# Patient Record
Sex: Female | Born: 1955 | Race: White | Hispanic: No | Smoking: Never smoker
Health system: Southern US, Community
[De-identification: ages and names within clinical notes are randomized; demographics above are authoritative.]

## PROBLEM LIST (undated history)

## (undated) DIAGNOSIS — R51 Headache: Secondary | ICD-10-CM

## (undated) DIAGNOSIS — F329 Major depressive disorder, single episode, unspecified: Secondary | ICD-10-CM

## (undated) DIAGNOSIS — I10 Essential (primary) hypertension: Secondary | ICD-10-CM

## (undated) DIAGNOSIS — E039 Hypothyroidism, unspecified: Secondary | ICD-10-CM

## (undated) DIAGNOSIS — F419 Anxiety disorder, unspecified: Secondary | ICD-10-CM

## (undated) DIAGNOSIS — F319 Bipolar disorder, unspecified: Secondary | ICD-10-CM

## (undated) DIAGNOSIS — Z86718 Personal history of other venous thrombosis and embolism: Secondary | ICD-10-CM

## (undated) DIAGNOSIS — R0602 Shortness of breath: Secondary | ICD-10-CM

## (undated) DIAGNOSIS — M1990 Unspecified osteoarthritis, unspecified site: Secondary | ICD-10-CM

## (undated) DIAGNOSIS — F209 Schizophrenia, unspecified: Secondary | ICD-10-CM

## (undated) DIAGNOSIS — J069 Acute upper respiratory infection, unspecified: Secondary | ICD-10-CM

## (undated) DIAGNOSIS — J189 Pneumonia, unspecified organism: Secondary | ICD-10-CM

## (undated) DIAGNOSIS — E079 Disorder of thyroid, unspecified: Secondary | ICD-10-CM

## (undated) DIAGNOSIS — E7800 Pure hypercholesterolemia, unspecified: Secondary | ICD-10-CM

## (undated) DIAGNOSIS — F32A Depression, unspecified: Secondary | ICD-10-CM

## (undated) DIAGNOSIS — R45851 Suicidal ideations: Secondary | ICD-10-CM

## (undated) DIAGNOSIS — Z5189 Encounter for other specified aftercare: Secondary | ICD-10-CM

## (undated) DIAGNOSIS — IMO0001 Reserved for inherently not codable concepts without codable children: Secondary | ICD-10-CM

## (undated) HISTORY — DX: Essential (primary) hypertension: I10

## (undated) HISTORY — PX: TUBAL LIGATION: SHX77

## (undated) HISTORY — DX: Pure hypercholesterolemia, unspecified: E78.00

## (undated) HISTORY — PX: APPENDECTOMY: SHX54

---

## 2001-09-15 ENCOUNTER — Emergency Department (HOSPITAL_COMMUNITY): Admission: EM | Admit: 2001-09-15 | Discharge: 2001-09-16 | Payer: Self-pay | Admitting: *Deleted

## 2001-09-16 ENCOUNTER — Encounter: Payer: Self-pay | Admitting: *Deleted

## 2001-09-16 ENCOUNTER — Ambulatory Visit (HOSPITAL_COMMUNITY): Admission: RE | Admit: 2001-09-16 | Discharge: 2001-09-16 | Payer: Self-pay | Admitting: *Deleted

## 2002-04-07 ENCOUNTER — Encounter: Payer: Self-pay | Admitting: Family Medicine

## 2002-04-07 ENCOUNTER — Encounter: Admission: RE | Admit: 2002-04-07 | Discharge: 2002-04-07 | Payer: Self-pay | Admitting: Family Medicine

## 2002-05-04 ENCOUNTER — Encounter: Admission: RE | Admit: 2002-05-04 | Discharge: 2002-05-22 | Payer: Self-pay | Admitting: Neurosurgery

## 2002-09-25 ENCOUNTER — Ambulatory Visit (HOSPITAL_COMMUNITY): Admission: RE | Admit: 2002-09-25 | Discharge: 2002-09-25 | Payer: Self-pay | Admitting: Neurology

## 2002-09-25 ENCOUNTER — Encounter: Payer: Self-pay | Admitting: Neurology

## 2006-04-12 ENCOUNTER — Ambulatory Visit: Payer: Self-pay | Admitting: Nurse Practitioner

## 2006-04-15 ENCOUNTER — Ambulatory Visit (HOSPITAL_COMMUNITY): Admission: RE | Admit: 2006-04-15 | Discharge: 2006-04-15 | Payer: Self-pay | Admitting: Family Medicine

## 2006-04-15 ENCOUNTER — Ambulatory Visit: Payer: Self-pay | Admitting: *Deleted

## 2006-04-29 ENCOUNTER — Ambulatory Visit: Payer: Self-pay | Admitting: Nurse Practitioner

## 2006-04-30 ENCOUNTER — Ambulatory Visit (HOSPITAL_COMMUNITY): Admission: RE | Admit: 2006-04-30 | Discharge: 2006-04-30 | Payer: Self-pay | Admitting: Family Medicine

## 2006-05-10 ENCOUNTER — Ambulatory Visit: Payer: Self-pay | Admitting: Nurse Practitioner

## 2006-06-05 ENCOUNTER — Ambulatory Visit: Payer: Self-pay | Admitting: Nurse Practitioner

## 2006-07-09 ENCOUNTER — Ambulatory Visit: Payer: Self-pay | Admitting: Nurse Practitioner

## 2006-07-15 ENCOUNTER — Ambulatory Visit: Payer: Self-pay | Admitting: Internal Medicine

## 2006-07-30 ENCOUNTER — Ambulatory Visit: Payer: Self-pay | Admitting: Nurse Practitioner

## 2007-01-01 ENCOUNTER — Ambulatory Visit: Payer: Self-pay | Admitting: Nurse Practitioner

## 2007-02-06 ENCOUNTER — Ambulatory Visit: Payer: Self-pay | Admitting: Nurse Practitioner

## 2007-02-17 ENCOUNTER — Ambulatory Visit: Payer: Self-pay | Admitting: Internal Medicine

## 2007-02-23 ENCOUNTER — Inpatient Hospital Stay (HOSPITAL_COMMUNITY): Admission: AD | Admit: 2007-02-23 | Discharge: 2007-03-03 | Payer: Self-pay | Admitting: Psychiatry

## 2007-02-23 ENCOUNTER — Ambulatory Visit: Payer: Self-pay | Admitting: Psychiatry

## 2007-02-23 ENCOUNTER — Emergency Department (HOSPITAL_COMMUNITY): Admission: EM | Admit: 2007-02-23 | Discharge: 2007-02-23 | Payer: Self-pay | Admitting: Emergency Medicine

## 2007-02-27 ENCOUNTER — Emergency Department (HOSPITAL_COMMUNITY): Admission: EM | Admit: 2007-02-27 | Discharge: 2007-02-27 | Payer: Self-pay | Admitting: Emergency Medicine

## 2007-04-02 ENCOUNTER — Ambulatory Visit: Payer: Self-pay | Admitting: Internal Medicine

## 2007-05-28 ENCOUNTER — Encounter (INDEPENDENT_AMBULATORY_CARE_PROVIDER_SITE_OTHER): Payer: Self-pay | Admitting: *Deleted

## 2007-06-11 ENCOUNTER — Emergency Department (HOSPITAL_COMMUNITY): Admission: EM | Admit: 2007-06-11 | Discharge: 2007-06-12 | Payer: Self-pay | Admitting: Emergency Medicine

## 2007-06-25 ENCOUNTER — Ambulatory Visit: Payer: Self-pay | Admitting: Internal Medicine

## 2007-07-23 ENCOUNTER — Encounter (INDEPENDENT_AMBULATORY_CARE_PROVIDER_SITE_OTHER): Payer: Self-pay | Admitting: Nurse Practitioner

## 2007-07-23 ENCOUNTER — Ambulatory Visit: Payer: Self-pay | Admitting: Family Medicine

## 2007-07-23 LAB — CONVERTED CEMR LAB
ALT: 14 units/L (ref 0–35)
AST: 15 units/L (ref 0–37)
Albumin: 4.3 g/dL (ref 3.5–5.2)
Basophils Absolute: 0 10*3/uL (ref 0.0–0.1)
Basophils Relative: 0 % (ref 0–1)
CO2: 25 meq/L (ref 19–32)
Calcium: 9.3 mg/dL (ref 8.4–10.5)
Chloride: 104 meq/L (ref 96–112)
Cholesterol: 206 mg/dL — ABNORMAL HIGH (ref 0–200)
Creatinine, Ser: 0.81 mg/dL (ref 0.40–1.20)
Hemoglobin: 12.6 g/dL (ref 12.0–15.0)
Lymphocytes Relative: 41 % (ref 12–46)
MCHC: 31.9 g/dL (ref 30.0–36.0)
Monocytes Absolute: 0.4 10*3/uL (ref 0.1–1.0)
Neutro Abs: 2.6 10*3/uL (ref 1.7–7.7)
Neutrophils Relative %: 49 % (ref 43–77)
Potassium: 4.3 meq/L (ref 3.5–5.3)
RDW: 13.1 % (ref 11.5–15.5)
Sodium: 140 meq/L (ref 135–145)
TSH: 7.034 microintl units/mL — ABNORMAL HIGH (ref 0.350–5.50)
Total CHOL/HDL Ratio: 5.3
Total Protein: 6.9 g/dL (ref 6.0–8.3)

## 2007-08-26 ENCOUNTER — Encounter (INDEPENDENT_AMBULATORY_CARE_PROVIDER_SITE_OTHER): Payer: Self-pay | Admitting: Nurse Practitioner

## 2007-08-26 ENCOUNTER — Ambulatory Visit: Payer: Self-pay | Admitting: Internal Medicine

## 2007-08-26 LAB — CONVERTED CEMR LAB: TSH: 0.254 microintl units/mL — ABNORMAL LOW (ref 0.350–5.50)

## 2007-09-13 ENCOUNTER — Emergency Department (HOSPITAL_COMMUNITY): Admission: EM | Admit: 2007-09-13 | Discharge: 2007-09-13 | Payer: Self-pay | Admitting: Emergency Medicine

## 2007-10-14 ENCOUNTER — Inpatient Hospital Stay (HOSPITAL_COMMUNITY): Admission: RE | Admit: 2007-10-14 | Discharge: 2007-10-21 | Payer: Self-pay | Admitting: Psychiatry

## 2007-10-14 ENCOUNTER — Ambulatory Visit: Payer: Self-pay | Admitting: Psychiatry

## 2007-12-10 ENCOUNTER — Inpatient Hospital Stay (HOSPITAL_COMMUNITY): Admission: RE | Admit: 2007-12-10 | Discharge: 2007-12-19 | Payer: Self-pay | Admitting: Psychiatry

## 2007-12-11 ENCOUNTER — Ambulatory Visit: Payer: Self-pay | Admitting: Psychiatry

## 2007-12-30 ENCOUNTER — Ambulatory Visit: Payer: Self-pay | Admitting: Internal Medicine

## 2007-12-30 ENCOUNTER — Encounter (INDEPENDENT_AMBULATORY_CARE_PROVIDER_SITE_OTHER): Payer: Self-pay | Admitting: Nurse Practitioner

## 2007-12-30 LAB — CONVERTED CEMR LAB: TSH: 0.503 microintl units/mL (ref 0.350–5.50)

## 2008-04-28 ENCOUNTER — Ambulatory Visit: Payer: Self-pay | Admitting: Internal Medicine

## 2009-02-01 ENCOUNTER — Ambulatory Visit: Payer: Self-pay | Admitting: Internal Medicine

## 2009-02-01 ENCOUNTER — Encounter (INDEPENDENT_AMBULATORY_CARE_PROVIDER_SITE_OTHER): Payer: Self-pay | Admitting: Internal Medicine

## 2009-02-01 LAB — CONVERTED CEMR LAB
ALT: 21 units/L (ref 0–35)
Albumin: 4.3 g/dL (ref 3.5–5.2)
Alkaline Phosphatase: 70 units/L (ref 39–117)
Basophils Absolute: 0 10*3/uL (ref 0.0–0.1)
Basophils Relative: 0 % (ref 0–1)
CO2: 24 meq/L (ref 19–32)
Hemoglobin: 12.7 g/dL (ref 12.0–15.0)
Lymphocytes Relative: 29 % (ref 12–46)
MCHC: 34.3 g/dL (ref 30.0–36.0)
Monocytes Relative: 6 % (ref 3–12)
Neutro Abs: 4.5 10*3/uL (ref 1.7–7.7)
Neutrophils Relative %: 62 % (ref 43–77)
Potassium: 4.4 meq/L (ref 3.5–5.3)
RBC: 4.42 M/uL (ref 3.87–5.11)
Sodium: 143 meq/L (ref 135–145)
Total Bilirubin: 0.3 mg/dL (ref 0.3–1.2)
Total Protein: 6.6 g/dL (ref 6.0–8.3)

## 2009-03-30 ENCOUNTER — Emergency Department (HOSPITAL_COMMUNITY): Admission: EM | Admit: 2009-03-30 | Discharge: 2009-04-02 | Payer: Self-pay | Admitting: Emergency Medicine

## 2009-04-02 ENCOUNTER — Inpatient Hospital Stay (HOSPITAL_COMMUNITY): Admission: AD | Admit: 2009-04-02 | Discharge: 2009-04-20 | Payer: Self-pay | Admitting: Psychiatry

## 2009-04-02 ENCOUNTER — Ambulatory Visit: Payer: Self-pay | Admitting: Psychiatry

## 2009-04-06 IMAGING — CR DG CHEST 2V
2 series · 2 of 2 positions shown · non-contrast
Comparison: none

HISTORY: Altered speech and mobility, question stroke, history hypertension

CHEST 2 VIEWS:
No prior study for comparison.
Upper normal heart size.
Normal mediastinal contours and pulmonary vascularity.
Eventration anterior right diaphragm.
No pulmonary infiltrate or pleural effusion.
Minimal peribronchial thickening.
Bones unremarkable.

[w chest pa]
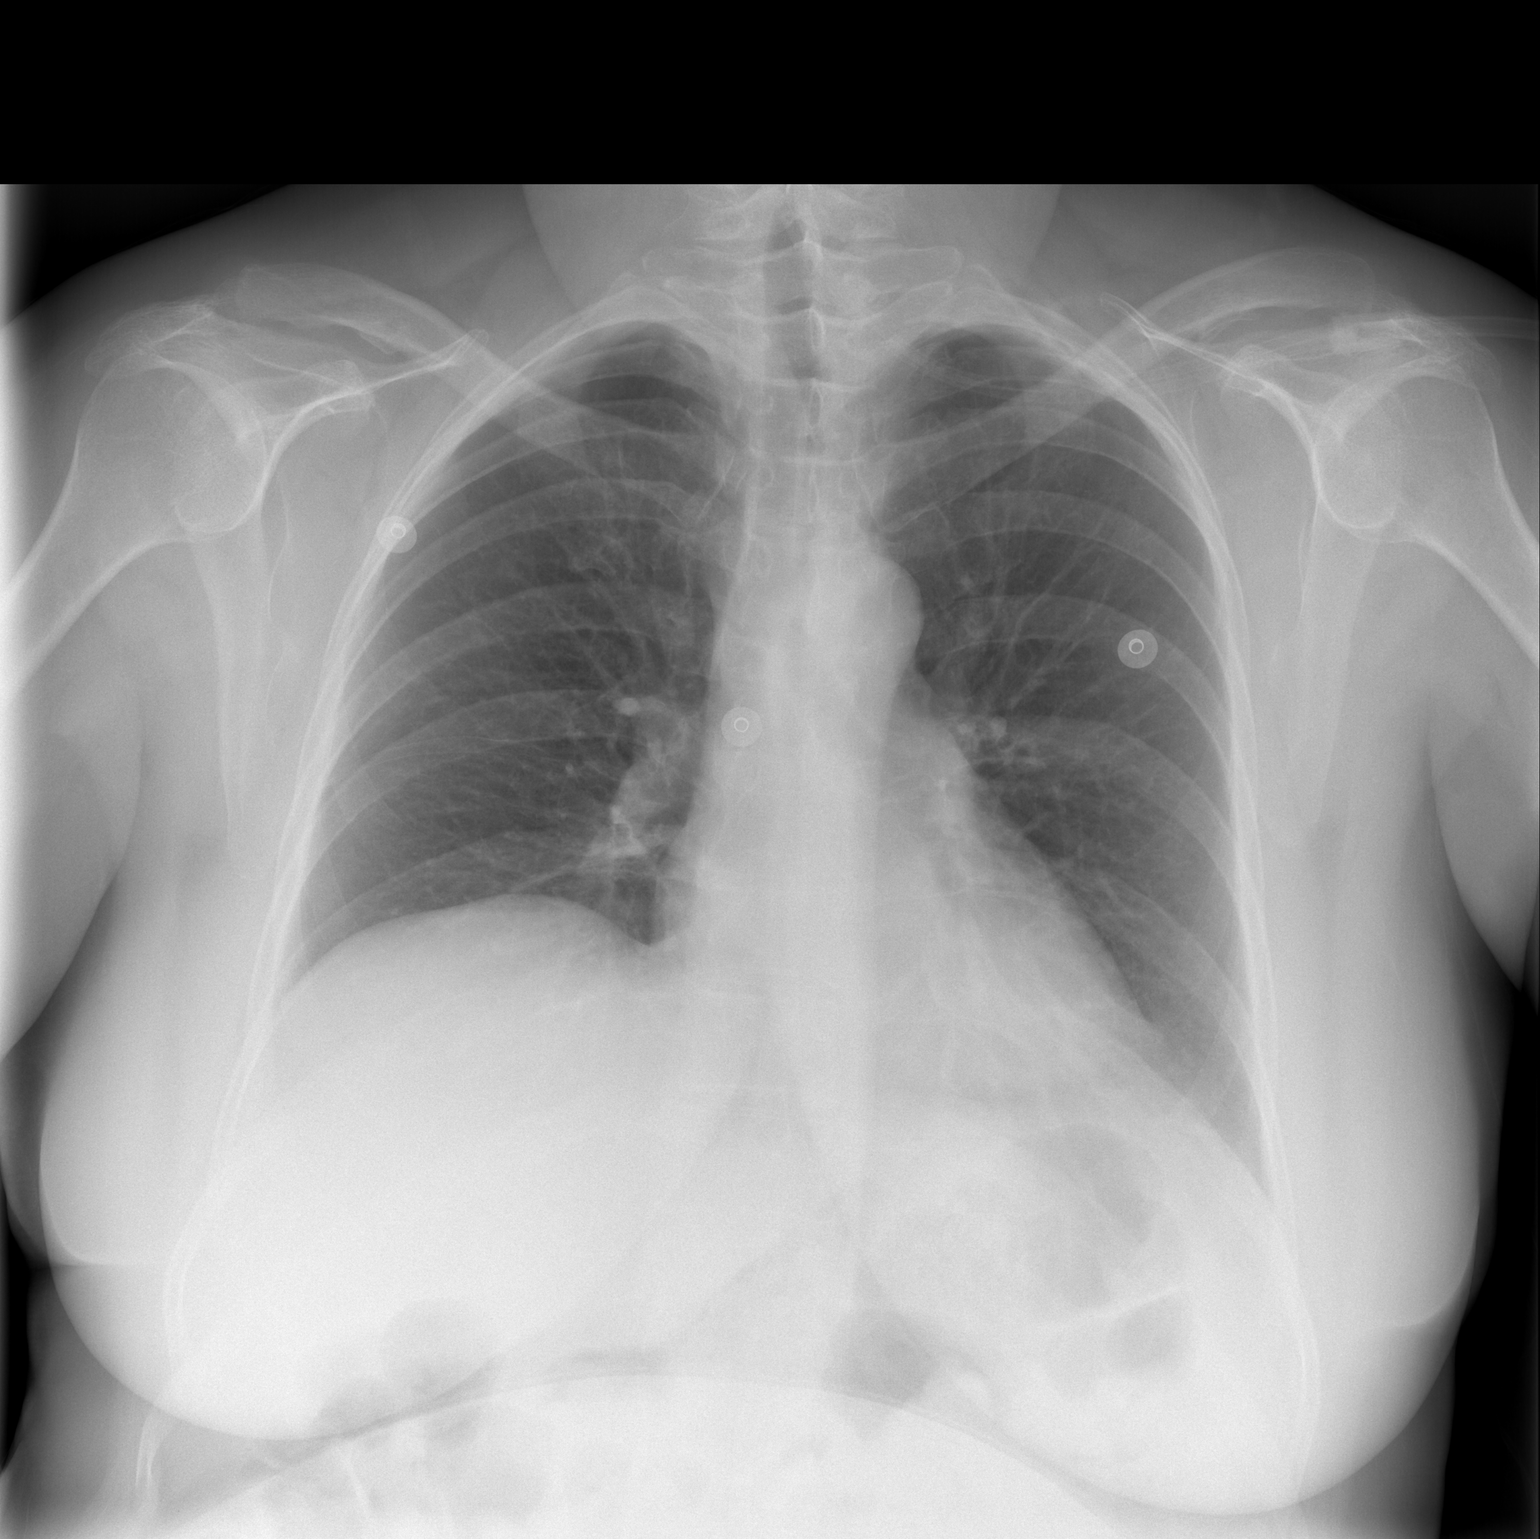

[w chest lat]
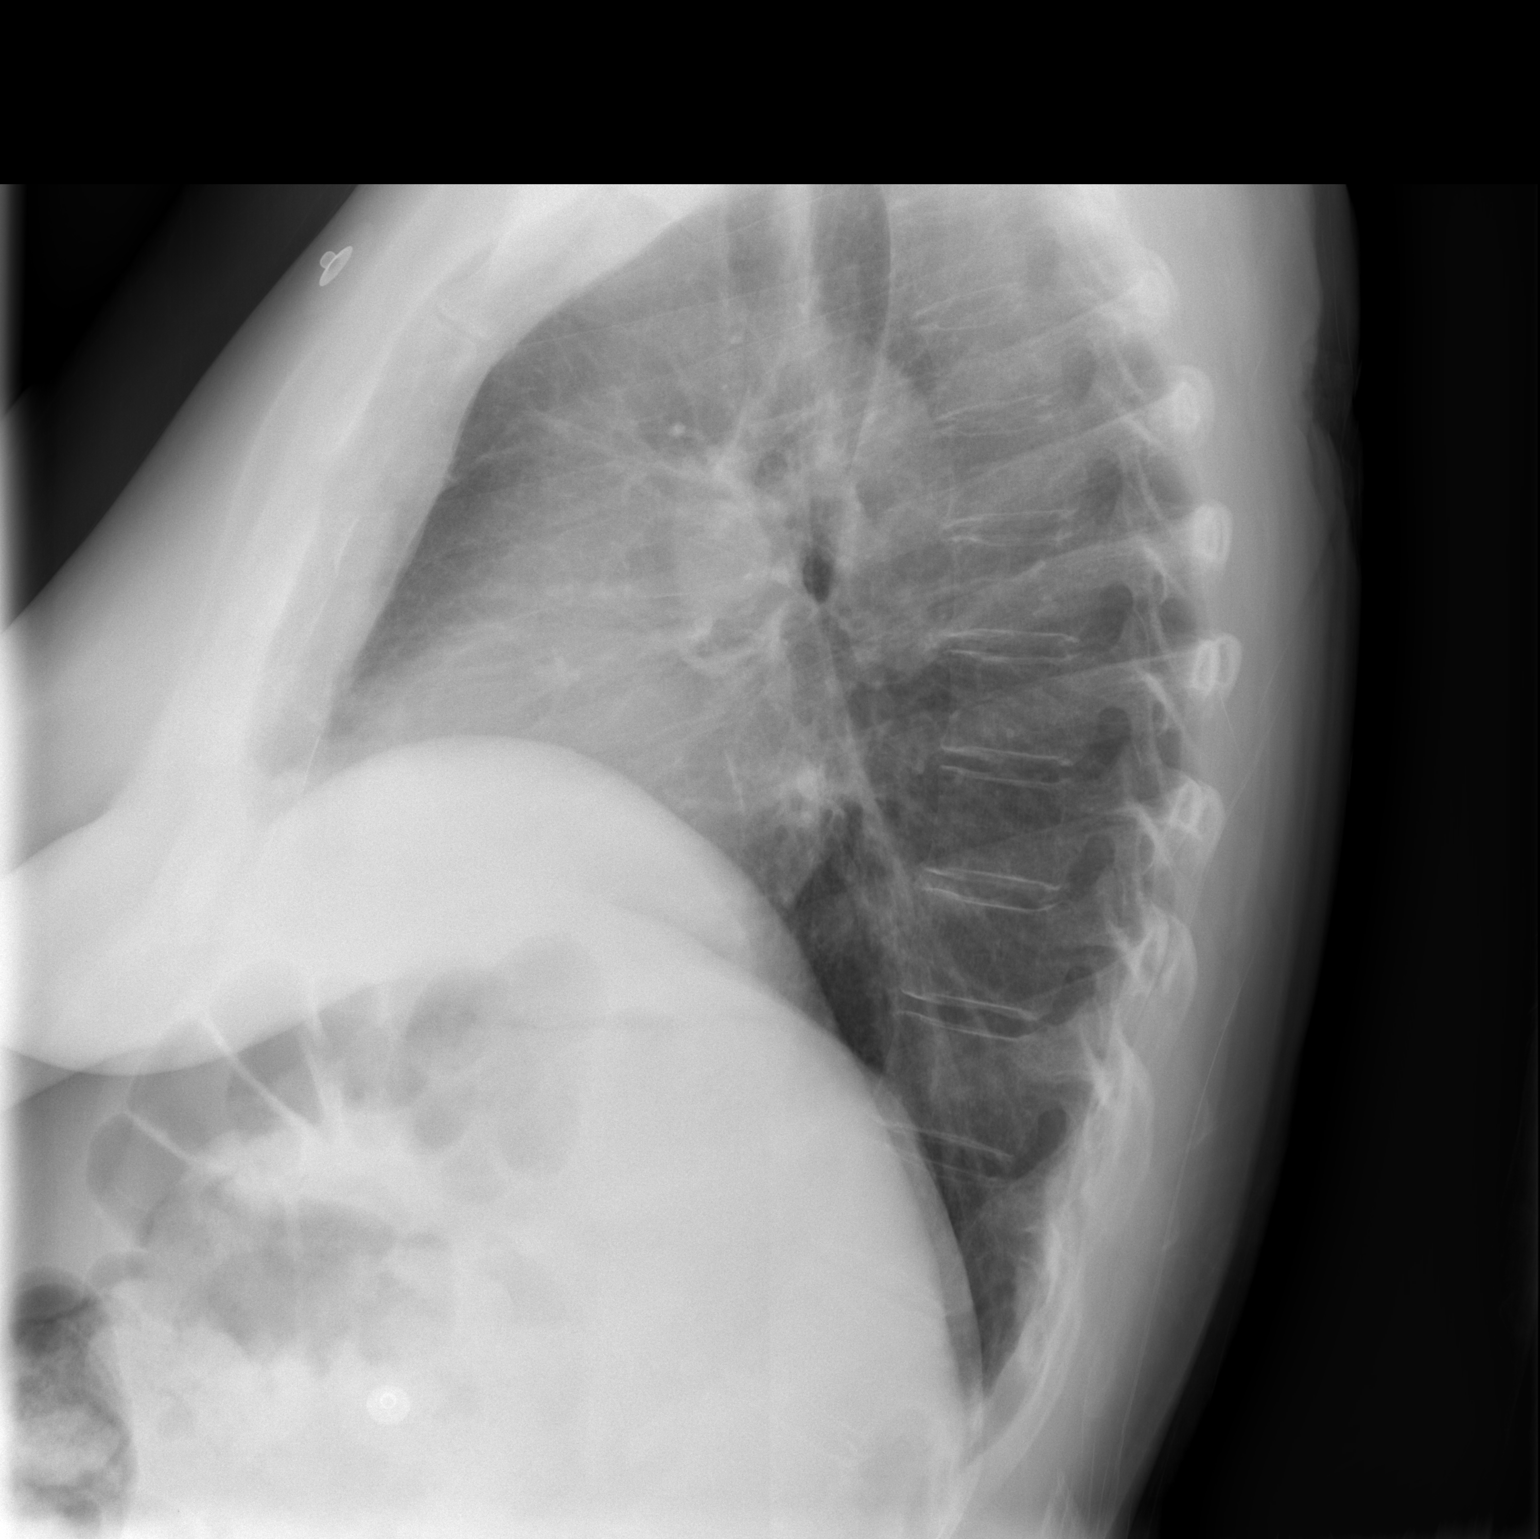

[2 of 2 positions shown; findings below may reference images not displayed]

IMPRESSION: Minimal bronchitic changes.

## 2009-04-25 ENCOUNTER — Ambulatory Visit: Payer: Self-pay | Admitting: Internal Medicine

## 2009-06-27 ENCOUNTER — Encounter (INDEPENDENT_AMBULATORY_CARE_PROVIDER_SITE_OTHER): Payer: Self-pay | Admitting: Internal Medicine

## 2009-06-27 ENCOUNTER — Ambulatory Visit: Payer: Self-pay | Admitting: Internal Medicine

## 2009-08-30 ENCOUNTER — Encounter (INDEPENDENT_AMBULATORY_CARE_PROVIDER_SITE_OTHER): Payer: Self-pay | Admitting: Internal Medicine

## 2009-08-30 ENCOUNTER — Ambulatory Visit: Payer: Self-pay | Admitting: Internal Medicine

## 2009-08-30 LAB — CONVERTED CEMR LAB: TSH: 8.641 microintl units/mL — ABNORMAL HIGH (ref 0.350–4.500)

## 2009-09-19 ENCOUNTER — Ambulatory Visit: Payer: Self-pay | Admitting: Internal Medicine

## 2009-09-19 LAB — CONVERTED CEMR LAB: TSH: 2.271 microintl units/mL (ref 0.350–4.500)

## 2009-11-07 ENCOUNTER — Other Ambulatory Visit: Payer: Self-pay

## 2009-11-07 ENCOUNTER — Other Ambulatory Visit: Payer: Self-pay | Admitting: Emergency Medicine

## 2009-11-08 ENCOUNTER — Inpatient Hospital Stay (HOSPITAL_COMMUNITY): Admission: RE | Admit: 2009-11-08 | Discharge: 2009-11-18 | Payer: Self-pay | Admitting: Psychiatry

## 2009-11-08 ENCOUNTER — Other Ambulatory Visit: Payer: Self-pay | Admitting: Emergency Medicine

## 2009-11-08 ENCOUNTER — Ambulatory Visit: Payer: Self-pay | Admitting: Psychiatry

## 2009-12-01 ENCOUNTER — Other Ambulatory Visit: Payer: Self-pay

## 2009-12-01 ENCOUNTER — Ambulatory Visit: Payer: Self-pay | Admitting: Psychiatry

## 2009-12-01 ENCOUNTER — Inpatient Hospital Stay (HOSPITAL_COMMUNITY): Admission: AD | Admit: 2009-12-01 | Discharge: 2009-12-12 | Payer: Self-pay | Admitting: Psychiatry

## 2010-05-02 ENCOUNTER — Other Ambulatory Visit: Payer: Self-pay | Admitting: Emergency Medicine

## 2010-05-02 ENCOUNTER — Ambulatory Visit: Payer: Self-pay | Admitting: Psychiatry

## 2010-05-03 ENCOUNTER — Inpatient Hospital Stay (HOSPITAL_COMMUNITY): Admission: RE | Admit: 2010-05-03 | Discharge: 2010-05-11 | Payer: Self-pay | Admitting: Psychiatry

## 2010-05-11 ENCOUNTER — Other Ambulatory Visit: Payer: Self-pay | Admitting: Emergency Medicine

## 2010-05-12 ENCOUNTER — Inpatient Hospital Stay (HOSPITAL_COMMUNITY): Admission: EM | Admit: 2010-05-12 | Discharge: 2010-05-14 | Payer: Self-pay | Admitting: Psychiatry

## 2010-05-14 ENCOUNTER — Inpatient Hospital Stay (HOSPITAL_COMMUNITY): Admission: EM | Admit: 2010-05-14 | Discharge: 2010-05-19 | Payer: Self-pay | Admitting: Emergency Medicine

## 2010-05-16 ENCOUNTER — Ambulatory Visit: Payer: Self-pay | Admitting: Psychiatry

## 2010-06-07 ENCOUNTER — Inpatient Hospital Stay (HOSPITAL_COMMUNITY): Admission: EM | Admit: 2010-06-07 | Discharge: 2010-06-09 | Payer: Self-pay | Admitting: Emergency Medicine

## 2010-06-22 ENCOUNTER — Ambulatory Visit (HOSPITAL_COMMUNITY): Admission: RE | Admit: 2010-06-22 | Discharge: 2010-06-22 | Payer: Self-pay | Admitting: Internal Medicine

## 2010-06-25 ENCOUNTER — Emergency Department (HOSPITAL_COMMUNITY): Admission: EM | Admit: 2010-06-25 | Discharge: 2010-06-25 | Payer: Self-pay | Admitting: Emergency Medicine

## 2010-06-29 ENCOUNTER — Ambulatory Visit (HOSPITAL_COMMUNITY): Admission: RE | Admit: 2010-06-29 | Discharge: 2010-06-29 | Payer: Self-pay | Admitting: Internal Medicine

## 2010-09-07 ENCOUNTER — Ambulatory Visit (HOSPITAL_COMMUNITY)
Admission: RE | Admit: 2010-09-07 | Discharge: 2010-09-07 | Payer: Self-pay | Source: Home / Self Care | Attending: Internal Medicine | Admitting: Internal Medicine

## 2010-11-20 NOTE — H&P (Signed)
NAME:  Cheryl [name], Cheryl [name]                   ACCOUNT NO.:  [id number]  MEDICAL RECORD NO.:  [id number]          PATIENT TYPE:  EMS  LOCATION:  ED                           FACILITY:  [facility]  PHYSICIAN:  Donalynn Furlong, MD      DATE OF BIRTH:  10-Jun-1956  DATE OF ADMISSION:  05/14/2010 DATE OF DISCHARGE:                             HISTORY & PHYSICAL  PRIMARY CARE PROVIDER:  Unknown.  ADMITTING TEAM:  [facility] hospitalist Team #1.  CHIEF COMPLAINT:  Hypoxia, shortness of breath, fever.  HISTORY OF PRESENT ILLNESS:  Ms. Tortorelli is a transfer from [facility].  She is in [facility] for suicidal ideation, depression, auditory hallucination.  She was found to be hypoxic today afternoon with low-grade fever and chills.  She was brought to emergency room.  The patient mentions that she also had some cough.  She denies any sputum production and she denies any chest pain. She also had mild shortness of breath.  The patient denies any eye problems, ear problems.  She has headache.  She denies any gastrointestinal, genitourinary symptoms at this time.  Past medical history includes hypothyroidism, back pain, auditory hallucination, depression, suicidal ideation.  PAST SURGICAL HISTORY:  As per past medical history.  SOCIAL HISTORY:  The patient lives with her boyfriend in [place].  No recent alcohol, drug, or tobacco use.  FAMILY HISTORY:  Nothing contributory to the current problem.  REVIEW OF SYSTEMS:  Positive as per HPI, otherwise negative review of systems done for 14 systems.  Home medication list from [facility] includes __________, olanzapine, levothyroxine, ibuprofen, bupropion SR, Celexa, Claritin, docusate, gabapentin, trazodone.  Allergies to medications include SULFA.  PHYSICAL EXAM:  VITAL SIGNS:  As per chart, pulse 120 per minute, respirations 18 per minute, temperature 99.8 Fahrenheit, oxygen saturation 93% on 2 liters  oxygen. GENERAL:  Alert, oriented x3.  Lying in bed in moderate distress. CARDIOVASCULAR:  S1, S2 regular.  The patient is tachycardic. LUNGS:  Clear to auscultation on the left side there are some crackles on the right side. ABDOMEN:  Nontender, nondistended.  Bowel sounds present. EXTREMITIES:  No clubbing, cyanosis, or edema.  __________ in all 4 extremities. HEAD:  Normocephalic, atraumatic. EYES:  Pupils equally light and accommodation.  Extraocular muscles intact. __________ dry.  No thrush noted. NECK:  No thyromegaly or JVD. SKIN:  No rash or bruise. NEUROLOGICAL:  Intact cranial nerves, muscular strength, sensation, reflexes.  Lab work done in the emergency room today shows EKG with sinus tachycardia and no acute ST-T changes. CT chest PE protocol shows right upper and lower lobe infiltrate and inflammatory lymph nodes in mediastinum.  Urinalysis unremarkable for infection.  Basic metabolic panel shows glucose 132.  __________ 1.08. CBC with differential shows a hemoglobin 12.0.  Chest x-ray with right lower lung base opacity with atelectasis and/or pneumonia. CT head without contrast unremarkable.  ASSESSMENT AND PLAN: 1. Right lung upper and lower lobe pneumonia most likely hospital-     acquired. 2. Suicidal ideation. 3. Depression. 4. Auditory hallucination. 5. Hypothyroidism. 6. Back pain.  PLAN:  Will admit patient with a diagnosis of pneumonia, suicidal attempt on telemetry bed under [facility].  Will keep her full code.  Will check vital signs q.4 h.  We will keep her on oxygen to keep her saturation more than 90.  We will put one-on-one sitter.  We will provide her heart-healthy diet.  Will check CBC, CMP, cardiac enzymes, urinalysis, urine culture, blood culture, HIV antibody, urine Strep pneumo antigen, urine Legionella antigen, sputum Gram stain and culture. We will provide normal saline 50 mL per hour for hydration.  Will provide Lovenox for  DVT prophylaxis.  Will provide Zofran p.r.n. for nausea, vomiting.  Will provide albuterol ipratropium q.6 h. and p.r.n. for any shortness of breath.  We will provide Tylenol p.r.n. for fever. Will provide Lortab p.r.n. for pain.  Will start piperacillin/tazobactam and vancomycin for pneumonia treatment at this time.  The patient will be continued on all psych meds.     Donalynn Furlong, MD    TVP/MEDQ  D:  05/14/2010  T:  05/14/2010  Job:  [id number]  Electronically Signed by Virginia Rochester M.D. on 11/20/2010 12:07:25 PM

## 2010-11-23 LAB — POCT I-STAT, CHEM 8
BUN: 18 mg/dL (ref 6–23)
Chloride: 105 mEq/L (ref 96–112)
Creatinine, Ser: 1.2 mg/dL (ref 0.4–1.2)
Hemoglobin: 12.9 g/dL (ref 12.0–15.0)
Potassium: 4.6 mEq/L (ref 3.5–5.1)
Sodium: 138 mEq/L (ref 135–145)

## 2010-11-23 LAB — CK TOTAL AND CKMB (NOT AT ARMC)
CK, MB: 0.8 ng/mL (ref 0.3–4.0)
CK, MB: 1.2 ng/mL (ref 0.3–4.0)
CK, MB: 0.8 ng/mL (ref 0.3–4.0)
Relative Index: INVALID (ref 0.0–2.5)
Total CK: 48 U/L (ref 7–177)
Total CK: 49 U/L (ref 7–177)
Total CK: 53 U/L (ref 7–177)

## 2010-11-23 LAB — CBC
HCT: 31 % — ABNORMAL LOW (ref 36.0–46.0)
HCT: 35.4 % — ABNORMAL LOW (ref 36.0–46.0)
Hemoglobin: 11.7 g/dL — ABNORMAL LOW (ref 12.0–15.0)
Hemoglobin: 11.4 g/dL — ABNORMAL LOW (ref 12.0–15.0)
Hemoglobin: 12 g/dL (ref 12.0–15.0)
MCH: 28.6 pg (ref 26.0–34.0)
MCH: 28.7 pg (ref 26.0–34.0)
MCHC: 33.7 g/dL (ref 30.0–36.0)
MCHC: 34 g/dL (ref 30.0–36.0)
MCV: 85.8 fL (ref 78.0–100.0)
MCV: 84.5 fL (ref 78.0–100.0)
MCV: 84.8 fL (ref 78.0–100.0)
MCV: 85.2 fL (ref 78.0–100.0)
MCV: 85.3 fL (ref 78.0–100.0)
Platelets: 263 10*3/uL (ref 150–400)
Platelets: 266 10*3/uL (ref 150–400)
Platelets: 278 10*3/uL (ref 150–400)
Platelets: 294 10*3/uL (ref 150–400)
RBC: 4.09 MIL/uL (ref 3.87–5.11)
RBC: 3.47 MIL/uL — ABNORMAL LOW (ref 3.87–5.11)
RBC: 4.16 MIL/uL (ref 3.87–5.11)
RDW: 14.4 % (ref 11.5–15.5)
RDW: 13.4 % (ref 11.5–15.5)
RDW: 13.7 % (ref 11.5–15.5)
WBC: 12 10*3/uL — ABNORMAL HIGH (ref 4.0–10.5)
WBC: 5.2 10*3/uL (ref 4.0–10.5)
WBC: 6 10*3/uL (ref 4.0–10.5)

## 2010-11-23 LAB — HEPATIC FUNCTION PANEL
ALT: 34 U/L (ref 0–35)
Alkaline Phosphatase: 62 U/L (ref 39–117)
Bilirubin, Direct: <0.1 mg/dL (ref 0.0–0.3)

## 2010-11-23 LAB — DIFFERENTIAL
Basophils Absolute: 0 10*3/uL (ref 0.0–0.1)
Basophils Absolute: 0 10*3/uL (ref 0.0–0.1)
Basophils Relative: 0 % (ref 0–1)
Basophils Relative: 0 % (ref 0–1)
Eosinophils Relative: 1 % (ref 0–5)
Eosinophils Relative: 3 % (ref 0–5)
Eosinophils Relative: 5 % (ref 0–5)
Lymphocytes Relative: 15 % (ref 12–46)
Lymphocytes Relative: 8 % — ABNORMAL LOW (ref 12–46)
Lymphocytes Relative: 16 % (ref 12–46)
Lymphocytes Relative: 28 % (ref 12–46)
Lymphs Abs: 1.8 10*3/uL (ref 0.7–4.0)
Lymphs Abs: 0.8 10*3/uL (ref 0.7–4.0)
Lymphs Abs: 1.6 10*3/uL (ref 0.7–4.0)
Monocytes Absolute: 0.6 10*3/uL (ref 0.1–1.0)
Monocytes Absolute: 0.3 10*3/uL (ref 0.1–1.0)
Monocytes Relative: 4 % (ref 3–12)
Monocytes Relative: 5 % (ref 3–12)
Neutro Abs: 9.4 10*3/uL — ABNORMAL HIGH (ref 1.7–7.7)
Neutro Abs: 3.4 10*3/uL (ref 1.7–7.7)
Neutro Abs: 3.8 10*3/uL (ref 1.7–7.7)
Neutrophils Relative %: 87 % — ABNORMAL HIGH (ref 43–77)

## 2010-11-23 LAB — BASIC METABOLIC PANEL
BUN: 11 mg/dL (ref 6–23)
BUN: 6 mg/dL (ref 6–23)
CO2: 30 mEq/L (ref 19–32)
Chloride: 102 mEq/L (ref 96–112)
Chloride: 106 mEq/L (ref 96–112)
Chloride: 107 mEq/L (ref 96–112)
Creatinine, Ser: 0.89 mg/dL (ref 0.4–1.2)
Creatinine, Ser: 0.91 mg/dL (ref 0.4–1.2)
GFR calc Af Amer: >60 mL/min (ref >60)
GFR calc Af Amer: >60 mL/min (ref >60)
GFR calc non Af Amer: >60 mL/min (ref >60)
GFR calc non Af Amer: >60 mL/min (ref >60)
Glucose, Bld: 121 mg/dL — ABNORMAL HIGH (ref 70–99)
Potassium: 4.1 mEq/L (ref 3.5–5.1)
Potassium: 4.2 mEq/L (ref 3.5–5.1)
Potassium: 4.3 mEq/L (ref 3.5–5.1)
Sodium: 139 mEq/L (ref 135–145)

## 2010-11-23 LAB — D-DIMER, QUANTITATIVE: D-Dimer, Quant: 1.08 ug/mL-FEU — ABNORMAL HIGH (ref 0.00–0.48)

## 2010-11-23 LAB — EXPECTORATED SPUTUM ASSESSMENT W GRAM STAIN, RFLX TO RESP C

## 2010-11-23 LAB — COMPREHENSIVE METABOLIC PANEL
AST: 51 U/L — ABNORMAL HIGH (ref 0–37)
Albumin: 3.2 g/dL — ABNORMAL LOW (ref 3.5–5.2)
Alkaline Phosphatase: 72 U/L (ref 39–117)
BUN: 13 mg/dL (ref 6–23)
BUN: 11 mg/dL (ref 6–23)
CO2: 25 mEq/L (ref 19–32)
Calcium: 8.8 mg/dL (ref 8.4–10.5)
Creatinine, Ser: 0.88 mg/dL (ref 0.4–1.2)
GFR calc Af Amer: >60 mL/min (ref >60)
GFR calc non Af Amer: >60 mL/min (ref >60)
Potassium: 3.9 mEq/L (ref 3.5–5.1)
Total Bilirubin: 0.3 mg/dL (ref 0.3–1.2)
Total Protein: 6.2 g/dL (ref 6.0–8.3)

## 2010-11-23 LAB — LEGIONELLA ANTIGEN, URINE

## 2010-11-23 LAB — CULTURE, BLOOD (ROUTINE X 2)
Culture  Setup Time: 201109290353
Culture  Setup Time: 201109290353
Culture: NO GROWTH
Culture: NO GROWTH

## 2010-11-23 LAB — CULTURE, RESPIRATORY W GRAM STAIN

## 2010-11-23 LAB — URINE CULTURE: Special Requests: POSITIVE

## 2010-11-23 LAB — URINE MICROSCOPIC-ADD ON

## 2010-11-23 LAB — URINALYSIS, ROUTINE W REFLEX MICROSCOPIC
Glucose, UA: 100 mg/dL — AB
Glucose, UA: 100 mg/dL — AB
Hgb urine dipstick: NEGATIVE
Hgb urine dipstick: NEGATIVE
Ketones, ur: NEGATIVE mg/dL
Nitrite: NEGATIVE
Specific Gravity, Urine: 1.015 (ref 1.005–1.030)
Specific Gravity, Urine: 1.013 (ref 1.005–1.030)
Specific Gravity, Urine: 1.019 (ref 1.005–1.030)
Urobilinogen, UA: 1 mg/dL (ref 0.0–1.0)
pH: 7 (ref 5.0–8.0)

## 2010-11-23 LAB — HIV ANTIBODY (ROUTINE TESTING W REFLEX): HIV: NONREACTIVE

## 2010-11-23 LAB — TROPONIN I
Troponin I: 0.01 ng/mL (ref 0.00–0.06)
Troponin I: 0.01 ng/mL (ref 0.00–0.06)

## 2010-11-23 LAB — RAPID URINE DRUG SCREEN, HOSP PERFORMED: Barbiturates: NOT DETECTED

## 2010-11-23 LAB — HEMOGLOBIN: Hemoglobin: 10 g/dL — ABNORMAL LOW (ref 12.0–15.0)

## 2010-11-23 LAB — GLUCOSE, CAPILLARY: Glucose-Capillary: 180 mg/dL — ABNORMAL HIGH (ref 70–99)

## 2010-11-24 LAB — RAPID URINE DRUG SCREEN, HOSP PERFORMED
Barbiturates: NOT DETECTED
Benzodiazepines: POSITIVE — AB

## 2010-11-24 LAB — HEPATIC FUNCTION PANEL
ALT: 38 U/L — ABNORMAL HIGH (ref 0–35)
AST: 31 U/L (ref 0–37)
Alkaline Phosphatase: 69 U/L (ref 39–117)
Indirect Bilirubin: 0.5 mg/dL (ref 0.3–0.9)
Total Bilirubin: 0.6 mg/dL (ref 0.3–1.2)
Total Protein: 6.5 g/dL (ref 6.0–8.3)

## 2010-11-24 LAB — CBC
HCT: 40 % (ref 36.0–46.0)
Hemoglobin: 13.5 g/dL (ref 12.0–15.0)
RBC: 4.72 MIL/uL (ref 3.87–5.11)
WBC: 7.9 10*3/uL (ref 4.0–10.5)

## 2010-11-24 LAB — URINALYSIS, ROUTINE W REFLEX MICROSCOPIC
Glucose, UA: NEGATIVE mg/dL
Ketones, ur: NEGATIVE mg/dL
Protein, ur: NEGATIVE mg/dL

## 2010-11-24 LAB — BASIC METABOLIC PANEL
GFR calc non Af Amer: >60 mL/min (ref >60)
Glucose, Bld: 108 mg/dL — ABNORMAL HIGH (ref 70–99)
Potassium: 4 mEq/L (ref 3.5–5.1)
Sodium: 139 mEq/L (ref 135–145)

## 2010-11-24 LAB — TSH
TSH: 8.318 u[IU]/mL — ABNORMAL HIGH (ref 0.350–4.500)
TSH: 9.402 u[IU]/mL — ABNORMAL HIGH (ref 0.350–4.500)

## 2010-11-24 LAB — URINE MICROSCOPIC-ADD ON

## 2010-11-24 LAB — ETHANOL: Alcohol, Ethyl (B): 5 mg/dL (ref 0–10)

## 2010-11-24 LAB — DIFFERENTIAL
Lymphocytes Relative: 29 % (ref 12–46)
Monocytes Absolute: 0.5 10*3/uL (ref 0.1–1.0)
Monocytes Relative: 6 % (ref 3–12)
Neutro Abs: 4.9 10*3/uL (ref 1.7–7.7)

## 2010-11-24 LAB — LIPID PANEL: VLDL: 52 mg/dL — ABNORMAL HIGH (ref 0–40)

## 2010-11-24 LAB — ACETAMINOPHEN LEVEL: Acetaminophen (Tylenol), Serum: <10 ug/mL — ABNORMAL LOW (ref 10–30)

## 2010-11-29 LAB — CBC
Hemoglobin: 11.7 g/dL — ABNORMAL LOW (ref 12.0–15.0)
MCHC: 33.6 g/dL (ref 30.0–36.0)
MCV: 84 fL (ref 78.0–100.0)
RDW: 14 % (ref 11.5–15.5)

## 2010-11-29 LAB — DIFFERENTIAL
Eosinophils Absolute: 0.1 10*3/uL (ref 0.0–0.7)
Lymphocytes Relative: 20 % (ref 12–46)
Lymphs Abs: 2.6 10*3/uL (ref 0.7–4.0)
Monocytes Relative: 6 % (ref 3–12)
Neutrophils Relative %: 73 % (ref 43–77)

## 2010-11-29 LAB — RAPID URINE DRUG SCREEN, HOSP PERFORMED
Amphetamines: NOT DETECTED
Cocaine: NOT DETECTED
Opiates: NOT DETECTED
Tetrahydrocannabinol: NOT DETECTED

## 2010-11-29 LAB — COMPREHENSIVE METABOLIC PANEL
ALT: 26 U/L (ref 0–35)
Calcium: 8.7 mg/dL (ref 8.4–10.5)
Creatinine, Ser: 1.13 mg/dL (ref 0.4–1.2)
GFR calc Af Amer: >60 mL/min (ref >60)
Glucose, Bld: 134 mg/dL — ABNORMAL HIGH (ref 70–99)
Sodium: 142 mEq/L (ref 135–145)
Total Protein: 6.4 g/dL (ref 6.0–8.3)

## 2010-11-29 LAB — T4, FREE: Free T4: 1.3 ng/dL (ref 0.80–1.80)

## 2010-11-29 LAB — TSH: TSH: 3.844 u[IU]/mL (ref 0.350–4.500)

## 2010-12-03 LAB — CBC
MCHC: 32.2 g/dL (ref 30.0–36.0)
MCV: 84.2 fL (ref 78.0–100.0)
Platelets: 317 10*3/uL (ref 150–400)
RBC: 4.43 MIL/uL (ref 3.87–5.11)
WBC: 7.1 10*3/uL (ref 4.0–10.5)

## 2010-12-03 LAB — COMPREHENSIVE METABOLIC PANEL
ALT: 31 U/L (ref 0–35)
AST: 25 U/L (ref 0–37)
Albumin: 3.8 g/dL (ref 3.5–5.2)
CO2: 26 mEq/L (ref 19–32)
Calcium: 8.8 mg/dL (ref 8.4–10.5)
Chloride: 106 mEq/L (ref 96–112)
Creatinine, Ser: 0.93 mg/dL (ref 0.4–1.2)
GFR calc Af Amer: >60 mL/min (ref >60)
Sodium: 139 mEq/L (ref 135–145)

## 2010-12-03 LAB — RAPID URINE DRUG SCREEN, HOSP PERFORMED
Amphetamines: NOT DETECTED
Benzodiazepines: NOT DETECTED
Tetrahydrocannabinol: NOT DETECTED

## 2010-12-03 LAB — URINALYSIS, ROUTINE W REFLEX MICROSCOPIC
Glucose, UA: NEGATIVE mg/dL
Hgb urine dipstick: NEGATIVE
Ketones, ur: NEGATIVE mg/dL
Protein, ur: NEGATIVE mg/dL
pH: 5.5 (ref 5.0–8.0)

## 2010-12-03 LAB — DIFFERENTIAL
Eosinophils Absolute: 0.3 10*3/uL (ref 0.0–0.7)
Eosinophils Relative: 4 % (ref 0–5)
Lymphocytes Relative: 24 % (ref 12–46)
Lymphs Abs: 1.7 10*3/uL (ref 0.7–4.0)
Monocytes Absolute: 0.7 10*3/uL (ref 0.1–1.0)

## 2010-12-03 LAB — URINE CULTURE
Colony Count: NO GROWTH
Special Requests: NEGATIVE

## 2010-12-03 LAB — URINE MICROSCOPIC-ADD ON

## 2010-12-17 LAB — COMPREHENSIVE METABOLIC PANEL
Alkaline Phosphatase: 69 U/L (ref 39–117)
BUN: 9 mg/dL (ref 6–23)
Chloride: 108 mEq/L (ref 96–112)
Glucose, Bld: 104 mg/dL — ABNORMAL HIGH (ref 70–99)
Potassium: 4.2 mEq/L (ref 3.5–5.1)
Total Bilirubin: 0.5 mg/dL (ref 0.3–1.2)
Total Protein: 6.6 g/dL (ref 6.0–8.3)

## 2010-12-17 LAB — DIFFERENTIAL
Basophils Absolute: 0 10*3/uL (ref 0.0–0.1)
Basophils Relative: 1 % (ref 0–1)
Neutro Abs: 4.3 10*3/uL (ref 1.7–7.7)
Neutrophils Relative %: 61 % (ref 43–77)

## 2010-12-17 LAB — URINALYSIS, ROUTINE W REFLEX MICROSCOPIC
Ketones, ur: NEGATIVE mg/dL
Nitrite: NEGATIVE
Specific Gravity, Urine: 1.004 — ABNORMAL LOW (ref 1.005–1.030)
pH: 6 (ref 5.0–8.0)

## 2010-12-17 LAB — SALICYLATE LEVEL: Salicylate Lvl: <4 mg/dL (ref 2.8–20.0)

## 2010-12-17 LAB — RAPID URINE DRUG SCREEN, HOSP PERFORMED
Barbiturates: NOT DETECTED
Cocaine: NOT DETECTED
Opiates: NOT DETECTED

## 2010-12-17 LAB — TSH: TSH: 0.077 u[IU]/mL — ABNORMAL LOW (ref 0.350–4.500)

## 2010-12-17 LAB — ETHANOL: Alcohol, Ethyl (B): <5 mg/dL (ref 0–10)

## 2010-12-17 LAB — CBC
HCT: 38.5 % (ref 36.0–46.0)
Hemoglobin: 12.9 g/dL (ref 12.0–15.0)
RDW: 12.9 % (ref 11.5–15.5)

## 2010-12-17 LAB — ACETAMINOPHEN LEVEL: Acetaminophen (Tylenol), Serum: <10 ug/mL — ABNORMAL LOW (ref 10–30)

## 2011-01-23 NOTE — H&P (Signed)
NAME:  Cheryl [name], Cheryl [name]                   ACCOUNT NO.:  [id number]   MEDICAL RECORD NO.:  [id number]          PATIENT TYPE:  IPS   LOCATION:  0405                          [facility]ACILITY:  [facility]   PHYSICIAN:  Geoffery Lyons, M.D.      DATE OF BIRTH:  1956/04/19   DATE OF ADMISSION:  12/10/2007  DATE OF DISCHARGE:                       PSYCHIATRIC ADMISSION ASSESSMENT   IDENTIFYING INFORMATION:  A 55 year old white female.  This is a  voluntary admission.   HISTORY OF PRESENT ILLNESS:  This patient was referred by mental health,  where she presented for her routine appointment.  She had also been  previously seen March 27 in their emergency services program.  She  reported that her thinking was confused, waking up a lot at night,  unable to sleep.  Reported that she was hearing voices, voices telling  her that she just needs to go ahead and hurt herself or that she is  going to die.  Confused about person, place and situation and referred  by therapist for an inpatient hospital admission.  They were also  concerned about her basic safety.  Apparently she lives with a boyfriend  who travels from time to time and there is concern that she is not  taking her medications regularly.  On initial assessment she was  observed as seeing people in the room that were not present.  She denies  any intent to hurt herself today, although she endorses continued  auditory hallucinations.   PAST PSYCHIATRIC HISTORY:  The patient is followed by Dr. Tiburcio Pea at  [facility] and also by [facility] of the  [place].  This is her fourth [facility] admission with her first admission in  1999.  She also has prior admissions June 15-23. 2008, and most recently  February 3-10. 2009.  She has been previously diagnosed with  schizophrenia and is referred from mental health with a diagnosis of  major depression, recurrent, severe, with psychotic features.   SOCIAL HISTORY:  Actual marital status is unclear.  She  apparently lives  with her boyfriend in an independent living situation.  His name is  Adele Schilder.  Contact information is in the record.  They live  together in [place], [place], and she also has a daughter who  lives in town and the record reflects that she has possibly recently had  a new grandchild.   FAMILY HISTORY:  Not available.   ALCOHOL AND DRUG HISTORY:  No history of substance abuse.   MEDICAL HISTORY:  The patient is followed by Duke Salvia, nurse  practitioner, at [facility].  Medical problems are  hypertension, hypothyroidism and migraine headaches and a history of  back pain from a herniated disks.   Current medications were validated with [facility] and with mental  health and are:  1. Klonopin 0.5 mg b.i.d.  2. Trazodone 100 mg 2-3 tablets nightly.  3. Maxalt 10 mg p.r.n. onset of migraine.  4. Levothyroxine 150 mcg daily.  5. Neurontin 300 mg t.i.d.  6. Percocet 5/325 mg one p.o. q.i.d. p.r.n. for back pain.  7. Hydrochlorothiazide 12.5 mg daily.  8. Allegra 180 mg daily.  9. B12 injection twice a month, and her schedule is not clear.  10.Abilify 10 mg daily.  11.Lithium carbonate 300 mg t.i.d.  12.Invega 9 mg daily.   DRUG ALLERGIES:  SULFA.   Physical exam of was done here on the unit.  It is noted in the record.  This is a disheveled-appearing white female who is in no distress,  pleasant, directable.  Five feet 6 inches tall, 170 pounds.  Temperature 98.2, pulse 88,  respirations 16, blood pressure 121/81.  She is oriented to person only and place.  Knows that she is in the  hospital.  Carlyon Prows it is the month of February and 1 day off on day.  Motor is smooth.  Gait is normal.  Full exam noted in the record.   Diagnostic studies were done here on the unit.  CBC:  WBC 7.9,  hemoglobin 12.3, hematocrit 35.2, platelets 365,000.  Chemistry:  Sodium  134, potassium 3.3, chloride 100, carbon dioxide 23, BUN 13, creatinine  1.06 and  random glucose 121.  Liver enzymes:  SGOT 18, SGPT 16, alkaline  phosphatase 66, total bilirubin 0.6, calcium 9.5.  Her TSH is 31.7.  Lithium level 0.87.  Routine urinalysis and a urine drug screen are  pending.   MENTAL STATUS EXAM:  She is fully alert, disheveled, up walking in the  hall, pleasant.  Smooth motor.  Good balance even with challenge and a  normal gait with normal arm swing.  Casually dressed, wearing a thick  bathrobe over her clothing.  Affect is pleasant and greets me with a  smile.  Speech is decreased in production but normal in pace and tone.  She does not appear to be a particularly stimulated today.  Mood is  neutral.  She knows she is in the hospital but does not know why.  When  asked directly her boyfriend's name she cannot give it to me, although  she mentions it later on.  Cannot tell me who her primary care physician  is or where she gets her medications.  Needs direction and prompting.  Endorses having auditory hallucinations and says that they have been  telling her that she is just going to die, though she denies any intent  to harm herself today.  Denies intent to harm anyone else.  Cognition:  Immediate memory is impaired, immediate, recent and remote.  Insight  impaired.  Impulse control within normal limits.  Judgment is impaired.   AXIS I:  Psychosis , not otherwise specified.  2.  Rule out major  depressive disorder with psychosis.  3.  Rule out delirium, not  otherwise specified.  AXIS II:  Deferred.  AXIS III:  1.  Migraine headache.  2.  Back pain.  3.  Hypothyroidism.  AXIS IV:  Severe, issues with independent functioning in her home  setting.  AXIS V:  Current 46, past year not known.   PLAN:  Voluntarily admit the patient with every 15-minute checks in  place.  We have placed her in our intensive care program with a goal of  alleviating the auditory hallucinations and improving her contact with  reality.  We are going to check a free T4, T3  and a repeat TSH and  meanwhile have a validated her medications with [facility].  We will get a urine specimen for routine urinalysis and UDS  and at this point are continuing her regular medications and  will  coordinate with her primary care practitioner as needed.  We are  considering a referral to ACT Team to help with close monitoring and  evaluation of the home setting.  Estimated length of stay is 5 days.      Margaret A. Scott, N.P.      Geoffery Lyons, M.D.  Electronically Signed    MAS/MEDQ  D:  12/11/2007  T:  12/11/2007  Job:  [id number]

## 2011-01-23 NOTE — H&P (Signed)
NAME:  Cheryl [name], Cheryl [name]                   ACCOUNT NO.:  [id number]   MEDICAL RECORD NO.:  [id number]          PATIENT TYPE:  IPS   LOCATION:  0406                          FACILITY:  [facility]   PHYSICIAN:  Anselm Jungling, MD  DATE OF BIRTH:  1956/04/12   DATE OF ADMISSION:  10/14/2007  DATE OF DISCHARGE:                       PSYCHIATRIC ADMISSION ASSESSMENT   This is a 55 year old single female who was voluntarily admitted  October 14, 2007.   HISTORY OF PRESENT ILLNESS:  The patient presents with psychotic  symptoms.  She has been hearing voices for the past 2 weeks; hearing  three different voices: One of her brother, the second is her  stepdaughter, and another voice that is telling her to hurt herself.  She denies any specific stressors.  She states she has been compliant  with her medications.  She denies any substance use.   PAST PSYCHIATRIC HISTORY:  This is the second admission to the  [facility]  She sees Dr. Allyne Gee for outpatient mental  health services.   SOCIAL HISTORY:  This is a 55 year old single female, three children all  grown.  The patient lives with her boy friend.  She is unemployed, and  denies any legal problems.   FAMILY HISTORY:  None.   ALCOHOL/DRUG HISTORY:  Nonsmoker.  Denies any alcohol or drug use.   PRIMARY CARE Kariann Wecker:  Unknown.   MEDICAL PROBLEMS:  Reports some chronic back pain.   MEDICATIONS:  The patient provides a long list of medications.  1. Klonopin 0.5 mg b.i.d.  2. Trazodone 100 bedtime.  3. Effexor 150 mg 2 tabs daily.  4. Synthroid 100 mcg daily.  5. Neurontin 300 mg t.i.d.  6. Percocet 5/325 q.i.d. p.r.n.  7. Hydrochlorothiazide 12.5 mg daily.  8. Allegra 100 mg daily.  9. Abilify 5 mg daily.  10.Furosemide 20 mg daily.  11.Cogentin 1 mg daily.  12.Lithium carbonate 300 mg t.i.d.  13.Maxalt 10 mg at onset of headache.  14.Invega 9 mg daily.  15.Celexa 20 mg 1-1/2 tablets daily.  16.Flexeril twice monthly  injection.   DRUG ALLERGIES:  SULFA.   REVIEW OF SYSTEMS:  Positive for back pain, positive for hallucinations.   PHYSICAL EXAMINATION:  VITAL SIGNS:  Temperature is 97.2; 89 heart rate;  60 respirations; blood pressure 108/71.  She is 5 feet 7 inches tall, 76  kg.  GENERAL:  This is an overweight middle-aged female, again, resting in  bed, in no acute distress.  HEAD:  Atraumatic.  NECK:  Negative, without lymphadenopathy.  Trachea is midline.  CHEST:  Clear, no wheezes or rales.  BREAST EXAM:  Deferred.  HEART:  Regular rate and rhythm.  No murmurs, gallops or rubs.  ABDOMEN:  Soft, nontender abdomen.  PELVIC AND GENITOURINARY EXAM:  Deferred.  EXTREMITIES:  Moves all extremities 5+ against resistance.  No clubbing,  no edema.  All pulses are palpable 2+.  SKIN:  Warm and dry.  No rashes or lacerations.  NEUROLOGICAL FINDINGS:  Cranial nerves II-XII are intact.  No tics, no  tremors, no weakness.   TSH is 0.528,  glucose of 115.  CBC within normal limits.  MENTAL STATUS EXAM:  She is sleepy.  She is in the bed.  She awakens  with her name called.  Falls asleep readily when not stimulated.  Speech  is soft-spoken.  Mood is neutral; the patient, again, is flat.  Thought  process:  Endorsing hallucinations.  Denies any homicidal thoughts.  No  delusional thinking.  Cognition intact.  Her memory is fair.  Judgment  and insight:  Fair.   DIAGNOSES:  AXIS I:  Schizophrenia, chronic paranoid acute exacerbation.  AXIS II:  Deferred.  AXIS III:  Chronic back pain.  AXIS IV:  Other psychosocial problems related to burden of illness, and  possible problems related to her back problems.  AXIS V:  Current is 35.   PLAN:  To stabilize her mood and thinking.  We will clarify medications,  contact mental health for most recent medications.  We will increase the  patient's Abilify to decrease psychotic symptoms.  Will have a family  session with her boyfriend who she identifies as support.   Will continue  to reinforce medication compliance.  The case manager is to obtain her  followup appointments.   TENTATIVE LENGTH OF STAY:  3-5 days.      Landry Corporal, N.P.      Anselm Jungling, MD  Electronically Signed    JO/MEDQ  D:  10/16/2007  T:  10/17/2007  Job:  [id number]

## 2011-01-23 NOTE — H&P (Signed)
NAME:  Cheryl [name], Cheryl [name]                   ACCOUNT NO.:  [id number]   MEDICAL RECORD NO.:  [id number]          PATIENT TYPE:  IPS   LOCATION:  0400                          FACILITY:  [facility]   PHYSICIAN:  Anselm Jungling, MD  DATE OF BIRTH:  02-16-1956   DATE OF ADMISSION:  04/02/2009  DATE OF DISCHARGE:                       PSYCHIATRIC ADMISSION ASSESSMENT   This is a voluntary admission to the services of Dr. Geralyn Flash.  A  55 year old divorced white female.  She presented to [facility]  Emergency Department on March 30, 2009, about 12:30 in the afternoon.  She reported my mother appears to me and wants me to be with her so I am  thinking I might cut my wrists so I can go be with her.  Her affect was  flat.  She was tearful at times.  The ACT counselor who visits her home  3 times a week recommended that she come in for evaluation.  Due to the  bed situation being complicated by the fact that her daughter was  actually an inpatient here, her admission to the Behavioral Health Unit  was delayed until her daughter was discharged.  The patient states  another recent stressor was issues with her Medicaid.  She was issued  some back pay and somehow this negated her current Medicaid.  The  patient was last with [place] December 09, 2008, to December 18, 2008.  She is well  known to [place] and is actually followed on the ACT team by Dr. Electa Sniff.   FAMILY HISTORY:  As already stated, her daughter was just here with  serious drug abuse issues and rhabdomyolysis.   ALCOHOL AND DRUG HISTORY:  She has been clean and sober for a while.   PRIMARY CARE PHYSICIAN:  Is through [facility].   MEDICAL PROBLEMS:  She is known to have:  1. Hypertension.  2. Migraines.  3. Hypothyroid.  4. Back pain.   MEDICATIONS:  She reports that she is currently prescribed:  1. Levoxyl 112 mcg a day.  2. Klonopin 0.5 mg p.o. b.i.d. although her urine drug screen was      negative for benzos.  3. Gabapentin 300 mg p.o.  t.i.d.  4. Zyprexa 10 mg p.o. b.i.d.  5. Lexapro 10 mg p.o. daily.  6. Abilify 30 mg p.o. daily.   POSITIVE PHYSICAL FINDINGS:  She was medically cleared in the ED at  [facility].  As already stated, her UDS was completely negative.  Her  CBC had no abnormal findings nor did her UA.  Her chemistry showed a  slightly elevated glucose at 104.  Her vital signs showed a range in  temperature from 97.6 to 98.3.  Pulse was 85 to 112.  Respirations 18 to  20.  Blood pressure was 108/75 to 120/77.   MENTAL STATUS EXAM:  Se is alert and oriented.  She is appropriately  groomed, dressed, and nourished.  Her speech is a normal rate, rhythm,  and tone.  Her mood is somewhat depressed.  Her affect is somewhat flat.  Thought processes are clear, rational, and  goal oriented.  Judgment and  insight are good.  Concentration and memory are intact.  She is not  actively suicidal or homicidal at this time.  She reports that she is  only having auditory hallucinations at this time, no visual  hallucinations.  She states that the auditory hallucinations never go  away totally even when her mood is normal.  When stable, they are way in  the background.  They have exacerbated due to recent stressors.   DIAGNOSES:  AXIS I:  1. Psychotic disorder, not otherwise specified, with hallucinations,      rule out schizoaffective disorder.  2. Depressed with psychotic features.  AXIS II:  Rule out personality disorder.  AXIS III:  1. Migraine headaches.  2. Back pain.  3. Hypothyroidism.  AXIS IV:  Economic and social issues.  AXIS V:  Is 30.   PLAN:  Admit for safety and stabilization.  While in the emergency  department, she was seen by Dr. Jeanie Sewer on April 01, 2009, who  recommended increasing the Lexapro to 20 mg p.o. daily and that will be  done.   ESTIMATED LENGTH OF STAY:  Is 3 to 5 days.  She already is active with  the ACT team and will continue with them at discharge.      Mickie Leonarda Salon,  P.A.-C.      Anselm Jungling, MD  Electronically Signed    MD/MEDQ  D:  04/03/2009  T:  04/03/2009  Job:  [id number]

## 2011-01-23 NOTE — Discharge Summary (Signed)
NAME:  DONNELL, BEAUCHAMP                   ACCOUNT NO.:  [id number]   MEDICAL RECORD NO.:  [id number]          PATIENT TYPE:  IPS   LOCATION:  0307                          FACILITY:  [facility]   PHYSICIAN:  Anselm Jungling, MD  DATE OF BIRTH:  07/10/1956   DATE OF ADMISSION:  04/02/2009  DATE OF DISCHARGE:  04/20/2009                               DISCHARGE SUMMARY   IDENTIFYING DATA AND REASON FOR ADMISSION:  This was an inpatient  psychiatric admission for Cheryl [name], Cheryl 55 year [name], Cheryl [name], Cheryl  [name], who was admitted due to exacerbation of symptoms of  schizoaffective disorder.  Please refer to the admission note for  further details pertaining to the symptoms, circumstances, and history  that led to her hospitalization.  She was given an initial Axis I  diagnosis of schizoaffective disorder, NOS.   MEDICAL AND LABORATORY:  The patient came to [place] with Cheryl history of  chronic pain and hypothyroid.  Her thyroid indices were measured and  although her TSH level was very low her T3 and T4 were low as well.  This presented Cheryl confusing picture in terms of appropriate management of  her supplemental thyroid hormone.  It was determined that she would need  to see her usual medical physician regarding guidance pertaining to this  following her discharge.  She was continued on her usual Levoxyl 112 mcg  daily.  Aside from this, the patient had significant sciatica pain  during her stay.  This was treated with Cheryl gabapentin and Voltaren EC 75  mg b.i.d.  She had mild relief in symptoms with these medications.  There were no other significant medical issues.   HOSPITAL COURSE:  Patient was admitted to the adult inpatient  psychiatric service.  She presented as Cheryl well-nourished, normally-  developed, adult [name] who was extremely depressed, sad, and reporting  suicidal thoughts but no specific plan or intention.  She described  auditory hallucinations telling her that she would kill herself.  Aside  from this, she was in good contact with reality.  Her level of insight  was good and she verbalized Cheryl strong desire for help.   She had been followed by the local assertive community treatment team  and we communicated with their staff regarding the patient's progress  and aftercare planning.   The patient's response to treatment was gradual but satisfactory.  She  required Cheryl 2-week hospital stay for full stabilization and safe  discharge.  At the time of discharge, she agreed to the following  aftercare plan.   AFTERCARE:  Patient was to follow up with the ACT team, followup to  begin immediately upon discharge.   DISCHARGE MEDICATIONS:  1. Levoxyl 112 mcg daily.  2. Zyprexa 10 mg b.i.d. and 20 mg q.h.s.  3. Gabapentin 300 mg 3 times daily.  4. Abilify 30 mg q.h.s.  5. Lexapro 30 mg daily.  6. Voltaren EC 55 mg t.i.d.  7. Colace 100 mg b.i.d.   The patient was instructed to follow up with her medical physician  regarding thyroid supplementation.   DISCHARGE DIAGNOSES:  AXIS I:  Schizoaffective disorder, most recently  depressed, with psychotic features, resolving.  AXIS II:  Deferred.  AXIS III:  Sciatica, hypothyroidism.  AXIS IV:  Stressors severe.  AXIS V:  Global Assessment of Functioning on discharge 60.      Anselm Jungling, MD  Electronically Signed     SPB/MEDQ  D:  04/25/2009  T:  04/25/2009  Job:  [id number]

## 2011-01-23 NOTE — Discharge Summary (Signed)
NAME:  Cheryl [name], Cheryl [name]                   ACCOUNT NO.:  [id number]   MEDICAL RECORD NO.:  [id number]          PATIENT TYPE:  IPS   LOCATION:  0503                          FACILITY:  [facility]   PHYSICIAN:  Geoffery Lyons, M.D.      DATE OF BIRTH:  May 17, 1956   DATE OF ADMISSION:  02/23/2007  DATE OF DISCHARGE:  03/03/2007                               DISCHARGE SUMMARY   CHIEF COMPLAINT AND PRESENT ILLNESS:  This was the first admission to  [facility] for this 55 year old divorced white female  voluntarily admitted.  History of depression, auditory hallucinations,  claims she has them all the time.  Endorsed racing thoughts, people  fighting her.  Was going to take a knife and push it in her chest.  Not  eating well.  Trouble remembering things.  Noncompliance with  medications.   PAST PSYCHIATRIC HISTORY:  First time at [facility] in 1999.  Claims she had meltdown.  Was going to kill two people, herself and her  husband.   ALCOHOL/DRUG HISTORY:  Denies active use of any substances.   MEDICAL HISTORY:  Herniated disk, migraines, edema of the feet.   MEDICATIONS:  Celexa 30 mg per day, Valium 5 mg three times a day,  Maxalt.   PHYSICAL EXAMINATION:  Performed and failed to show any acute findings.   LABORATORY DATA:  White blood cells 8.3, hemoglobin 13.5, sodium 138,  potassium 3.8, glucose 104, SGOT 19, SGPT 21, total bilirubin 0.7, TSH  18.866.   MENTAL STATUS EXAM:  Female, alert, cooperative, casually dressed.  Minimal eye contact.  Speech clear, normal rate, tempo and production.  Mood anxious, depressed.  Affect depressed.  Thought processes logical,  coherent and relevant.  Endorsing delusional ideas as well as  hallucinations.  Cognition well-preserved.   ADMISSION DIAGNOSES:  AXIS I:  Rule out major depression with psychotic  features.  AXIS II:  No diagnosis.  AXIS III:  Herniated disks, hypothyroidism and chronic pain.  AXIS IV:  Moderate.  AXIS V:   GAF upon admission 35; highest GAF in the last year 60.   HOSPITAL COURSE:  She was admitted.  She was started in individual and  group psychotherapy.  She was initially maintained on the Celexa and the  Valium and she was given Vicodin for the pain, Allegra 180 mg per day,  furosemide 20 mg per day and Neurontin 300 mg three times a day.  She  did endorsed that she had been overwhelmed with all that was going on,  did not feel safe, not the same person, isolating, withdrawal, not  smiling, bad dreams about people trying to get her, hears people in the  house arguing, endorsed they are scary, bad people.  She endorsed sister  died a couple of weeks ago, lost her job, voices, feeling that they are  trying to get rid of her.  In 1999, a nervous breakdown, was in [facility].  She was on Effexor at that particular time.  Endorsed chronic  back pain, muscle spasms.  Sleep was an issue.  On February 25, 2007, woke  up early.  Did not go back to sleep and still depressed, endorsing  hallucinations.  Positive for paranoia, feeling anxious, panicky around  people but also feeling that these people were going to hurt her.  Endorsed that the episode in 1999 was very similar.  Does not remember  much.  She was given Seroquel but it was very sedating.  By the 18th,  she was still endorsing difficulty with sleep, hallucinations, extreme  anxiety, paranoia, feeling that these people are going to hurt her.  Increased isolation secondary to fear at home as she was isolating,  withdrawing and avoiding due to the same.  Worked with the Risperdal  that we increased as she was able to tolerate it.  We also worked to  increase reality testing.  There was a family session with a boyfriend  who seemed to be supporting.  They have been together for eight years.  Endorsed frequent calls from both of her daughters, also estranged  relationship with her oldest daughter that troubles her.  There was some   concerns about headache and some change in drooping in right eye.  She  was assessed and the conclusion was sinus infection.  Continued to  endorse the voices, the paranoia.  We continued to work with the  Risperdal.  On February 28, 2007 was experiencing anxiety, the feeling that  she was having some panic attacks.  We tried the Klonopin for the  anxiety and there was no evidence of substance abuse.  Her thyroid  medication was resumed as she had discontinued it.  She was able to talk  about the sister dying several weeks prior to this admission.  Four  years before that, the brother committed suicide.  Continued to be  somewhat somatically focused.  She started to objectively look better.  On March 03, 2007, she was in full contact with reality.  Endorsed that  the voices were gone.  Felt she was ready to go.  The thyroid was going  to be reassessed by her primary physician.  Endorsed she was not going  to hurt herself and endorsed willingness to pursue outpatient follow-up.   DISCHARGE DIAGNOSES:  AXIS I:  Major depression with psychotic features.  AXIS II:  No diagnosis.  AXIS III:  Herniated disk, hypothyroidism, chronic pain.  AXIS IV:  Moderate.  AXIS V:  GAF upon discharge 50.   DISCHARGE MEDICATIONS:  1. Trazodone 50 mg at night.  2. Celexa 30 mg per day.  3. Claritin 10 mg per day.  4. Lasix 20 mg per day.  5. Neurontin 300 mg three times a day.  6. Synthroid 100 mcg daily.  7. Augmentin 875 mg twice a day for seven days.  8. Risperdal M-Tab 1 mg at bedtime.  9. Klonopin 0.5 mg, 1 twice a day.  10.Vicodin 5/325 mg, 1 every four hours as needed for pain.   Encouraged not to take anymore Valium.   FOLLOWUP:  [facility].      Geoffery Lyons, M.D.  Electronically Signed     IL/MEDQ  D:  03/21/2007  T:  03/23/2007  Job:  [id number]

## 2011-01-23 NOTE — Consult Note (Signed)
NAME:  Cheryl [name], Cheryl [name]                   ACCOUNT NO.:  [id number]   MEDICAL RECORD NO.:  [id number]          PATIENT TYPE:  EMS   LOCATION:  ED                           FACILITY:  [facility]   PHYSICIAN:  Antonietta Breach, M.D.  DATE OF BIRTH:  1956/04/02   DATE OF CONSULTATION:  04/01/2009  DATE OF DISCHARGE:                                 CONSULTATION   REASON FOR CONSULTATION:  Depression, psychosis.   REQUESTING PHYSICIAN:  Lobbyist.   HISTORY OF PRESENT ILLNESS:  Ms. Lyndee Herbst is a 55 year old female who  has presented to the [facility] with depression and auditory  hallucinations and suicidal thoughts.   Her daughter experienced an exacerbation of mental symptoms  approximately 1 week ago.  This was associated with a drop in Ms. Kokesh's  mood as well.  She has poor energy and difficulty concentrating.  She  has experienced a severe increase in the volume of her auditory  hallucinations and hears her deceased mother asking her to kill herself.  She does have suicidal thoughts.  She has been having plans to cut her  wrists.  However, she has insight into her psychiatric condition and  wants to get help.   She has intact memory function as well as orientation function.  She is  cooperative with staff.   PAST PSYCHIATRIC HISTORY:  She was started on Lexapro for depression  approximately 3 months ago at 10 mg daily, and this was associated with  improvement in her depression until approximately 1 week ago as an  outpatient.  She also has been maintained on Abilify 30 mg q.h.s.,  Zyprexa 10 mg b.i.d. as well as Neurontin 300 mg t.i.d.   She states that the auditory hallucinations never totally go away, even  when her mood is normal.  However, they are very far in the background  during her stable state.  Now they have become severe, as above.   She has a history of multiple psychiatric admissions.   FAMILY PSYCHIATRIC HISTORY:  None known.   SOCIAL  HISTORY:  She does have a supportive boyfriend.  Three children.  Please see the above discussion.  She is not using any alcohol or  illegal drugs.   PAST MEDICAL HISTORY:  Hypothyroidism, migraine headaches, chronic back  pain.   PAST PSYCHIATRIC HISTORY:  She does have a history of suicide attempt.   MEDICATIONS:  Abilify 30 mg q.h.s., Klonopin 0.5 mg b.i.d., Lexapro 10  mg daily, Neurontin 3 mg t.i.d., Synthroid 112 mcg daily, Zyprexa 10 mg  b.i.d.   She is allergic to SULFA.   LABORATORY DATA:  Alcohol negative.  Tylenol negative.  Aspirin  negative.  Sodium 141, BUN 9, creatinine 0.9, glucose 104, SGOT 29, SGPT  36.  WBC 7.1, hemoglobin 12.9, platelet count 289.  Urine drug screen is  unremarkable.   REVIEW OF SYSTEMS:  Noncontributory.   PHYSICAL EXAMINATION:  VITAL SIGNS:  Temperature 97.8, pulse 87,  respiratory rate 16, blood pressure 115/79.  GENERAL APPEARANCE:  Ms. Arvanitis is a middle-aged female lying in a  right  lateral decubitus position in her hospital bed with no abnormal  involuntary movements.   MENTAL STATUS EXAM:  Eye contact is intermittent.  Her attention span is  mildly decreased.  Her affect is very constricted with tears.  Mood is  depressed.  Concentration mildly decreased.  She is oriented to all  spheres.  Her memory is intact to immediate, recent, and remote.  Fund  of knowledge and intelligence normal.  Speech is soft with a slightly  flat prosody.  There is no dysarthria.  Thought process is coherent.  Thought content:  She has suicidal thoughts, as above.  She has auditory  hallucinations, as above.  Her insight is intact for her severe  depression as well as the auditory hallucinations being symptoms.  Her  judgment is impaired.   ASSESSMENT:   AXIS I:  1. 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  2. Rule out, 295.70, schizoaffective disorder.  3. Depressed with psychotic features.   AXIS II:  None.    AXIS III:  See past medical history.   AXIS IV:  Primary support group.   AXIS V:  20.   The undersigned provided education.   The indications, alternatives and adverse effects of increasing her  Lexapro were discussed for anti-depression, including inducing mania as  a risk.  She understands and would like to proceed with increasing the  Lexapro.   She also understands the indications, alternatives and adverse effects  of her antipsychotic regimen and her other psychotropics and wants to  continue those as they are.   The antipsychotic regimen of combining 2 antipsychotics has been  required to get the volume of the auditory hallucinations to the minimal  level experienced during her non-depressed day.   RECOMMENDATIONS:  As discussed above.  She would like to proceed and  will increase the Lexapro to 20 mg daily.  Will continue with the  combination of Zyprexa and Abilify as above for anti psychosis and mood  stabilization.  Low stimulation ego support.  Admit to an inpatient  psychiatric unit as soon as possible.      Antonietta Breach, M.D.  Electronically Signed     JW/MEDQ  D:  04/01/2009  T:  04/01/2009  Job:  [id number]

## 2011-01-23 NOTE — H&P (Signed)
NAME:  Cheryl [name], Cheryl [name]                   ACCOUNT NO.:  [id number]   MEDICAL RECORD NO.:  [id number]          PATIENT TYPE:  IPS   LOCATION:  0405                          FACILITY:  [facility]   PHYSICIAN:  Geoffery Lyons, M.D.      DATE OF BIRTH:  08-08-1956   DATE OF ADMISSION:  12/10/2007  DATE OF DISCHARGE:                       PSYCHIATRIC ADMISSION ASSESSMENT   IDENTIFICATION:  A 55 year old white female who is single.  This is a  voluntary admission.   HISTORY OF PRESENT ILLNESS:  This is the third or fourth [facility] admission  for this 55 year old with a history of psychosis referred by [facility] where she presented yesterday for her routine  appointment.  She was disheveled, internally distracted, was seeing  people in the room.  Also reported she was hearing voices that were  telling her that she was going to die.  Her therapist was concerned that  the boyfriend that she lives with travels and she is not reliable about  taking her medications when he is out of the home.  There were also some  concerns about her basic safety with possibly leaving the stove on or  doing something dangerous without closer supervision.  Today she was  cooperative and directable but oriented to person only, confused about  day and date.  She does endorse hearing some voices today.  Denies that  she has any desire to hurt herself.   PAST PSYCHIATRIC HISTORY:  The patient is currently followed at [facility] and by [facility] of the [place].  Her first  admission to [facility] was in 1999.  Subsequent admission June 15 to the 23rd  2008 and most recently February 3 to the 10th, 2009.  She has been  diagnosed with schizophrenia in the past and also with major depressive  disorder with psychosis.   SOCIAL HISTORY:  A single white female currently living with her  boyfriend, Adele Schilder and contact information is in the record and  from previous records indicate that she does  have a daughter who also  lives in the area.  She has Medicaid resources to pay for care and is  unemployed.   FAMILY HISTORY:  Not available.   ALCOHOL AND DRUG HISTORY:  No history of substance abuse.   MEDICAL HISTORY:  The patient is followed by [facility] where  she sees Duke Salvia, NP, her primary care practitioner.   CURRENT MEDICAL PROBLEMS:  1. Hypertension.  2. Hypothyroidism.  3. Migraine headaches.  4. Has a history of some chronic back pain from herniated lumbar disk.   CURRENT MEDICATIONS:  We did contact [facility] to validate at least  some of her medications and mental health for the others.  Currently  patient is on:  1. Klonopin 0.5 mg p.o. b.i.d.  2. Trazodone 100 mg, 2-3 tablets nightly  3. Maxalt 10 mg p.o. p.r.n. at onset of headache.  4. Levothyroxine 150 mcg daily.  5. Neurontin 300 mg p.o. t.i.d.  6. Percocet 5/325 q.i.d. p.r.n. for back pain.  7. HCTZ 12.5  mg daily.  8. Allegra 180 mg daily p.r.n. for allergies.  9. B12 injection twice a month and her current schedule is not known.  10.Abilify 10 mg daily.  11.Furosemide 20 mg daily.  12.Lithium carbonate 300 mg p.o. t.i.d.  13.Invega 9 mg q.a.m.      Margaret A. Scott, N.P.      Geoffery Lyons, M.D.  Electronically Signed    MAS/MEDQ  D:  12/11/2007  T:  12/11/2007  Job:  [id number]

## 2011-01-26 NOTE — Discharge Summary (Signed)
NAME:  Cheryl [name], Cheryl [name]                   ACCOUNT NO.:  [id number]   MEDICAL RECORD NO.:  [id number]          PATIENT TYPE:  IPS   LOCATION:  0306                          FACILITY:  [facility]   PHYSICIAN:  Anselm Jungling, MD  DATE OF BIRTH:  19-Jan-1956   DATE OF ADMISSION:  10/14/2007  DATE OF DISCHARGE:  10/21/2007                               DISCHARGE SUMMARY   IDENTIFYING DATA AND REASON FOR ADMISSION:  This was an inpatient  psychiatric admission for Cheryl [name], a 55 year old female admitted due to  worsening of symptoms of chronic psychotic illness.  She is a patient of  [facility], where she sees Dr. Allyne Gee.  She came to  [place] on numerous medications, both medical and psychiatric.  She stated  that she had been taking all of her medications correctly.  Please refer  to the admission note for further details pertaining to the symptoms,  circumstances and history that led to her hospitalization.  She was  given an initial Axis I diagnosis of schizophrenia, acute paranoid  episode, chronic disorder.   MEDICAL AND LABORATORY:  The patient was medically and physically  assessed by the psychiatric nurse practitioner.  She came to [place] with a  history of hypertension and hypothyroidism.  She was continued on her  usual hydrochlorothiazide and Synthroid.  There were no acute medical  issues.   HOSPITAL COURSE:  The patient was admitted to the adult inpatient  psychiatric service.  She presented as a well-nourished, well-developed  woman who was alert, fully oriented, pleasant, calm and cooperative.  She was a fairly good historian.  She admitted to increased auditory  hallucinations recently.  She made no overtly delusional statements.  She verbalized a strong desire for help.   She had come to [place] on numerous psychotropic medications, and least three  occasions the patient indicated to me that she was hoping that some of  her medications could be eliminated.  She was treated  with a regimen of  Abilify, Klonopin, trazodone, Effexor and Neurontin.  These were  adjusted __________ symptoms.  She was involved in the therapeutic  milieu and was a good participant in the treatment.  She was pleasant  and cooperative throughout her inpatient stay.   There were plans for a family meeting to occur on the seventh hospital  day the but the patient's adult daughter called and cancelled at the  last moment.  The psychiatric case manager spoke with the patient's  boyfriend, who indicated that he would be able to pick her up the  following day.  As such, she was discharged on the eighth hospital day.  At that time she was in much improved symptomatically and subjectively  felt much better as well.  She was bright, oriented, alert, and  nondelusional.  She agreed to the following aftercare plan.   AFTERCARE:  The patient was to follow up with the [facility] with  an appointment on October 23, 2007, and [facility] of [place],  with an appointment on October 24, 2007.   DISCHARGE MEDICATIONS:  1.  Klonopin 0.5 mg b.i.d.  2. Trazodone 100 mg nightly.  3. Neurontin 300 mg t.i.d.  4. Hydrochlorothiazide 12.5 mg daily.  5. Effexor XR 300 mg daily.  6. Synthroid 100 mcg daily.  7. Abilify 20 mg nightly.   DISCHARGE DIAGNOSES:  AXIS I: Schizophrenia, chronic paranoid, acute  exacerbation.  AXIS II:  Deferred.  AXIS III:  History of hypertension, hypothyroidism.  AXIS IV:  Stressors severe.  AXIS V:  GAF on discharge 60.      Anselm Jungling, MD  Electronically Signed     SPB/MEDQ  D:  10/27/2007  T:  10/28/2007  Job:  [id number]

## 2011-01-26 NOTE — Discharge Summary (Signed)
NAME:  Cheryl [name], Cheryl [name]                   ACCOUNT NO.:  [id number]   MEDICAL RECORD NO.:  [id number]          PATIENT TYPE:  IPS   LOCATION:  0407                          FACILITY:  [facility]   PHYSICIAN:  Geoffery Lyons, M.D.      DATE OF BIRTH:  April 06, 1956   DATE OF ADMISSION:  12/10/2007  DATE OF DISCHARGE:  12/19/2007                               DISCHARGE SUMMARY   CHIEF COMPLAINT/HISTORY OF PRESENT ILLNESS:  This was the third or  fourth admission to [facility] for this 55 year old  white female voluntarily admitted.  She presented for a routine  appointment to mental health, reported that her thinking was confused,  waking up a lot at night, unable to sleep.  She was hearing voices  telling her that she just needed to go ahead and hurt herself, that she  was going to die.  Confused about person, place and situation.  Referred  by her therapist for inpatient treatment.  Concerned for her safety.  Living with her boyfriend, who expressed time to time concerns about her  not taking medications regularly.   PAST PSYCHIATRIC HISTORY:  Followed by Dr. Tiburcio Pea at [facility] and [facility] of the [place].  Fourth time  at [facility],  first time in 1999.  Diagnosed previously with  schizophrenia, major depression with psychosis.   ALCOHOL AND DRUG HISTORY:  Denies active use of any substances.   MEDICAL HISTORY:  Hypothyroidism, hypertension, migraines, back pain.   MEDICATIONS:  1. Klonopin 0.5 twice a day.  2. Trazodone 100 two to three at night.  3. Maxalt 10 mg as needed for migraine.  4. Levothyroxine 150 mcg per day.  5. Neurontin 300 mg 3 times a day.  6. Percocet 5/325 one 4 times a day as needed for pain.  7. Hydrochlorothiazide 12.5 mg per day.  8. Allegra 180 mg per day.  9. B12 injection.  10.Abilify 10 mg per day.  11.Lithium 300 mg 3 times a day.  12.Invega 9 mg per day.   PHYSICAL EXAMINATION:  Failed to show any  acute findings.   LABORATORY WORK:  CBC:  White blood cells 7.9, hemoglobin 12.3,  platelets 365,000.  Sodium 134, potassium 3.3, BUN 13, creatinine 1.06,  glucose 121, SGOT 18, SGPT 16, total bilirubin 0.6, TSH 31.7.  Lithium  0.87.   MENTAL STATUS EXAM:  Reveals an alert, cooperative female who was  disheveled.  Affect was pleasant.  Greets with a smile but very  superficial.  Speech was decreased in production.  Does appear to be  particularly stimulated.  Mood is anxious.  Affect sometimes perplexed.  When asked several pieces of information she could not answer, like her  boyfriend's name, needs direction and prompting.  Endorsed having  auditory hallucinations telling her that she was going to die although  denies any intent to harm herself.  Memory seemed fair.  Orientation was  impaired.   ADMITTING DIAGNOSES:  AXIS I:  Psychotic disorder not otherwise  specified.  AXIS II:  No diagnosis.  AXIS  III:  Migraine headaches, back pain, hypothyroidism.  AXIS IV:  Moderate.  AXIS V:  On admission 35, highest GAF in the last year 60.   COURSE IN THE HOSPITAL:  She was admitted, started in individual and  group psychotherapy.  She was initially placed on trazodone.  Her other  medications were reassessed.  She apparently had exacerbation of her  hallucinations and delusional ideas, also seeing images.  She mentioned  an imaginary friend.  She was in [facility] February 2009 and July  2008.  She has also been in [facility].  Her TSH was high.  Lithium was 0.87.  She was confused, pleasantly confused, disoriented,  having a rough time.  Nonspecific upset but not sure why.  Endorsed that  she had been seeing things, hearing things.  Did worry about the girl.  Thyroid pretty much out of control, so we went ahead and discontinued  the lithium given also the fact of the possible side effects from this  medication.  She continued having some disorientation, hallucinations.   There was a family session May 5, but she was lethargic.  Boyfriend and  sister were supportive.  Family felt she was overmedicated.  The last  time they saw [name] was 4 weeks prior to this admission.  Usually perky.  __________ April 6 still not completely clear.  Hallucinations referring to a little girl.  Difficulty with ambulation.  Also endorsed lightheadedness.  The lithium was discontinued.  We  continued to optimize treatment with medications.  We decreased the  Invega as it seemed that she did better in the past on Abilify.  As we  decreased the dose of medication, she continued to improve, decreasing  the confusion.  Still some hallucinatory phenomenon but overall improved  reality testing.  On April 10 she was better.  There were no active  suicidal or homicidal ideas, no hallucinations or delusions.  No  confusion.  The hallucinations were better.  No spontaneous psychotic  content.   DISCHARGE DIAGNOSES:  AXIS I:  Psychotic disorder NOS.  AXIS II:  No diagnosis.  AXIS III:  Migraines, back pain, hypothyroidism.  AXIS IV:  Moderate.  AXIS V:  On discharge 50 to 55.   Discharged on:  1. Levothyroxine 125 mcg per day.  2. Vitamin B12 twice daily.  3. Neurontin 300 twice a day.  4. Klonopin 0.5 twice a day.  5. Abilify 10 mg per day.  6. Maxalt for headache.  7. Ibuprofen for pain.   Requested not to take any more lithium, Percocet, Invega,  hydrochlorothiazide or trazodone.   Follow up at with Dr. Lang Snow at [facility].      Geoffery Lyons, M.D.  Electronically Signed     IL/MEDQ  D:  01/21/2008  T:  01/21/2008  Job:  [id number]

## 2011-03-05 ENCOUNTER — Emergency Department (HOSPITAL_COMMUNITY)
Admission: EM | Admit: 2011-03-05 | Discharge: 2011-03-06 | Disposition: A | Discharge status: Other Acute Inpatient Hospital | Payer: 59 | Source: Home / Self Care | Attending: Emergency Medicine | Admitting: Emergency Medicine

## 2011-03-05 DIAGNOSIS — R45851 Suicidal ideations: Secondary | ICD-10-CM | POA: Insufficient documentation

## 2011-03-05 DIAGNOSIS — F3289 Other specified depressive episodes: Secondary | ICD-10-CM | POA: Insufficient documentation

## 2011-03-05 DIAGNOSIS — F329 Major depressive disorder, single episode, unspecified: Secondary | ICD-10-CM | POA: Insufficient documentation

## 2011-03-05 DIAGNOSIS — R443 Hallucinations, unspecified: Secondary | ICD-10-CM | POA: Insufficient documentation

## 2011-03-05 LAB — COMPREHENSIVE METABOLIC PANEL
ALT: 40 U/L — ABNORMAL HIGH (ref 0–35)
CO2: 25 mEq/L (ref 19–32)
Calcium: 9 mg/dL (ref 8.4–10.5)
Chloride: 101 mEq/L (ref 96–112)
GFR calc Af Amer: >60 mL/min (ref >60)
GFR calc non Af Amer: >60 mL/min (ref >60)
Glucose, Bld: 186 mg/dL — ABNORMAL HIGH (ref 70–99)
Sodium: 137 mEq/L (ref 135–145)
Total Bilirubin: 0.1 mg/dL — ABNORMAL LOW (ref 0.3–1.2)

## 2011-03-05 LAB — CBC
Hemoglobin: 11.3 g/dL — ABNORMAL LOW (ref 12.0–15.0)
MCH: 27.4 pg (ref 26.0–34.0)
MCV: 85.4 fL (ref 78.0–100.0)
RBC: 4.12 MIL/uL (ref 3.87–5.11)
WBC: 7.6 10*3/uL (ref 4.0–10.5)

## 2011-03-05 LAB — DIFFERENTIAL
Basophils Relative: 0 % (ref 0–1)
Lymphs Abs: 1.9 10*3/uL (ref 0.7–4.0)
Monocytes Relative: 7 % (ref 3–12)
Neutro Abs: 4.8 10*3/uL (ref 1.7–7.7)
Neutrophils Relative %: 63 % (ref 43–77)

## 2011-03-05 LAB — URINALYSIS, ROUTINE W REFLEX MICROSCOPIC
Ketones, ur: NEGATIVE mg/dL
Leukocytes, UA: NEGATIVE
Nitrite: NEGATIVE
Protein, ur: NEGATIVE mg/dL

## 2011-03-06 ENCOUNTER — Inpatient Hospital Stay (HOSPITAL_COMMUNITY)
Admission: AD | Admit: 2011-03-06 | Discharge: 2011-03-20 | DRG: 885 | Disposition: A | Discharge status: Home / Self Care | Payer: 59 | Source: Ambulatory Visit | Attending: Psychiatry | Admitting: Psychiatry

## 2011-03-06 DIAGNOSIS — F259 Schizoaffective disorder, unspecified: Principal | ICD-10-CM

## 2011-03-06 DIAGNOSIS — Z560 Unemployment, unspecified: Secondary | ICD-10-CM

## 2011-03-06 DIAGNOSIS — E039 Hypothyroidism, unspecified: Secondary | ICD-10-CM

## 2011-03-06 DIAGNOSIS — G589 Mononeuropathy, unspecified: Secondary | ICD-10-CM

## 2011-03-06 DIAGNOSIS — G8929 Other chronic pain: Secondary | ICD-10-CM

## 2011-03-06 DIAGNOSIS — M549 Dorsalgia, unspecified: Secondary | ICD-10-CM

## 2011-03-06 DIAGNOSIS — R45851 Suicidal ideations: Secondary | ICD-10-CM

## 2011-03-07 DIAGNOSIS — F259 Schizoaffective disorder, unspecified: Secondary | ICD-10-CM

## 2011-03-08 NOTE — H&P (Signed)
Cheryl [name], Cheryl [name] NO.:  [id number]  MEDICAL RECORD NO.:  [id number]  LOCATION:  0254                          FACILITY:  [facility]  PHYSICIAN:  Debbora Lacrosse, MD       DATE OF BIRTH:  10-22-1955  DATE OF ADMISSION:  03/06/2011 DATE OF DISCHARGE:                      PSYCHIATRIC ADMISSION ASSESSMENT   Age 55, Caucasian female.  REASON FOR ADMISSION:  Patient is psychotic and depressed.  CHIEF COMPLAINT:  "I started hearing voices and thinking about killing myself."  HISTORY OF PRESENT ILLNESS:  This is 1 of many admissions to this facility for this 55 year old, Caucasian female, who is followed by Dr. Senaida Ores on the ACT team at [facility].  She reports compliance with her medications.  However, over the last 2 weeks, she started hearing more voices constantly.  For the last 4 days, has had very strong hallucinations telling her to kill herself and that it was not worth living any longer.  She has been feeling more depressed and distressed about family issues, stating that her brother-in-law has cancer, her sister has cancer, and a sister died recently from lupus. She also went into some discussion about her brother comitting suicide a year or so ago after his daughter had been murdered.  The patient appears to be depressed and states that she does not want to end her life, and that since she has been in this facility, she is having some relief from the auditory hallucinations and actually slept fairly well last night.  PAST PSYCHIATRIC HISTORY:  The patient has a long-term diagnosis of schizoaffective disorder and is followed by the ACT team.  She has had hospitalizations here, most recently in October 2011.  FAMILY PSYCHIATRIC HISTORY:  She reports her brother committed suicide, but states that was related to his daughter being murdered.  SOCIAL HISTORY:  Lives with her daughter and her boyfriend, who she reports are both supportive.  She also has  support from her ACT team. She is unemployed.  REVIEW OF SYSTEMS:  The patient denies any current medical symptoms.  MEDICAL HISTORY: 1. Hypothyroidism. 2. Chronic back pain. 3. Neuropathy. 4. Seasonal allergies.  ALLERGIES:  SULFA.  CURRENT MEDICATIONS: 1. Xanax 0.5 mg t.i.d. 2. Celexa 40 mg daily. 3. Colace 100 mg b.i.d. 4. Gabapentin 600 mg q.i.d. 5. Synthroid 150 mcg daily. 6. Claritin 10 mg daily. 7. Zyprexa 15 mg q.h.s. 8. Trazodone 100 mg q.h.s.  PSYCHIATRIC EVALUATION:  Vital signs normal, stable.  She is cooperative, casually dressed.  Good eye contact.  Appropriate interaction.  Gait is normal.  Strength is normal.  No abnormal motor movements.  Attention and concentration are adequate.  Memory is intact. Speech is normal in rate, tone, and volume.  Her thoughts are fairly logical and goal-directed.  Associations are intact.  Mood is depressed- appearing.  She has some suicidal thinking, but denies any intent at this time.  Affect is flat.  Thought Content:  Some hallucinations, auditory in command initially, but denies that today.  Insight is fair. Judgement is fair.  DIAGNOSTIC IMPRESSION:  Axis I.  Schizoaffective disorder. Axis II.  Deferred. Axis III.  Back pain, hypothyroidism, neuropathy. Axis IV.  Problems with support, social environment, chronic illness. Axis V. 30.  INITIAL TREATMENT PLAN:  Patient will have an increase in Zyprexa to 20 mg q.h.s.  Patient had been on Wellbutrin, but this has been discontinued.  Will have further evaluation by the treatment team.  Will have collateral information from her family.          ______________________________ Debbora Lacrosse, MD     WS/MEDQ  D:  03/07/2011  T:  03/07/2011  Job:  [id number]  Electronically Signed by Andi Devon Chandler Stofer  on 03/08/2011 08:34:48 AM

## 2011-03-11 DIAGNOSIS — F329 Major depressive disorder, single episode, unspecified: Secondary | ICD-10-CM

## 2011-03-11 DIAGNOSIS — F3289 Other specified depressive episodes: Secondary | ICD-10-CM

## 2011-03-11 DIAGNOSIS — F259 Schizoaffective disorder, unspecified: Secondary | ICD-10-CM

## 2011-03-13 LAB — COMPREHENSIVE METABOLIC PANEL
AST: 38 U/L — ABNORMAL HIGH (ref 0–37)
Albumin: 3.4 g/dL — ABNORMAL LOW (ref 3.5–5.2)
BUN: 13 mg/dL (ref 6–23)
Calcium: 9 mg/dL (ref 8.4–10.5)
Creatinine, Ser: 0.74 mg/dL (ref 0.50–1.10)

## 2011-03-13 LAB — CBC
HCT: 36.6 % (ref 36.0–46.0)
MCH: 27.9 pg (ref 26.0–34.0)
MCHC: 32.5 g/dL (ref 30.0–36.0)
MCV: 85.7 fL (ref 78.0–100.0)
Platelets: 261 10*3/uL (ref 150–400)
RDW: 13.3 % (ref 11.5–15.5)

## 2011-03-13 LAB — DIFFERENTIAL
Eosinophils Absolute: 0.4 10*3/uL (ref 0.0–0.7)
Eosinophils Relative: 7 % — ABNORMAL HIGH (ref 0–5)
Lymphs Abs: 1.7 10*3/uL (ref 0.7–4.0)
Monocytes Absolute: 0.5 10*3/uL (ref 0.1–1.0)
Monocytes Relative: 10 % (ref 3–12)

## 2011-03-13 LAB — GLUCOSE, CAPILLARY: Glucose-Capillary: 311 mg/dL — ABNORMAL HIGH (ref 70–99)

## 2011-03-14 LAB — GLUCOSE, CAPILLARY
Glucose-Capillary: 212 mg/dL — ABNORMAL HIGH (ref 70–99)
Glucose-Capillary: 225 mg/dL — ABNORMAL HIGH (ref 70–99)
Glucose-Capillary: 250 mg/dL — ABNORMAL HIGH (ref 70–99)
Glucose-Capillary: 233 mg/dL — ABNORMAL HIGH (ref 70–99)

## 2011-03-14 LAB — HEMOGLOBIN A1C
Hgb A1c MFr Bld: 7.6 % — ABNORMAL HIGH (ref <5.7)
Mean Plasma Glucose: 171 mg/dL — ABNORMAL HIGH (ref <117)

## 2011-03-15 LAB — GLUCOSE, CAPILLARY
Glucose-Capillary: 267 mg/dL — ABNORMAL HIGH (ref 70–99)
Glucose-Capillary: 154 mg/dL — ABNORMAL HIGH (ref 70–99)

## 2011-03-15 LAB — HEMOGLOBIN A1C
Hgb A1c MFr Bld: 7.6 % — ABNORMAL HIGH (ref <5.7)
Mean Plasma Glucose: 171 mg/dL — ABNORMAL HIGH (ref <117)

## 2011-03-16 LAB — GLUCOSE, CAPILLARY
Glucose-Capillary: 178 mg/dL — ABNORMAL HIGH (ref 70–99)
Glucose-Capillary: 263 mg/dL — ABNORMAL HIGH (ref 70–99)

## 2011-03-17 LAB — GLUCOSE, CAPILLARY
Glucose-Capillary: 232 mg/dL — ABNORMAL HIGH (ref 70–99)
Glucose-Capillary: 283 mg/dL — ABNORMAL HIGH (ref 70–99)
Glucose-Capillary: 230 mg/dL — ABNORMAL HIGH (ref 70–99)

## 2011-03-18 LAB — GLUCOSE, CAPILLARY

## 2011-03-19 LAB — GLUCOSE, CAPILLARY
Glucose-Capillary: 187 mg/dL — ABNORMAL HIGH (ref 70–99)
Glucose-Capillary: 175 mg/dL — ABNORMAL HIGH (ref 70–99)

## 2011-03-28 NOTE — Discharge Summary (Signed)
Cheryl [name], Cheryl [name]                   ACCOUNT NO.:  [id number]  MEDICAL RECORD NO.:  [id number]  LOCATION:  0507                          FACILITY:  [facility]  PHYSICIAN:  Franchot Gallo, MD     DATE OF BIRTH:  Aug 10, 1956  DATE OF ADMISSION:  03/06/2011 DATE OF DISCHARGE:  03/20/2011                              DISCHARGE SUMMARY   REASON FOR ADMISSION:  This was a 55 year old female who was admitted for hearing voices and having suicidal thoughts.  The voices were more intense.  She was having hallucinations telling her to kill herself, stating that it was not worth living any longer.  FINAL IMPRESSION:  Axis  I:  Schizoaffective disorder. Axis II:  Deferred. Axis III:  Back pain, hypothyroidism, neuropathy. Axis IV:  Problems with support, social environment and chronic illness. Axis V:  50-55.  PERTINENT LABS:  Urine negative.  Urine drug screen negative. Hemoglobin 11.3, hematocrit 35.2.  Alcohol level less than 11.  BMET is within normal limits.  SIGNIFICANT FINDINGS:  The patient was admitted to the adult milieu, and we increased her Zyprexa to 20 mg.  We discontinued her Wellbutrin.  The patient was reporting feeling better but not quite right, having a poor mood.  Denied any acute dangerous ideas.  We had contact with the patient's daughter to provide information and to assess any safety concerns.  We initiated Navane 2 mg at bedtime for hallucinations and had Klonopin available for her anxiety and Robaxin for complaints of back pain.  Her sleep was improving.  Appetite was improving.  Having mild hallucinations but could not make them out.  We discontinued her Klonopin, as the patient was having significant anxiety, and resumed her Xanax.  The patient was active in attending groups.  Her sleep was improving.  Appetite was good.  Her hallucinations were almost resolved. She continued to endorse minimal auditory hallucinations of voices that she thought she could not  understand.  We had the diabetic nurse educator talk to the patient about her blood sugars that were elevated and further followup.  She was beginning to feel much better than she had in awhile.  On day of discharge, the patient had some problems sleeping but her appetite was good.  Having mild depressive symptoms rating it a 3 on a scale of 1-10.  Adamantly denied any suicidal or homicidal thoughts or auditory or visual hallucinations.  Having mild anxiety.  DISCHARGE MEDICATIONS: 1. Xanax 0.5 mg b.i.d. and h.s. 2. Colace 100 mg b.i.d. 3. Lantus insulin 10 units q.h.s. 4. Metformin 500 mg b.i.d. 5. Robaxin 500 mg b.i.d. and h.s. 6. Zyprexa 10 mg taking 2 q.h.s. 7. Navane 2 mg q.h.s. 8. Celexa 40 mg daily. 9. Claritin 10 mg daily. 10.Neurontin 800 mg q.i.d. 11.Synthroid 150 mcg daily. 12.Trazodone 100 mg q.h.s.  The patient was to stop taking her     Wellbutrin and the 15 mg of Zyprexa.  FOLLOWUP APPOINTMENT:  The ACT team, [facility], phone number [phone].  The patient was to follow up with [facility] clinic in about 2 weeks at [phone].     Landry Corporal, N.P.   ______________________________ Franchot Gallo, MD  JO/MEDQ  D:  03/27/2011  T:  03/27/2011  Job:  [id number]  Electronically Signed by Limmie Patricia.P. on 03/28/2011 09:46:00 AM Electronically Signed by Franchot Gallo MD on 03/28/2011 12:00:42 PM

## 2011-05-31 LAB — COMPREHENSIVE METABOLIC PANEL
ALT: 32
AST: 33
Albumin: 3.4 — ABNORMAL LOW
Alkaline Phosphatase: 52
BUN: 6
Chloride: 100
Potassium: 3.4 — ABNORMAL LOW
Sodium: 134 — ABNORMAL LOW
Total Bilirubin: 0.6

## 2011-05-31 LAB — DIFFERENTIAL
Basophils Absolute: 0
Basophils Relative: 0
Eosinophils Absolute: 0.2
Eosinophils Relative: 3
Monocytes Absolute: 0.6
Monocytes Relative: 8
Neutro Abs: 5

## 2011-05-31 LAB — CBC
HCT: 33.3 — ABNORMAL LOW
Platelets: 391
WBC: 7.6

## 2011-06-01 LAB — CBC
HCT: 37.3
Hemoglobin: 12.9
MCHC: 34.7
RBC: 4.37
RDW: 13

## 2011-06-01 LAB — DRUGS OF ABUSE SCREEN W/O ALC, ROUTINE URINE
Benzodiazepines.: NEGATIVE
Cocaine Metabolites: NEGATIVE
Methadone: NEGATIVE
Opiate Screen, Urine: NEGATIVE
Phencyclidine (PCP): NEGATIVE
Propoxyphene: NEGATIVE

## 2011-06-01 LAB — URINALYSIS, ROUTINE W REFLEX MICROSCOPIC
Bilirubin Urine: NEGATIVE
Hgb urine dipstick: NEGATIVE
Ketones, ur: NEGATIVE
Nitrite: NEGATIVE
Urobilinogen, UA: 0.2

## 2011-06-01 LAB — TSH: TSH: 0.528

## 2011-06-01 LAB — COMPREHENSIVE METABOLIC PANEL
ALT: 17
Alkaline Phosphatase: 59
BUN: 9
CO2: 29
GFR calc non Af Amer: >60
Glucose, Bld: 115 — ABNORMAL HIGH
Potassium: 4
Sodium: 140
Total Protein: 6.7

## 2011-06-01 LAB — LITHIUM LEVEL: Lithium Lvl: 0.49 — ABNORMAL LOW

## 2011-06-01 LAB — URINE MICROSCOPIC-ADD ON

## 2011-06-05 LAB — CBC
Hemoglobin: 12.9
MCHC: 35
MCV: 87.1
Platelets: 365
Platelets: 456 — ABNORMAL HIGH
RBC: 4.04
RDW: 13.2
RDW: 13.3
WBC: 9.6

## 2011-06-05 LAB — COMPREHENSIVE METABOLIC PANEL
ALT: 16
ALT: 20
AST: 18
Albumin: 3.9
Albumin: 4.2
Alkaline Phosphatase: 75
Calcium: 9.5
Chloride: 97
Creatinine, Ser: 1.06
GFR calc Af Amer: >60
GFR calc non Af Amer: 54 — ABNORMAL LOW
Glucose, Bld: 128 — ABNORMAL HIGH
Potassium: 3.7
Sodium: 134 — ABNORMAL LOW
Sodium: 136
Total Protein: 6.8
Total Protein: 7.1

## 2011-06-05 LAB — MAGNESIUM: Magnesium: 2.1

## 2011-06-05 LAB — DIFFERENTIAL
Eosinophils Absolute: 0.2
Eosinophils Relative: 3
Lymphocytes Relative: 22
Lymphs Abs: 1.7
Monocytes Absolute: 0.4
Monocytes Relative: 6

## 2011-06-05 LAB — BASIC METABOLIC PANEL
Chloride: 101
GFR calc non Af Amer: 54 — ABNORMAL LOW
Glucose, Bld: 117 — ABNORMAL HIGH
Potassium: 3.3 — ABNORMAL LOW
Sodium: 138

## 2011-06-05 LAB — TSH
TSH: 16.69 — ABNORMAL HIGH
TSH: 31.7 — ABNORMAL HIGH

## 2011-06-12 ENCOUNTER — Emergency Department (HOSPITAL_COMMUNITY)
Admission: EM | Admit: 2011-06-12 | Discharge: 2011-06-13 | Disposition: A | Discharge status: Other Acute Inpatient Hospital | Payer: 59 | Source: Home / Self Care | Attending: Emergency Medicine | Admitting: Emergency Medicine

## 2011-06-12 DIAGNOSIS — Z794 Long term (current) use of insulin: Secondary | ICD-10-CM | POA: Insufficient documentation

## 2011-06-12 DIAGNOSIS — R45851 Suicidal ideations: Secondary | ICD-10-CM | POA: Insufficient documentation

## 2011-06-12 DIAGNOSIS — E119 Type 2 diabetes mellitus without complications: Secondary | ICD-10-CM | POA: Insufficient documentation

## 2011-06-12 DIAGNOSIS — F259 Schizoaffective disorder, unspecified: Secondary | ICD-10-CM | POA: Insufficient documentation

## 2011-06-12 DIAGNOSIS — Z79899 Other long term (current) drug therapy: Secondary | ICD-10-CM | POA: Insufficient documentation

## 2011-06-12 DIAGNOSIS — E039 Hypothyroidism, unspecified: Secondary | ICD-10-CM | POA: Insufficient documentation

## 2011-06-12 LAB — COMPREHENSIVE METABOLIC PANEL
ALT: 45 U/L — ABNORMAL HIGH (ref 0–35)
BUN: 10 mg/dL (ref 6–23)
CO2: 24 mEq/L (ref 19–32)
Calcium: 9 mg/dL (ref 8.4–10.5)
Creatinine, Ser: 0.7 mg/dL (ref 0.50–1.10)
GFR calc Af Amer: >90 mL/min (ref >90)
GFR calc non Af Amer: >90 mL/min (ref >90)
Glucose, Bld: 183 mg/dL — ABNORMAL HIGH (ref 70–99)

## 2011-06-12 LAB — CBC
HCT: 37.2 % (ref 36.0–46.0)
Hemoglobin: 11.9 g/dL — ABNORMAL LOW (ref 12.0–15.0)
MCH: 27.1 pg (ref 26.0–34.0)
MCHC: 32 g/dL (ref 30.0–36.0)
MCV: 84.7 fL (ref 78.0–100.0)

## 2011-06-12 LAB — TRICYCLICS SCREEN, URINE: TCA Scrn: NOT DETECTED

## 2011-06-12 LAB — DIFFERENTIAL
Eosinophils Relative: 5 % (ref 0–5)
Lymphocytes Relative: 35 % (ref 12–46)
Monocytes Absolute: 0.6 10*3/uL (ref 0.1–1.0)
Monocytes Relative: 9 % (ref 3–12)
Neutro Abs: 3.7 10*3/uL (ref 1.7–7.7)

## 2011-06-12 LAB — RAPID URINE DRUG SCREEN, HOSP PERFORMED
Amphetamines: NOT DETECTED
Tetrahydrocannabinol: NOT DETECTED

## 2011-06-12 LAB — URINALYSIS, ROUTINE W REFLEX MICROSCOPIC
Glucose, UA: NEGATIVE mg/dL
Ketones, ur: NEGATIVE mg/dL
Leukocytes, UA: NEGATIVE
Protein, ur: NEGATIVE mg/dL

## 2011-06-12 LAB — SALICYLATE LEVEL: Salicylate Lvl: <2 mg/dL — ABNORMAL LOW (ref 2.8–20.0)

## 2011-06-12 LAB — ETHANOL: Alcohol, Ethyl (B): <11 mg/dL (ref 0–11)

## 2011-06-12 LAB — GLUCOSE, CAPILLARY: Glucose-Capillary: 137 mg/dL — ABNORMAL HIGH (ref 70–99)

## 2011-06-13 ENCOUNTER — Inpatient Hospital Stay (HOSPITAL_COMMUNITY)
Admission: AD | Admit: 2011-06-13 | Discharge: 2011-06-18 | DRG: 885 | Disposition: A | Discharge status: Home / Self Care | Payer: 59 | Source: Ambulatory Visit | Attending: Psychiatry | Admitting: Psychiatry

## 2011-06-13 DIAGNOSIS — Z79899 Other long term (current) drug therapy: Secondary | ICD-10-CM

## 2011-06-13 DIAGNOSIS — F259 Schizoaffective disorder, unspecified: Principal | ICD-10-CM

## 2011-06-13 DIAGNOSIS — G609 Hereditary and idiopathic neuropathy, unspecified: Secondary | ICD-10-CM

## 2011-06-13 DIAGNOSIS — E119 Type 2 diabetes mellitus without complications: Secondary | ICD-10-CM

## 2011-06-13 DIAGNOSIS — F3289 Other specified depressive episodes: Secondary | ICD-10-CM

## 2011-06-13 DIAGNOSIS — Z794 Long term (current) use of insulin: Secondary | ICD-10-CM

## 2011-06-13 DIAGNOSIS — M549 Dorsalgia, unspecified: Secondary | ICD-10-CM

## 2011-06-13 DIAGNOSIS — F329 Major depressive disorder, single episode, unspecified: Secondary | ICD-10-CM

## 2011-06-13 DIAGNOSIS — G8929 Other chronic pain: Secondary | ICD-10-CM

## 2011-06-13 DIAGNOSIS — Z733 Stress, not elsewhere classified: Secondary | ICD-10-CM

## 2011-06-13 DIAGNOSIS — Z882 Allergy status to sulfonamides status: Secondary | ICD-10-CM

## 2011-06-13 DIAGNOSIS — E039 Hypothyroidism, unspecified: Secondary | ICD-10-CM

## 2011-06-13 LAB — LIPID PANEL
HDL: 33 mg/dL — ABNORMAL LOW (ref >39)
LDL Cholesterol: 96 mg/dL (ref 0–99)
Triglycerides: 238 mg/dL — ABNORMAL HIGH (ref <150)
VLDL: 48 mg/dL — ABNORMAL HIGH (ref 0–40)

## 2011-06-13 LAB — GLUCOSE, CAPILLARY: Glucose-Capillary: 87 mg/dL (ref 70–99)

## 2011-06-14 DIAGNOSIS — F259 Schizoaffective disorder, unspecified: Secondary | ICD-10-CM

## 2011-06-14 LAB — HEMOGLOBIN A1C
Hgb A1c MFr Bld: 6.8 % — ABNORMAL HIGH (ref <5.7)
Mean Plasma Glucose: 148 mg/dL — ABNORMAL HIGH (ref <117)

## 2011-06-14 LAB — T3, FREE: T3, Free: 4.5 pg/mL — ABNORMAL HIGH (ref 2.3–4.2)

## 2011-06-14 LAB — T4, FREE: Free T4: 2.49 ng/dL — ABNORMAL HIGH (ref 0.80–1.80)

## 2011-06-14 LAB — TSH: TSH: 0.017 u[IU]/mL — ABNORMAL LOW (ref 0.350–4.500)

## 2011-06-15 LAB — GLUCOSE, CAPILLARY
Glucose-Capillary: 123 mg/dL — ABNORMAL HIGH (ref 70–99)
Glucose-Capillary: 153 mg/dL — ABNORMAL HIGH (ref 70–99)

## 2011-06-16 LAB — GLUCOSE, CAPILLARY
Glucose-Capillary: 214 mg/dL — ABNORMAL HIGH (ref 70–99)
Glucose-Capillary: 160 mg/dL — ABNORMAL HIGH (ref 70–99)

## 2011-06-17 LAB — GLUCOSE, CAPILLARY
Glucose-Capillary: 169 mg/dL — ABNORMAL HIGH (ref 70–99)
Glucose-Capillary: 115 mg/dL — ABNORMAL HIGH (ref 70–99)

## 2011-06-18 LAB — GLUCOSE, CAPILLARY
Glucose-Capillary: 139 mg/dL — ABNORMAL HIGH (ref 70–99)
Glucose-Capillary: 139 mg/dL — ABNORMAL HIGH (ref 70–99)

## 2011-06-21 LAB — COMPREHENSIVE METABOLIC PANEL
ALT: 20
Alkaline Phosphatase: 62
CO2: 27
Chloride: 100
Glucose, Bld: 118 — ABNORMAL HIGH
Potassium: 4
Sodium: 135
Total Bilirubin: 0.6
Total Protein: 6.4

## 2011-06-21 LAB — URINALYSIS, ROUTINE W REFLEX MICROSCOPIC
Bilirubin Urine: NEGATIVE
Glucose, UA: NEGATIVE
Hgb urine dipstick: NEGATIVE
Ketones, ur: NEGATIVE
Specific Gravity, Urine: 1.006
pH: 6

## 2011-06-21 LAB — URINE MICROSCOPIC-ADD ON

## 2011-06-21 LAB — CBC
HCT: 35.6 — ABNORMAL LOW
Hemoglobin: 12.2
RBC: 4.13
RDW: 12.4
WBC: 7.5

## 2011-06-21 LAB — DIFFERENTIAL
Basophils Absolute: 0
Basophils Relative: 0
Eosinophils Absolute: 0.2
Monocytes Relative: 7
Neutrophils Relative %: 56

## 2011-06-21 LAB — RAPID URINE DRUG SCREEN, HOSP PERFORMED
Barbiturates: NOT DETECTED
Cocaine: NOT DETECTED
Opiates: NOT DETECTED

## 2011-06-21 LAB — ETHANOL: Alcohol, Ethyl (B): <5

## 2011-06-21 NOTE — Discharge Summary (Signed)
  NAMEAUTUM, BENFER                   ACCOUNT NO.:  [id number]  MEDICAL RECORD NO.:  [id number]  LOCATION:  1610                          FACILITY:  [facility]  PHYSICIAN:  Franchot Gallo, MD     DATE OF BIRTH:  Mar 11, 1956  DATE OF ADMISSION:  06/13/2011 DATE OF DISCHARGE:  06/18/2011                              DISCHARGE SUMMARY   HISTORY OF PRESENT ILLNESS:  This is a 55 year old female who was admitted saying that she "wanted to hurt herself."  She was expressing vague thoughts of self-harm with complaints of auditory and visual hallucinations.  She also was reporting problems with panic attacks.  FINAL IMPRESSION:  Axis I:  Schizoaffective disorder, depressed type. Axis III:  None. Axis III:  History of hypothyroidism, neuropathy, and history of back pain. Axis IV:  Chronic pain.  Multiple health issues. Axis V:  GAF at discharge is 65.  PERTINENT LABORATORY:  Glucose is elevated at 183.  No measurable alcohol.  The patient was improving.  She was reporting good sleep, good appetite, having mild depressive symptoms, rating it a 4 on a scale of 1-10. Denied any suicidal or homicidal thoughts.  She was endorsing having mumbling auditory hallucinations that she could not make out.  Denied any delusional thinking and reporting mild-to-moderate anxiety.  Her TSH at that time was 0.017.  CBG at 123 that morning.  The patient was feeling much better, rating her depression and anxiety a 3 on a scale of 1 to 10.  Denied any suicidal or homicidal thoughts. She was provided information on [employer].  Over the weekend, the patient again continued to endorse good sleep and good appetite, rating her depression at 2 to 3.  She denied any hopelessness or helplessness, and her medication regimen was continued.  On the day of discharge, her sleep was good, appetite was good.  Having mild depressive symptoms, rating it a 2 on a scale of 1 to 10. Adamantly denying any suicidal or homicidal  thoughts or auditory hallucinations or delusional thinking, and showing no medication side effects.  DISCHARGE MEDICATIONS: 1. Xanax 0.5 mg 1 b.i.d. p.r.n. 2. Lantus insulin 15 units subcu at bedtime. 3. Lantus insulin 10 units daily at 7:00 am. 4. Lidocaine patches to be changed every 24 hours. 5. Trazodone 100 mg q.h.s. p.r.n. for sleep. 6. Celexa 40 mg 1 daily. 7. Claritin 10 mg daily. 8. Colace 1 tab b.i.d. 9. Levoxyl 125 mcg 1 daily. 10.Metformin 500 mg 1 b.i.d. 11.Robaxin 500 mg 1 t.i.d. p.r.n. for back spasms. 12.Neurontin 600 mg 1 q.i.d. 13.Zyprexa 20 mg q.h.s. 14.Trazodone 100 mg q.h.s.  Her followup appointment was with [facility] ACT team, phone number [phone], on Tuesday, October 9th, at 12:00 a.m.     Landry Corporal, N.P.   ______________________________ Franchot Gallo, MD    JO/MEDQ  D:  06/19/2011  T:  06/20/2011  Job:  [id number]  Electronically Signed by Limmie PatriciaP. on 06/21/2011 09:23:33 AM Electronically Signed by Franchot Gallo MD on 06/21/2011 03:55:22 PM

## 2011-06-25 NOTE — Assessment & Plan Note (Signed)
Cheryl [name], Cheryl [name]                   ACCOUNT NO.:  [id number]  MEDICAL RECORD NO.:  [id number]  LOCATION:  1610                          FACILITY:  [facility]  PHYSICIAN:  Franchot Gallo, MD     DATE OF BIRTH:  Dec 14, 1955  DATE OF ADMISSION:  06/13/2011 DATE OF DISCHARGE:                      PSYCHIATRIC ADMISSION ASSESSMENT   PATIENT IDENTIFICATION:  This is a 55 year old divorced white female. This is a voluntary admission to the services of Dr. Harvie Heck Readling.  HISTORY OF PRESENT ILLNESS:  She presented at the [facility] ED.  She told them that "I want to hurt myself."  She did have a telepsychiatry interview.  Apparently, she told them that she had been brought to the ER by her ACT team after expressing vague thoughts of self-harm with complaints of auditory and visual hallucinations.  She reports that she has been diagnosed with schizophrenia and has daily panic attack.  She is actually diagnosed with schizoaffective.  They also questioned her description of panic attacks and AB hallucinations.  The telepsychiatry people wondered if there was secondary gain associated with her presentation and wanting to be hospitalized.  They did not find any evidence for delusional thought processes or cognitive impairment.  She did have some vegetative signs and symptoms from depression that she states is stress by her current financial issues.  Based on her history for 2 prior attempts and a family history of suicide, it was felt that she might benefit from a psychiatric admission at this time.  PAST PSYCHIATRIC HISTORY:  She was last with [place] 06/26 to 07/10.  She has had numerous hospitalizations here and at [facility].  Her family history is remarkable in that her brother suicided according to the telepsychiatry report.  SOCIAL HISTORY:  She tells me she went to the tenth grade.  She has been married and divorced twice.  She has been living with this boyfriend for 10 years, also, her  daughter who is 56.  Her daughter is disabled due to her back.  She also receives SSDI herself.  ALCOHOL AND DRUG HISTORY:  She denies.  There is no indication for that as an issue.  PRIMARY CARE PROVIDER:  She reports is [facility] or Dr. Roseanne Reno up at the [facility].  MEDICAL PROBLEMS:  From when she has been with [place] before, she is known to have hypothyroidism, peripheral neuropathy and back pain.  MEDICATIONS:  Currently, she is thought to be prescribed: 1. Zyprexa 15 mg at bedtime. 2. Celexa 40 mg p.o. daily. 3. Colace 100 mg b.i.d. 4. Xanax 0.5 mg t.i.d. 5. Vicodin 5/500.  Does not say how often. 6. Claritin 10 mg p.o. daily. 7. Neurontin 600 mg p.o. t.i.d.  Of note, she had no substances in her urine drug screen, and that may be why she is here.  She could very well be out of her opiates and her Klonopin.  These were not reordered on admission.  DRUG ALLERGIES:  Sulfa.  POSITIVE PHYSICAL FINDINGS:  She was medically cleared in the ED at [facility].  Her temperature was afebrile 97.7-98.2.  Her pulse was 88- 108, respirations were 15-20 and blood pressure was 106/71  to 129/87. Her hemoglobin was slightly low at 11.9.  She had no measurable alcohol. Her glucose was elevated at 183 and came down to 98.  She had no other remarkable labs or physical findings.  Tonight she is alert and oriented.  She is in her room.  She is casually dressed in hospital scrubs.  Her speech is normal rate, rhythm and tone.  Her thought process is intact.  She does not have any stated goal.  She states that she feels safe being here in the hospital, although she does endorse having heard voices within the past hour.  Judgment and insight are poor.  Concentration and memory appear to be superficially intact. Intelligence is normal to below normal.  DISCHARGE DIAGNOSES:  AXIS I:  Schizoaffective disorder. AXIS II:  Rule out borderline IQ. AXIS III:  History for back pain,  hypothyroidism, neuropathy. AXIS IV:  Issues with chronic illness and at probably over utilizing her controlled substances. AXIS V:  50.  PLAN:  The plan is to admit for safety and stabilization.  Her medications will be adjusted as indicated.  She will resume her outpatient care at the ACT team.  Estimated length of stay is hopefully 3-5 days.     Mickie Leonarda Salon, P.A.-C.   ______________________________ Franchot Gallo, MD    MD/MEDQ  D:  06/13/2011  T:  06/14/2011  Job:  [id number]  Electronically Signed by Jaci Lazier ADAMS P.A.-C. on 06/25/2011 11:47:30 AM Electronically Signed by Franchot Gallo MD on 06/25/2011 01:24:41 PM

## 2011-06-27 LAB — HEPATIC FUNCTION PANEL
ALT: 21
AST: 19
Alkaline Phosphatase: 61
Bilirubin, Direct: 0.1
Indirect Bilirubin: 0.6

## 2011-06-27 LAB — CBC
Hemoglobin: 12.6
Hemoglobin: 13.5
MCHC: 34.1
MCHC: 34.3
MCV: 87.3
MCV: 87.3
RBC: 4.23
RBC: 4.51
RDW: 12.9

## 2011-06-27 LAB — BASIC METABOLIC PANEL
BUN: 11
CO2: 30
Calcium: 8.9
Chloride: 100
Chloride: 99
Creatinine, Ser: 0.86
GFR calc Af Amer: >60
GFR calc non Af Amer: 59 — ABNORMAL LOW
Glucose, Bld: 104 — ABNORMAL HIGH
Glucose, Bld: 106 — ABNORMAL HIGH
Potassium: 3.8
Sodium: 138
Sodium: 139

## 2011-06-27 LAB — RAPID URINE DRUG SCREEN, HOSP PERFORMED
Amphetamines: NOT DETECTED
Tetrahydrocannabinol: NOT DETECTED

## 2011-06-27 LAB — DIFFERENTIAL
Basophils Absolute: 0
Basophils Relative: 0
Basophils Relative: 0
Eosinophils Absolute: 0.1
Eosinophils Absolute: 0.1
Eosinophils Relative: 2
Monocytes Absolute: 0.5
Monocytes Absolute: 0.6
Monocytes Relative: 7
Monocytes Relative: 7

## 2011-06-27 LAB — URINALYSIS, ROUTINE W REFLEX MICROSCOPIC
Bilirubin Urine: NEGATIVE
Nitrite: NEGATIVE
Protein, ur: NEGATIVE
Urobilinogen, UA: 0.2

## 2011-06-27 LAB — URINE MICROSCOPIC-ADD ON

## 2011-08-30 ENCOUNTER — Emergency Department (HOSPITAL_COMMUNITY)
Admission: EM | Admit: 2011-08-30 | Discharge: 2011-08-31 | Disposition: A | Discharge status: Home / Self Care | Payer: Medicare Other | Attending: Emergency Medicine | Admitting: Emergency Medicine

## 2011-08-30 DIAGNOSIS — R197 Diarrhea, unspecified: Secondary | ICD-10-CM | POA: Insufficient documentation

## 2011-08-30 DIAGNOSIS — F209 Schizophrenia, unspecified: Secondary | ICD-10-CM

## 2011-08-30 DIAGNOSIS — R5381 Other malaise: Secondary | ICD-10-CM | POA: Insufficient documentation

## 2011-08-30 DIAGNOSIS — Z79899 Other long term (current) drug therapy: Secondary | ICD-10-CM | POA: Insufficient documentation

## 2011-08-30 DIAGNOSIS — M549 Dorsalgia, unspecified: Secondary | ICD-10-CM | POA: Insufficient documentation

## 2011-08-30 DIAGNOSIS — R112 Nausea with vomiting, unspecified: Secondary | ICD-10-CM | POA: Insufficient documentation

## 2011-08-30 DIAGNOSIS — R109 Unspecified abdominal pain: Secondary | ICD-10-CM | POA: Insufficient documentation

## 2011-08-30 DIAGNOSIS — R6883 Chills (without fever): Secondary | ICD-10-CM | POA: Insufficient documentation

## 2011-08-30 DIAGNOSIS — F341 Dysthymic disorder: Secondary | ICD-10-CM | POA: Insufficient documentation

## 2011-08-30 DIAGNOSIS — E079 Disorder of thyroid, unspecified: Secondary | ICD-10-CM | POA: Insufficient documentation

## 2011-08-30 DIAGNOSIS — E119 Type 2 diabetes mellitus without complications: Secondary | ICD-10-CM | POA: Insufficient documentation

## 2011-08-30 DIAGNOSIS — R45851 Suicidal ideations: Secondary | ICD-10-CM | POA: Insufficient documentation

## 2011-08-30 HISTORY — DX: Bipolar disorder, unspecified: F31.9

## 2011-08-30 HISTORY — DX: Depression, unspecified: F32.A

## 2011-08-30 HISTORY — DX: Disorder of thyroid, unspecified: E07.9

## 2011-08-30 HISTORY — DX: Major depressive disorder, single episode, unspecified: F32.9

## 2011-08-30 HISTORY — DX: Schizophrenia, unspecified: F20.9

## 2011-08-30 HISTORY — DX: Anxiety disorder, unspecified: F41.9

## 2011-08-30 HISTORY — DX: Suicidal ideations: R45.851

## 2011-08-30 LAB — BASIC METABOLIC PANEL
BUN: 8 mg/dL (ref 6–23)
CO2: 26 mEq/L (ref 19–32)
Chloride: 105 mEq/L (ref 96–112)
GFR calc non Af Amer: >90 mL/min (ref >90)
Glucose, Bld: 134 mg/dL — ABNORMAL HIGH (ref 70–99)
Potassium: 4.1 mEq/L (ref 3.5–5.1)
Sodium: 141 mEq/L (ref 135–145)

## 2011-08-30 LAB — RAPID URINE DRUG SCREEN, HOSP PERFORMED
Benzodiazepines: NOT DETECTED
Cocaine: NOT DETECTED
Opiates: NOT DETECTED
Tetrahydrocannabinol: NOT DETECTED

## 2011-08-30 LAB — CBC
HCT: 37.3 % (ref 36.0–46.0)
Hemoglobin: 12.2 g/dL (ref 12.0–15.0)
RBC: 4.37 MIL/uL (ref 3.87–5.11)
WBC: 7.8 10*3/uL (ref 4.0–10.5)

## 2011-08-30 LAB — GLUCOSE, CAPILLARY
Glucose-Capillary: 98 mg/dL (ref 70–99)
Glucose-Capillary: 128 mg/dL — ABNORMAL HIGH (ref 70–99)

## 2011-08-30 LAB — URINALYSIS, ROUTINE W REFLEX MICROSCOPIC
Glucose, UA: NEGATIVE mg/dL
Hgb urine dipstick: NEGATIVE
Specific Gravity, Urine: 1.018 (ref 1.005–1.030)
Urobilinogen, UA: 1 mg/dL (ref 0.0–1.0)
pH: 7 (ref 5.0–8.0)

## 2011-08-30 MED ORDER — ALPRAZOLAM 0.5 MG PO TABS
0.5000 mg | ORAL_TABLET | Freq: Two times a day (BID) | ORAL | Status: DC
  Administered 2011-08-30: 0.5 mg via ORAL
  Filled 2011-08-30: qty 1

## 2011-08-30 MED ORDER — ONDANSETRON HCL 4 MG PO TABS: 4.0000 mg | ORAL_TABLET | Freq: Three times a day (TID) | ORAL | Status: DC | PRN

## 2011-08-30 MED ORDER — LEVOTHYROXINE SODIUM 125 MCG PO TABS: 125.0000 ug | ORAL_TABLET | Freq: Every day | ORAL | Status: DC

## 2011-08-30 MED ORDER — SODIUM CHLORIDE 0.9 % IV SOLN
1000.0000 mL | Freq: Once | INTRAVENOUS | Status: AC
  Administered 2011-08-30: 1000 mL via INTRAVENOUS

## 2011-08-30 MED ORDER — ONDANSETRON HCL 4 MG/2ML IJ SOLN
4.0000 mg | Freq: Once | INTRAMUSCULAR | Status: AC
  Administered 2011-08-30: 4 mg via INTRAVENOUS
  Filled 2011-08-30: qty 2

## 2011-08-30 MED ORDER — ACETAMINOPHEN 325 MG PO TABS
650.0000 mg | ORAL_TABLET | ORAL | Status: DC | PRN
  Administered 2011-08-31: 650 mg via ORAL
  Filled 2011-08-30: qty 2

## 2011-08-30 MED ORDER — ALPRAZOLAM 0.5 MG PO TABS
0.5000 mg | ORAL_TABLET | Freq: Two times a day (BID) | ORAL | Status: DC
  Administered 2011-08-31: 0.5 mg via ORAL
  Filled 2011-08-30: qty 1

## 2011-08-30 MED ORDER — CITALOPRAM HYDROBROMIDE 40 MG PO TABS
40.0000 mg | ORAL_TABLET | Freq: Every day | ORAL | Status: DC
  Administered 2011-08-31: 40 mg via ORAL
  Filled 2011-08-30: qty 1

## 2011-08-30 MED ORDER — TRAZODONE HCL 50 MG PO TABS: 50.0000 mg | ORAL_TABLET | Freq: Every day | ORAL | Status: DC

## 2011-08-30 MED ORDER — PROMETHAZINE HCL 25 MG/ML IJ SOLN
25.0000 mg | INTRAMUSCULAR | Status: AC
  Administered 2011-08-30: 25 mg via INTRAVENOUS
  Filled 2011-08-30: qty 1

## 2011-08-30 MED ORDER — LEVOTHYROXINE SODIUM 125 MCG PO TABS
125.0000 ug | ORAL_TABLET | Freq: Every day | ORAL | Status: DC
  Administered 2011-08-31: 125 ug via ORAL
  Filled 2011-08-30: qty 1

## 2011-08-30 MED ORDER — ACETAMINOPHEN 325 MG PO TABS
650.0000 mg | ORAL_TABLET | Freq: Once | ORAL | Status: AC
  Administered 2011-08-30: 650 mg via ORAL
  Filled 2011-08-30: qty 2

## 2011-08-30 MED ORDER — LORAZEPAM 1 MG PO TABS: 1.0000 mg | ORAL_TABLET | Freq: Three times a day (TID) | ORAL | Status: DC | PRN

## 2011-08-30 MED ORDER — OLANZAPINE 5 MG PO TABS: 10.0000 mg | ORAL_TABLET | Freq: Every day | ORAL | Status: DC

## 2011-08-30 MED ORDER — CITALOPRAM HYDROBROMIDE 40 MG PO TABS: 40.0000 mg | ORAL_TABLET | Freq: Every day | ORAL | Status: DC

## 2011-08-30 NOTE — ED Notes (Signed)
Pt speaking with telepsych MD

## 2011-08-30 NOTE — ED Notes (Signed)
[place] with ACT team states pt is on list to be seen.

## 2011-08-30 NOTE — ED Notes (Signed)
Act team alerted that pt family is discussing with pt that she does not need to stay in hospital and should not discuss with the telepsych consult her depression issues because then she will not be at home for christmas.

## 2011-08-30 NOTE — ED Notes (Signed)
Pt states vomiting x2wks, states unable to keep down her psych meds

## 2011-08-30 NOTE — ED Notes (Signed)
Called md to get psych holding orders. md stated he wants pt to have tele psych conference call first. Spoke with ACT team and stated they were waiting for a call back. Will let RN know.

## 2011-08-30 NOTE — ED Provider Notes (Addendum)
Medical screening examination/treatment/procedure(s) were conducted as a shared visit with non-physician practitioner(s) and myself.  I personally evaluated the patient during the encounter  Patient seen by me patient well has been turned over to act team for suicidal ideation and most likely depression. Acting states that patient is on the list to be seen orders to move to psyche ED. have been entered. Patient has been medically cleared. Based on our assessment patient needs psychiatric consultation prior to formal discharge and may require admission.  Shelda Jakes, MD 08/30/11 1707  Addendum: After patient was seen by behavioral health team requested telemetry psychiatric consult which was done. Psychiatry recommended formal admission the patient was moved back to the psychiatric holding area. [place] to psychiatry patient needs acute inpatient psychiatric admission due to the risk of self-harm she had expressed suicidal ideation. In addition he had recommendations for medication Celexa 40 mg daily Xanax 0.5 mg twice a day Synthroid 0.125 mg daily Zyprexa 10 mg daily and trazodone 50 mg at bedtime. Psychiatric holding orders completed.  I saw and evaluated the patient, reviewed the resident's note and I agree with the findings and plan. See above.   Shelda Jakes, MD 08/30/11 1610  Shelda Jakes, MD 08/30/11 [phone]

## 2011-08-30 NOTE — ED Notes (Addendum)
About a month ago-broke up with boyfriend of many years. Since then has not been able to keep any meds down--throws all meds back up-unable to keep psych meds down- starting to hear voices telling her to hurt herself. Patient states that she has wanted to hurt herself today.  Family at bedside. States that whenever her thyroid med is not right, she starts wanting to hurt herself.

## 2011-08-30 NOTE — ED Notes (Signed)
Spoke with Dr. Deretha Emory regarding pt and lack of psych holding orders. States he will put in orders.

## 2011-08-30 NOTE — ED Notes (Signed)
Pt BIB daughter from home for c/o nausea/vomiting for couple of weeks, pt reports inability to keep psych meds/diabetes meds down which is causing her to feel depressed and suicidal, no plan/intent. Pt also reports she recently separated from her BF of 12 years and is now living with her daughter. Pt reports active SI/AH while here in the hospital, however able to CFS, states voices are telling her to hurt herself that she does not deserve to be here. Pt continues to have nausea, no vomiting/dry heaves, multiple inpt psych adm, suicide attempts x 2, last saw psychiatrist approx 2 mo ago.

## 2011-08-30 NOTE — ED Notes (Signed)
Daughter is leaving for a little bit.  [place] - [phone]

## 2011-08-30 NOTE — ED Notes (Signed)
Dr. Zackowski at bedside speaking with pt and family 

## 2011-08-30 NOTE — ED Provider Notes (Signed)
Patient given medications as treatment as recommended by a specialist on call psychiatry  Toy Baker, MD 08/30/11 2226

## 2011-08-30 NOTE — BH Assessment (Signed)
Assessment Note   Cheryl [name] is an 55 y.o. female. Pt presents w/SI reporting she is hearing commands telling her to kill herself and other negative things (Your not worth being alive, you are nothing).  Pt. Reports she has been vomiting up her current Medication and is unable to take meds.  Pt. Reports that she recently broke up with her live-in boyfriend after a 12 year relationship. Pt. Reports that she has vegetative symptoms and stays in the bed all day, secluding herself and not  Socializing with anyone.  Pt.'s daughter reports that she has not been able to keep meds down for the past two days and that is why she is hearing voices, she reports that otherwise pt. Does not hear the voices and is stable.  Daughter reports [facility] comes to see pt. Once a week and she is with pt 24/7.  Pt. Reports she has had two episodes of SI, taking sleeping pills and has been inpatient three times. Pt. Cannot recall her diagnosis. Pt was unable to recall past events or  Experiences.    Axis I: Psychotic Disorder NOS Axis II: Deferred Axis III:  Diabetic, thyroid Axis IV:  Recent loss of relationship Axis V:  36  Past Medical History:  Past Medical History  Diagnosis Date  . Thyroid disease   . Diabetes mellitus   . Depression   . Schizophrenia   . Bipolar affective   . Suicidal ideation   . Anxiety     History reviewed. No pertinent past surgical history.  Family History: No family history on file.  Social History:  does not have a smoking history on file. She does not have any smokeless tobacco history on file. She reports that she does not drink alcohol. Her drug history not on file.  Additional Social History:    Allergies:  Allergies  Allergen Reactions  . Sulfa Antibiotics Rash    Home Medications:  Medications Prior to Admission  Medication Dose Route Frequency Provider Last Rate Last Dose  . 0.9 %  sodium chloride infusion  1,000 mL Intravenous Once Majel Homer, MD 1,000  mL/hr at 08/30/11 1131 1,000 mL at 08/30/11 1131  . acetaminophen (TYLENOL) tablet 650 mg  650 mg Oral Once Majel Homer, MD   650 mg at 08/30/11 1505  . ondansetron (ZOFRAN) injection 4 mg  4 mg Intravenous Once Majel Homer, MD   4 mg at 08/30/11 1246  . ondansetron (ZOFRAN) injection 4 mg  4 mg Intravenous Once Shelda Jakes, MD   4 mg at 08/30/11 1751  . promethazine (PHENERGAN) injection 25 mg  25 mg Intravenous To ER Majel Homer, MD   25 mg at 08/30/11 1310   No current outpatient prescriptions on file as of 08/30/2011.    OB/GYN Status:  No LMP recorded.  General Assessment Data Location of Assessment: [facility] ED ACT Assessment: Yes Living Arrangements: Relatives Can pt return to current living arrangement?: Yes Admission Status: Voluntary Is patient capable of signing voluntary admission?: Yes Transfer from: Home Referral Source: Other  Education Status Is patient currently in school?: No  Risk to self Suicidal Ideation: Yes-Currently Present Suicidal Intent: Yes-Currently Present Is patient at risk for suicide?: Yes Suicidal Plan?: Yes-Currently Present Specify Current Suicidal Plan: cut self Access to Means: Yes Specify Access to Suicidal Means: knives in the home What has been your use of drugs/alcohol within the last 12 months?: none Previous Attempts/Gestures: Yes How many times?: 2  Other Self Harm  Risks: none Triggers for Past Attempts: Unpredictable Intentional Self Injurious Behavior: None Family Suicide History: Yes Recent stressful life event(s): Loss (Comment) Persecutory voices/beliefs?: Yes Depression: Yes Depression Symptoms: Tearfulness;Isolating;Loss of interest in usual pleasures;Feeling worthless/self pity Substance abuse history and/or treatment for substance abuse?: No Suicide prevention information given to non-admitted patients: Not applicable  Risk to Others Homicidal Ideation: No Thoughts of Harm to Others: No Current Homicidal Intent:  No Current Homicidal Plan: No Access to Homicidal Means: No Identified Victim: denies History of harm to others?: No Assessment of Violence: None Noted Violent Behavior Description: denies Does patient have access to weapons?: No Criminal Charges Pending?: No Does patient have a court date: No  Psychosis Hallucinations: Auditory Delusions: None noted  Mental Status Report Appear/Hygiene: Other (Comment) Eye Contact: Poor Motor Activity: Rigidity Speech: Logical/coherent Level of Consciousness: Alert Mood: Depressed Affect: Appropriate to circumstance Anxiety Level: None Thought Processes: Relevant Judgement: Impaired Orientation: Person;Place;Time;Situation Obsessive Compulsive Thoughts/Behaviors: None  Cognitive Functioning Concentration: Normal Memory: Recent Intact;Remote Intact IQ: Average Insight: Poor Impulse Control: Poor Appetite: Poor Weight Loss: 0  Weight Gain: 0  Sleep: Increased Total Hours of Sleep: 12  Vegetative Symptoms: Staying in bed;Not bathing;Decreased grooming  Prior Inpatient Therapy Prior Inpatient Therapy: Yes Prior Therapy Facilty/Provider(s): [facility], [facility], [facility] Reason for Treatment: MH  Prior Outpatient Therapy Prior Outpatient Therapy: Yes Prior Therapy Dates: 2012 Prior Therapy Facilty/Provider(s): [facility] Reason for Treatment: MH            Values / Beliefs Cultural Requests During Hospitalization: None Spiritual Requests During Hospitalization: None        Additional Information Elopement Risk: No Does patient have medical clearance?: No     Disposition:  Disposition Disposition of Patient: Referred to (referred to TelePsych)  On Site Evaluation by:   Reviewed with Physician:     Berdine Addison Rileigh Kawashima 08/30/2011 6:34 PM

## 2011-08-30 NOTE — ED Notes (Signed)
ACT team at bedside.  
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
## 2011-08-31 DIAGNOSIS — F209 Schizophrenia, unspecified: Secondary | ICD-10-CM

## 2011-08-31 LAB — GLUCOSE, CAPILLARY: Glucose-Capillary: 121 mg/dL — ABNORMAL HIGH (ref 70–99)

## 2011-08-31 NOTE — Consult Note (Signed)
Patient Identification:  Enaya Howze Capps Date of Evaluation:  08/31/2011   History of Present Illness:  I saw the patient and reviewed the medical records.   I also met with patient's daughter.   Currently patient is logical and goal-directed not hallucinating or delusional she is not suicidal or homicidal. Patient reported that because of the vomiting she was not able to take Medications and was upset and that's why she verbalized that she wanted to hurt herself but now the daughter wants take her home she lives with her 24 hours and takes care of her all the time. I asked patient about her medications she told me about medications and how she take it she is alert awake oriented x3 her insight and judgment is intact abstraction ability is good. Patient is also ready to sign the safety contract .  as patient is compliant with her medications her current mental status is within normal limits.she has a 24-hour support from her daughter she is at low risk to hurt herself and can be discharged to followup in the outpatient setting.  Past Medical History:     Past Medical History  Diagnosis Date  . Thyroid disease   . Diabetes mellitus   . Depression   . Schizophrenia   . Bipolar affective   . Suicidal ideation   . Anxiety       History reviewed. No pertinent past surgical history.  Filed Vitals:   08/31/11 0530  BP: 132/80  Pulse: 89  Temp: 97.6 F (36.4 C)  Resp: 16    Lab Results:   BMET    Component Value Date/Time   NA 141 08/30/2011 1114   K 4.1 08/30/2011 1114   CL 105 08/30/2011 1114   CO2 26 08/30/2011 1114   GLUCOSE 134* 08/30/2011 1114   BUN 8 08/30/2011 1114   CREATININE 0.75 08/30/2011 1114   CALCIUM 9.1 08/30/2011 1114   GFRNONAA >90 08/30/2011 1114   GFRAA >90 08/30/2011 1114    Allergies:  Allergies  Allergen Reactions  . Sulfa Antibiotics Rash    Current Medications:  Prior to Admission medications   Medication Sig Start Date End Date Taking?  Authorizing Provider  ALPRAZolam Prudy Feeler) 0.5 MG tablet Take 0.5 mg by mouth at bedtime as needed.     Yes Historical Provider, MD  citalopram (CELEXA) 20 MG tablet Take 20 mg by mouth daily.     Yes Historical Provider, MD  citalopram (CELEXA) 40 MG tablet Take 40 mg by mouth daily.     Yes Historical Provider, MD  docusate sodium (COLACE) 100 MG capsule Take 100 mg by mouth 2 (two) times daily.     Yes Historical Provider, MD  gabapentin (NEURONTIN) 600 MG tablet Take 600 mg by mouth 3 (three) times daily.     Yes Historical Provider, MD  insulin NPH-insulin regular (NOVOLIN 70/30) (70-30) 100 UNIT/ML injection Inject 20-35 Units into the skin 2 (two) times daily with a meal.     Yes Historical Provider, MD  levothyroxine (SYNTHROID, LEVOTHROID) 125 MCG tablet Take 125 mcg by mouth daily.     Yes Historical Provider, MD  loratadine (CLARITIN) 10 MG tablet Take 10 mg by mouth daily.     Yes Historical Provider, MD  metFORMIN (GLUCOPHAGE) 500 MG tablet Take 500 mg by mouth 2 (two) times daily with a meal.     Yes Historical Provider, MD  OLANZapine (ZYPREXA) 10 MG tablet Take 10 mg by mouth at bedtime.  Yes Historical Provider, MD  traZODone (DESYREL) 100 MG tablet Take 100 mg by mouth at bedtime.     Yes Historical Provider, MD    Social History:    does not have a smoking history on file. She does not have any smokeless tobacco history on file. She reports that she does not drink alcohol. Her drug history not on file.   Family History:    No family history on file.   DIAGNOSIS:   AXIS I  by history schizophrenia paranoid type   AXIS II  Deffered  AXIS III See medical notes.  AXIS IV  chronic mental health issues   AXIS V 60     Recommendations:  Patient can be discharged to followup in the outpatient setting.   Eulogio Ditch, MD

## 2011-08-31 NOTE — ED Notes (Signed)
Pt. Offered shower.

## 2011-08-31 NOTE — ED Notes (Signed)
Pt daughter at bedside

## 2011-11-17 ENCOUNTER — Emergency Department (HOSPITAL_COMMUNITY): Payer: Medicare Other

## 2011-11-17 ENCOUNTER — Encounter (HOSPITAL_COMMUNITY): Payer: Self-pay | Admitting: *Deleted

## 2011-11-17 ENCOUNTER — Other Ambulatory Visit: Payer: Self-pay

## 2011-11-17 ENCOUNTER — Inpatient Hospital Stay (HOSPITAL_COMMUNITY)
Admission: EM | Admit: 2011-11-17 | Discharge: 2011-11-24 | DRG: 177 | Disposition: A | Discharge status: Home / Self Care | Payer: Medicare Other | Attending: Internal Medicine | Admitting: Internal Medicine

## 2011-11-17 DIAGNOSIS — K5900 Constipation, unspecified: Secondary | ICD-10-CM | POA: Diagnosis present

## 2011-11-17 DIAGNOSIS — K219 Gastro-esophageal reflux disease without esophagitis: Secondary | ICD-10-CM | POA: Diagnosis present

## 2011-11-17 DIAGNOSIS — E119 Type 2 diabetes mellitus without complications: Secondary | ICD-10-CM | POA: Diagnosis present

## 2011-11-17 DIAGNOSIS — D649 Anemia, unspecified: Secondary | ICD-10-CM | POA: Diagnosis present

## 2011-11-17 DIAGNOSIS — M545 Low back pain, unspecified: Secondary | ICD-10-CM | POA: Diagnosis present

## 2011-11-17 DIAGNOSIS — J189 Pneumonia, unspecified organism: Secondary | ICD-10-CM | POA: Diagnosis present

## 2011-11-17 DIAGNOSIS — J9600 Acute respiratory failure, unspecified whether with hypoxia or hypercapnia: Secondary | ICD-10-CM | POA: Diagnosis present

## 2011-11-17 DIAGNOSIS — R51 Headache: Secondary | ICD-10-CM

## 2011-11-17 DIAGNOSIS — G8929 Other chronic pain: Secondary | ICD-10-CM | POA: Diagnosis present

## 2011-11-17 DIAGNOSIS — E1149 Type 2 diabetes mellitus with other diabetic neurological complication: Secondary | ICD-10-CM | POA: Diagnosis present

## 2011-11-17 DIAGNOSIS — Z6836 Body mass index (BMI) 36.0-36.9, adult: Secondary | ICD-10-CM

## 2011-11-17 DIAGNOSIS — J019 Acute sinusitis, unspecified: Secondary | ICD-10-CM | POA: Diagnosis present

## 2011-11-17 DIAGNOSIS — R0602 Shortness of breath: Secondary | ICD-10-CM

## 2011-11-17 DIAGNOSIS — J690 Pneumonitis due to inhalation of food and vomit: Principal | ICD-10-CM | POA: Diagnosis present

## 2011-11-17 DIAGNOSIS — A088 Other specified intestinal infections: Secondary | ICD-10-CM | POA: Diagnosis present

## 2011-11-17 DIAGNOSIS — F319 Bipolar disorder, unspecified: Secondary | ICD-10-CM | POA: Diagnosis present

## 2011-11-17 DIAGNOSIS — M549 Dorsalgia, unspecified: Secondary | ICD-10-CM

## 2011-11-17 DIAGNOSIS — R112 Nausea with vomiting, unspecified: Secondary | ICD-10-CM | POA: Diagnosis present

## 2011-11-17 DIAGNOSIS — K3184 Gastroparesis: Secondary | ICD-10-CM | POA: Diagnosis present

## 2011-11-17 DIAGNOSIS — E039 Hypothyroidism, unspecified: Secondary | ICD-10-CM | POA: Diagnosis present

## 2011-11-17 HISTORY — DX: Pneumonia, unspecified organism: J18.9

## 2011-11-17 HISTORY — DX: Shortness of breath: R06.02

## 2011-11-17 HISTORY — DX: Acute upper respiratory infection, unspecified: J06.9

## 2011-11-17 HISTORY — DX: Hypothyroidism, unspecified: E03.9

## 2011-11-17 HISTORY — DX: Unspecified osteoarthritis, unspecified site: M19.90

## 2011-11-17 LAB — COMPREHENSIVE METABOLIC PANEL
ALT: 32 U/L (ref 0–35)
BUN: 10 mg/dL (ref 6–23)
Calcium: 8.7 mg/dL (ref 8.4–10.5)
Creatinine, Ser: 0.79 mg/dL (ref 0.50–1.10)
GFR calc Af Amer: >90 mL/min (ref >90)
GFR calc non Af Amer: >90 mL/min (ref >90)
Glucose, Bld: 108 mg/dL — ABNORMAL HIGH (ref 70–99)
Sodium: 136 mEq/L (ref 135–145)
Total Protein: 7.1 g/dL (ref 6.0–8.3)

## 2011-11-17 LAB — CBC
HCT: 35.5 % — ABNORMAL LOW (ref 36.0–46.0)
MCH: 28.8 pg (ref 26.0–34.0)
MCV: 89 fL (ref 78.0–100.0)
Platelets: 279 10*3/uL (ref 150–400)
RBC: 3.99 MIL/uL (ref 3.87–5.11)

## 2011-11-17 LAB — DIFFERENTIAL
Eosinophils Absolute: 0.4 10*3/uL (ref 0.0–0.7)
Eosinophils Relative: 4 % (ref 0–5)
Lymphs Abs: 3 10*3/uL (ref 0.7–4.0)
Monocytes Absolute: 0.9 10*3/uL (ref 0.1–1.0)

## 2011-11-17 LAB — URINALYSIS, ROUTINE W REFLEX MICROSCOPIC
Nitrite: NEGATIVE
Protein, ur: NEGATIVE mg/dL
Specific Gravity, Urine: 1.018 (ref 1.005–1.030)
Urobilinogen, UA: 0.2 mg/dL (ref 0.0–1.0)

## 2011-11-17 LAB — GLUCOSE, CAPILLARY: Glucose-Capillary: 158 mg/dL — ABNORMAL HIGH (ref 70–99)

## 2011-11-17 LAB — LACTIC ACID, PLASMA: Lactic Acid, Venous: 0.6 mmol/L (ref 0.5–2.2)

## 2011-11-17 LAB — LIPASE, BLOOD: Lipase: 17 U/L (ref 11–59)

## 2011-11-17 MED ORDER — MORPHINE SULFATE 4 MG/ML IJ SOLN
4.0000 mg | Freq: Once | INTRAMUSCULAR | Status: AC
  Administered 2011-11-17: 4 mg via INTRAVENOUS
  Filled 2011-11-17: qty 1

## 2011-11-17 MED ORDER — DEXTROSE 5 % IV SOLN
500.0000 mg | INTRAVENOUS | Status: DC
  Administered 2011-11-17 – 2011-11-22 (×6): 500 mg via INTRAVENOUS
  Filled 2011-11-17 (×7): qty 500

## 2011-11-17 MED ORDER — DOCUSATE SODIUM 100 MG PO CAPS
100.0000 mg | ORAL_CAPSULE | Freq: Two times a day (BID) | ORAL | Status: DC
  Administered 2011-11-17 – 2011-11-24 (×14): 100 mg via ORAL
  Filled 2011-11-17 (×15): qty 1

## 2011-11-17 MED ORDER — ONDANSETRON HCL 4 MG/2ML IJ SOLN: 4.0000 mg | Freq: Four times a day (QID) | INTRAMUSCULAR | Status: DC | PRN

## 2011-11-17 MED ORDER — LEVOTHYROXINE SODIUM 125 MCG PO TABS
125.0000 ug | ORAL_TABLET | Freq: Every day | ORAL | Status: DC
  Administered 2011-11-17 – 2011-11-24 (×8): 125 ug via ORAL
  Filled 2011-11-17 (×8): qty 1

## 2011-11-17 MED ORDER — ENOXAPARIN SODIUM 40 MG/0.4ML ~~LOC~~ SOLN
40.0000 mg | SUBCUTANEOUS | Status: DC
  Administered 2011-11-17 – 2011-11-23 (×7): 40 mg via SUBCUTANEOUS
  Filled 2011-11-17 (×8): qty 0.4

## 2011-11-17 MED ORDER — ALPRAZOLAM 0.5 MG PO TABS
0.5000 mg | ORAL_TABLET | Freq: Three times a day (TID) | ORAL | Status: DC | PRN
  Administered 2011-11-17 – 2011-11-24 (×6): 0.5 mg via ORAL
  Filled 2011-11-17 (×6): qty 1

## 2011-11-17 MED ORDER — ACETAMINOPHEN 325 MG PO TABS
650.0000 mg | ORAL_TABLET | Freq: Four times a day (QID) | ORAL | Status: DC | PRN
  Administered 2011-11-18 – 2011-11-20 (×2): 650 mg via ORAL
  Filled 2011-11-17 (×3): qty 2

## 2011-11-17 MED ORDER — ONDANSETRON HCL 4 MG PO TABS: 4.0000 mg | ORAL_TABLET | Freq: Four times a day (QID) | ORAL | Status: DC | PRN

## 2011-11-17 MED ORDER — MORPHINE SULFATE 2 MG/ML IJ SOLN
1.0000 mg | INTRAMUSCULAR | Status: DC | PRN
  Administered 2011-11-19: 1 mg via INTRAVENOUS
  Filled 2011-11-17: qty 1

## 2011-11-17 MED ORDER — GABAPENTIN 600 MG PO TABS
600.0000 mg | ORAL_TABLET | Freq: Four times a day (QID) | ORAL | Status: DC
  Administered 2011-11-17: 600 mg via ORAL
  Filled 2011-11-17 (×5): qty 1

## 2011-11-17 MED ORDER — ONDANSETRON HCL 4 MG/2ML IJ SOLN
4.0000 mg | Freq: Once | INTRAMUSCULAR | Status: AC
  Administered 2011-11-17: 4 mg via INTRAVENOUS
  Filled 2011-11-17: qty 2

## 2011-11-17 MED ORDER — CITALOPRAM HYDROBROMIDE 40 MG PO TABS
40.0000 mg | ORAL_TABLET | Freq: Every day | ORAL | Status: DC
  Administered 2011-11-18: 40 mg via ORAL
  Filled 2011-11-17 (×3): qty 1

## 2011-11-17 MED ORDER — CITALOPRAM HYDROBROMIDE 20 MG PO TABS: 20.0000 mg | ORAL_TABLET | Freq: Every day | ORAL | Status: DC

## 2011-11-17 MED ORDER — PANTOPRAZOLE SODIUM 40 MG PO TBEC
40.0000 mg | DELAYED_RELEASE_TABLET | Freq: Every day | ORAL | Status: DC
  Administered 2011-11-18 – 2011-11-23 (×5): 40 mg via ORAL
  Filled 2011-11-17 (×7): qty 1

## 2011-11-17 MED ORDER — CITALOPRAM HYDROBROMIDE 20 MG PO TABS
20.0000 mg | ORAL_TABLET | Freq: Every day | ORAL | Status: DC
  Administered 2011-11-18 – 2011-11-24 (×6): 20 mg via ORAL
  Filled 2011-11-17 (×7): qty 1

## 2011-11-17 MED ORDER — IOHEXOL 300 MG/ML  SOLN
100.0000 mL | Freq: Once | INTRAMUSCULAR | Status: AC | PRN
  Administered 2011-11-17: 100 mL via INTRAVENOUS

## 2011-11-17 MED ORDER — SODIUM CHLORIDE 0.9 % IV BOLUS (SEPSIS)
1000.0000 mL | Freq: Once | INTRAVENOUS | Status: DC
  Administered 2011-11-17: 1000 mL via INTRAVENOUS

## 2011-11-17 MED ORDER — OLANZAPINE 10 MG PO TABS
10.0000 mg | ORAL_TABLET | Freq: Every day | ORAL | Status: DC
  Administered 2011-11-17 – 2011-11-23 (×7): 10 mg via ORAL
  Filled 2011-11-17 (×8): qty 1

## 2011-11-17 MED ORDER — ALBUTEROL SULFATE (5 MG/ML) 0.5% IN NEBU
2.5000 mg | INHALATION_SOLUTION | RESPIRATORY_TRACT | Status: DC
  Administered 2011-11-17 – 2011-11-20 (×13): 2.5 mg via RESPIRATORY_TRACT
  Filled 2011-11-17 (×14): qty 0.5

## 2011-11-17 MED ORDER — INSULIN ASPART PROT & ASPART (70-30 MIX) 100 UNIT/ML ~~LOC~~ SUSP
15.0000 [IU] | Freq: Two times a day (BID) | SUBCUTANEOUS | Status: DC
  Administered 2011-11-17 – 2011-11-24 (×12): 15 [IU] via SUBCUTANEOUS
  Filled 2011-11-17 (×2): qty 3

## 2011-11-17 MED ORDER — TRAZODONE HCL 100 MG PO TABS
100.0000 mg | ORAL_TABLET | Freq: Every day | ORAL | Status: DC
  Administered 2011-11-17 – 2011-11-23 (×7): 100 mg via ORAL
  Filled 2011-11-17 (×8): qty 1

## 2011-11-17 MED ORDER — DEXTROSE 5 % IV SOLN
500.0000 mg | Freq: Once | INTRAVENOUS | Status: DC
  Filled 2011-11-17: qty 500

## 2011-11-17 MED ORDER — SODIUM CHLORIDE 0.9 % IV BOLUS (SEPSIS)
1000.0000 mL | Freq: Once | INTRAVENOUS | Status: AC
  Administered 2011-11-17: 1000 mL via INTRAVENOUS

## 2011-11-17 MED ORDER — LORATADINE 10 MG PO TABS
10.0000 mg | ORAL_TABLET | Freq: Every day | ORAL | Status: DC
  Administered 2011-11-17 – 2011-11-24 (×8): 10 mg via ORAL
  Filled 2011-11-17 (×9): qty 1

## 2011-11-17 MED ORDER — DEXTROSE 5 % IV SOLN
1.0000 g | Freq: Once | INTRAVENOUS | Status: AC
  Administered 2011-11-17: 1 g via INTRAVENOUS
  Filled 2011-11-17: qty 10

## 2011-11-17 MED ORDER — INSULIN ASPART 100 UNIT/ML ~~LOC~~ SOLN
0.0000 [IU] | Freq: Three times a day (TID) | SUBCUTANEOUS | Status: DC
  Administered 2011-11-18 – 2011-11-24 (×8): 2 [IU] via SUBCUTANEOUS
  Filled 2011-11-17: qty 3
  Filled 2011-11-17: qty 0.15

## 2011-11-17 MED ORDER — ACETAMINOPHEN 650 MG RE SUPP: 650.0000 mg | Freq: Four times a day (QID) | RECTAL | Status: DC | PRN

## 2011-11-17 MED ORDER — HYDROCODONE-ACETAMINOPHEN 5-325 MG PO TABS
1.0000 | ORAL_TABLET | Freq: Four times a day (QID) | ORAL | Status: DC | PRN
  Administered 2011-11-17: 1 via ORAL
  Administered 2011-11-18 – 2011-11-19 (×4): 2 via ORAL
  Filled 2011-11-17 (×3): qty 2
  Filled 2011-11-17: qty 1
  Filled 2011-11-17 (×2): qty 2

## 2011-11-17 MED ORDER — SODIUM CHLORIDE 0.9 % IV SOLN
INTRAVENOUS | Status: AC
  Administered 2011-11-17: 1000 mL via INTRAVENOUS

## 2011-11-17 MED ORDER — IPRATROPIUM BROMIDE 0.02 % IN SOLN
0.5000 mg | RESPIRATORY_TRACT | Status: DC
  Administered 2011-11-17 – 2011-11-20 (×13): 0.5 mg via RESPIRATORY_TRACT
  Filled 2011-11-17 (×15): qty 2.5

## 2011-11-17 MED ORDER — DEXTROSE 5 % IV SOLN
1.0000 g | INTRAVENOUS | Status: DC
  Administered 2011-11-17 – 2011-11-22 (×6): 1 g via INTRAVENOUS
  Filled 2011-11-17 (×7): qty 10

## 2011-11-17 MED ORDER — LIDOCAINE 5 % EX PTCH
1.0000 | MEDICATED_PATCH | CUTANEOUS | Status: DC
  Administered 2011-11-17 – 2011-11-24 (×8): 1 via TRANSDERMAL
  Filled 2011-11-17 (×8): qty 1

## 2011-11-17 NOTE — ED Notes (Signed)
Admission MD at bedside.  

## 2011-11-17 NOTE — ED Notes (Signed)
Attempted to call report, Receiving Rn not ready

## 2011-11-17 NOTE — H&P (Signed)
PCP:   Dr. Roseanne Reno   Chief Complaint:  Shortness of breath (with mild hypoxia), cough, nausea/vomiting.  HPI: 56 year old female with a past medical history significant for hypothyroidism, diabetes mellitus (on insulin), bipolar affective disorder, chronic back pain and history of gastroesophageal reflux disease; who came to the hospital complaining of shortness of breath, cough and nausea/vomiting. Patient reports that her symptoms has been present for the last 3-4 days approximately and without improvement. She reports that he initially thought to have call for she will use an over-the-counter Alka-Seltzer plus without any improvement. She reports having some chills and went to the ED at [facility]. After evaluation there she was found with CAP and was started on Levaquin; patient due to her vomiting was unable to keep anything down including medications and return to [facility] ED for further evaluation and treatment. At arrival her O2 sats were 88% on RA; CTA was done r/o PE and demonstrating multilobar PNA. Due to inability to keep medication down [facility] was called for admission.   Allergies:   Allergies  Allergen Reactions  . Sulfa Antibiotics Rash      Past Medical History  Diagnosis Date  . Thyroid disease   . Diabetes mellitus   . Depression   . Schizophrenia   . Bipolar affective   . Suicidal ideation   . Anxiety     History reviewed. No pertinent past surgical history.  Prior to Admission medications   Medication Sig Start Date End Date Taking? Authorizing Provider  acetaminophen (TYLENOL) 500 MG tablet Take 1,000 mg by mouth every 6 (six) hours as needed. For pain   Yes Historical Provider, MD  albuterol (PROVENTIL HFA;VENTOLIN HFA) 108 (90 BASE) MCG/ACT inhaler Inhale 2 puffs into the lungs every 6 (six) hours as needed. For shortness of breath   Yes Historical Provider, MD  ALPRAZolam (XANAX) 0.5 MG tablet Take 0.5 mg by mouth 3 (three) times daily as needed. For anxiety    Yes Historical Provider, MD  citalopram (CELEXA) 20 MG tablet Take 20 mg by mouth daily.     Yes Historical Provider, MD  citalopram (CELEXA) 40 MG tablet Take 40 mg by mouth daily.     Yes Historical Provider, MD  DM-Phenylephrine-Acetaminophen (ALKA-SELTZER PLUS DAY COLD/FLU) 10-5-325 MG CAPS Take 2 capsules by mouth every 6 (six) hours as needed. For cold and flu   Yes Historical Provider, MD  docusate sodium (COLACE) 100 MG capsule Take 100 mg by mouth 2 (two) times daily.     Yes Historical Provider, MD  gabapentin (NEURONTIN) 600 MG tablet Take 600 mg by mouth 4 (four) times daily.    Yes Historical Provider, MD  HYDROcodone-acetaminophen (VICODIN) 5-500 MG per tablet Take 1 tablet by mouth 3 (three) times daily as needed. For pain   Yes Historical Provider, MD  insulin NPH-insulin regular (NOVOLIN 70/30) (70-30) 100 UNIT/ML injection Inject 20-26 Units into the skin See admin instructions. Take 26 units at 7am and 20 units and 7pm   Yes Historical Provider, MD  levothyroxine (SYNTHROID, LEVOTHROID) 125 MCG tablet Take 125 mcg by mouth daily.     Yes Historical Provider, MD  lidocaine (LIDODERM) 5 % Place 1 patch onto the skin daily. Remove & Discard patch within 12 hours or as directed by MD   Yes Historical Provider, MD  loratadine (CLARITIN) 10 MG tablet Take 10 mg by mouth daily.     Yes Historical Provider, MD  metFORMIN (GLUCOPHAGE) 500 MG tablet Take 500 mg by mouth  2 (two) times daily with a meal.     Yes Historical Provider, MD  OLANZapine (ZYPREXA) 10 MG tablet Take 10 mg by mouth at bedtime.     Yes Historical Provider, MD  promethazine (PHENERGAN) 25 MG tablet Take 25 mg by mouth every 6 (six) hours as needed. For nausea   Yes Historical Provider, MD  traZODone (DESYREL) 100 MG tablet Take 100 mg by mouth at bedtime.     Yes Historical Provider, MD    Social History:  reports that she has never smoked. She has never used smokeless tobacco. She reports that she does not drink  alcohol or use illicit drugs.  History reviewed. No pertinent family history.  Review of Systems:  Negative except as mentioned on history of present illness.  Physical Exam: Blood pressure 127/75, temperature 98.9 F (37.2 C), temperature source Oral, resp. rate 16, height 5\' 7"  (1.702 m), weight 105.235 kg (232 lb), SpO2 95.00%. General: Dry mucous membranes in appearance, Obese; no acute distress. Able to speak in short sentences. Complaining of nausea Head: Normocephalic, atraumatic. Eyes: PERRLA, no icterus, no nystagmus, EOMI. Oropharynx: no erythema, no exudates, good dentition. Dry mucous membranes appreciated on exam. Neck: supple, no thyromegaly. Resp: decreased air movement at bases (R >L); no wheezing; positive rhochi. Heart: RRR, no murmurs, no rubs or gallops. Abdomen: Soft, nontender, nondistended, positive bowel sounds. Extremities: No edema, no cyanosis or clubbing. Good pedal pulses bilaterally Neurologic exam: Alert, awake and oriented x3, cranial nerve 2-12 grossly intact, muscle strength 4/5 bilaterally and symmetrically; no focal neurologic deficit. Psych: Appropriate.   Labs on Admission:  Results for orders placed during the hospital encounter of 11/17/11 (from the past 48 hour(s))  GLUCOSE, CAPILLARY     Status: Abnormal   Collection Time   11/17/11  2:09 PM      Component Value Range Comment   Glucose-Capillary 119 (*) 70 - 99 (mg/dL)   CBC     Status: Abnormal   Collection Time   11/17/11  3:00 PM      Component Value Range Comment   WBC 9.0  4.0 - 10.5 (K/uL)    RBC 3.99  3.87 - 5.11 (MIL/uL)    Hemoglobin 11.5 (*) 12.0 - 15.0 (g/dL)    HCT 16.1 (*) 09.6 - 46.0 (%)    MCV 89.0  78.0 - 100.0 (fL)    MCH 28.8  26.0 - 34.0 (pg)    MCHC 32.4  30.0 - 36.0 (g/dL)    RDW 04.5  40.9 - 81.1 (%)    Platelets 279  150 - 400 (K/uL)   DIFFERENTIAL     Status: Normal   Collection Time   11/17/11  3:00 PM      Component Value Range Comment   Neutrophils Relative  52  43 - 77 (%)    Neutro Abs 4.7  1.7 - 7.7 (K/uL)    Lymphocytes Relative 33  12 - 46 (%)    Lymphs Abs 3.0  0.7 - 4.0 (K/uL)    Monocytes Relative 10  3 - 12 (%)    Monocytes Absolute 0.9  0.1 - 1.0 (K/uL)    Eosinophils Relative 4  0 - 5 (%)    Eosinophils Absolute 0.4  0.0 - 0.7 (K/uL)    Basophils Relative 0  0 - 1 (%)    Basophils Absolute 0.0  0.0 - 0.1 (K/uL)   COMPREHENSIVE METABOLIC PANEL     Status: Abnormal   Collection Time  11/17/11  3:00 PM      Component Value Range Comment   Sodium 136  135 - 145 (mEq/L)    Potassium 4.6  3.5 - 5.1 (mEq/L)    Chloride 102  96 - 112 (mEq/L)    CO2 28  19 - 32 (mEq/L)    Glucose, Bld 108 (*) 70 - 99 (mg/dL)    BUN 10  6 - 23 (mg/dL)    Creatinine, Ser 4.69  0.50 - 1.10 (mg/dL)    Calcium 8.7  8.4 - 10.5 (mg/dL)    Total Protein 7.1  6.0 - 8.3 (g/dL)    Albumin 3.4 (*) 3.5 - 5.2 (g/dL)    AST 29  0 - 37 (U/L) SLIGHT HEMOLYSIS   ALT 32  0 - 35 (U/L)    Alkaline Phosphatase 68  39 - 117 (U/L)    Total Bilirubin 0.2 (*) 0.3 - 1.2 (mg/dL)    GFR calc non Af Amer >90  >90 (mL/min)    GFR calc Af Amer >90  >90 (mL/min)   LIPASE, BLOOD     Status: Normal   Collection Time   11/17/11  3:00 PM      Component Value Range Comment   Lipase 17  11 - 59 (U/L)   LACTIC ACID, PLASMA     Status: Normal   Collection Time   11/17/11  5:04 PM      Component Value Range Comment   Lactic Acid, Venous 0.6  0.5 - 2.2 (mmol/L)     Radiological Exams on Admission: Ct Angio Chest W/cm &/or Wo Cm  11/17/2011  *RADIOLOGY REPORT*  Clinical Data: Shortness of breath.  Emesis.  CT ANGIOGRAPHY CHEST  Technique:  Multidetector CT imaging of the chest using the standard protocol during bolus administration of intravenous contrast. Multiplanar reconstructed images including MIPs were obtained and reviewed to evaluate the vascular anatomy.  Contrast: OMNIPAQUE IOHEXOL 300 MG/ML IJ SOLN  Comparison: Chest CT 06/07/2010  Findings:  Mediastinum: No filling defects  within the pulmonary arterial tree to suggest underlying pulmonary embolism. Heart size is mildly enlarged.  Numerous borderline enlarged and mildly enlarged mediastinal and hilar lymph nodes are noted, the largest of which is in the low right paratracheal station measuring 1.2 cm in short axis.  There is a small hiatal hernia. No acute abnormality of the thoracic aorta; specifically, no aneurysm or dissection.  Lungs/Pleura: Mild diffuse bronchial wall thickening (right lung greater than left).  Throughout the right lung there are patchy areas of peribronchovascular ground-glass attenuation.  Left lung appears relatively clear.  Minimal dependent bibasilar subsegmental atelectasis.  No definite suspicious appearing pulmonary nodules or masses are identified.  Upper Abdomen: Diffusely decreased attenuation throughout the hepatic parenchyma, consistent with hepatic steatosis.  Otherwise unremarkable.  Musculoskeletal: There are no aggressive appearing lytic or blastic lesions noted in the visualized portions of the skeleton.  IMPRESSION: 1.  No evidence of pulmonary embolism. 2.  Mild diffuse bronchial wall thickening (right lung greater than left), with patchy areas of peribronchovascular ground-glass attenuation in the right lung, likely reflecting an acute multilobar pneumonia. The numerous borderline enlarged and minimally enlarged mediastinal and hilar lymph nodes are presumably reactive. 3.  Mild cardiomegaly. 4.  Hepatic steatosis. 5.  Small hiatal hernia.  Original Report Authenticated By: Florencia Reasons, M.D.     Assessment/Plan 1-PNA (pneumonia): Comminuted for pneumonia with hypoxia on arrival to ED. Will admit the patient for IV antibiotics, provide oxygen supplementation via nasal cannula;  when necessary albuterol for wheezing/shortness of breath. IVF's resuscitation and supportive care.  2-Bipolar 1 disorder: Stable. Continue Celexa and Xanax.  3-GERD (gastroesophageal reflux disease):  Continue PPI.  4-DM (diabetes mellitus): Will check hemoglobin A1c; start patient on sliding scale insulin and will also continue him on decreased dose of 70/30 (patient insulin); since she is eating less due to N/V.  5-Nausea & vomiting: Most likely secondary to problem #1. Will provide fluid resuscitation since she is mildly dehydrated and will use antiemetics as needed.  6-Hypothyroidism: Will check TSH and continue Synthroid.  7-Chronic back pain: continue PRN narcotics and also lidocaine patches.  8-SOB (shortness of breath): due to CAP as mentioned above. Will provide supplemental oxygen and follow her symptoms.   9-DVT:Lovenox.   Time Spent on Admission: 60 minutes.  Tramaine Sauls [facility] [phone]  11/17/2011, 6:57 PM

## 2011-11-17 NOTE — ED Provider Notes (Signed)
History     CSN: [id number]  Arrival date & time 11/17/11  1321   First MD Initiated Contact with Patient 11/17/11 1503      Chief Complaint  Patient presents with  . Shortness of Breath  . Headache  . Nausea  . Emesis    (Consider location/radiation/quality/duration/timing/severity/associated sxs/prior treatment) HPI  56yoF pw shortness of breath, abdominal pain x 3-4 days. Patient c/o Right sided upper and lower abdominal pain now 4/10. Rt chest pain as well. Constant. Multiple episodes of NBNB emesis and now unable to tolerate PO. Was seen at OSH yesterday and had CT AP with revealed constipation, no acute changes. Incidentally noted to have ground glass opacities in R lower lobe. Contaminated urine sample. She was started on levaquin and has been unable to tolerate. Pt c/o worsening shortness of breath since yesterday. Denies fever. +Chills. +cough. fmhx VTE. No recent hosp/surg/immob. No h/o cancer. Denies exogenous hormone use, no leg pain or swelling. Pt also c/o mild frontal headache. Denies neck stiffness.   ED Notes, ED Provider Notes from 11/17/11 0000 to 11/17/11 14:16:01       Alvina Chou, RN 11/17/2011 14:14      Pt from home with reports of shortness of breath, N/V, headache and lower abdominal pain that started on Thursday. Pt O2 sat upon arrival was 88-90% on room air placed on 2L via nasal cannula, lips slightly cyanotic. Pt treated at [facility] ED last night with reports of being diagnosed with pneumonia, bladder infection and an intestinal blockage     Past Medical History  Diagnosis Date  . Thyroid disease   . Diabetes mellitus   . Depression   . Schizophrenia   . Bipolar affective   . Suicidal ideation   . Anxiety     History reviewed. No pertinent past surgical history.  History reviewed. No pertinent family history.  History  Substance Use Topics  . Smoking status: Never Smoker   . Smokeless tobacco: Never Used  . Alcohol Use: No    OB  History    Grav Para Term Preterm Abortions TAB SAB Ect Mult Living                  Review of Systems  All other systems reviewed and are negative.   except as noted HPI   Allergies  Sulfa antibiotics  Home Medications   Current Outpatient Rx  Name Route Sig Dispense Refill  . ACETAMINOPHEN 500 MG PO TABS Oral Take 1,000 mg by mouth every 6 (six) hours as needed. For pain    . ALBUTEROL SULFATE HFA 108 (90 BASE) MCG/ACT IN AERS Inhalation Inhale 2 puffs into the lungs every 6 (six) hours as needed. For shortness of breath    . ALPRAZOLAM 0.5 MG PO TABS Oral Take 0.5 mg by mouth 3 (three) times daily as needed. For anxiety    . CITALOPRAM HYDROBROMIDE 20 MG PO TABS Oral Take 20 mg by mouth daily.      Marland Kitchen CITALOPRAM HYDROBROMIDE 40 MG PO TABS Oral Take 40 mg by mouth daily.      Marland Kitchen DM-PHENYLEPHRINE-ACETAMINOPHEN 10-5-325 MG PO CAPS Oral Take 2 capsules by mouth every 6 (six) hours as needed. For cold and flu    . DOCUSATE SODIUM 100 MG PO CAPS Oral Take 100 mg by mouth 2 (two) times daily.      Marland Kitchen GABAPENTIN 600 MG PO TABS Oral Take 600 mg by mouth 4 (four) times daily.     Marland Kitchen  HYDROCODONE-ACETAMINOPHEN 5-500 MG PO TABS Oral Take 1 tablet by mouth 3 (three) times daily as needed. For pain    . INSULIN ISOPHANE & REGULAR (70-30) 100 UNIT/ML [place] SUSP Subcutaneous Inject 20-26 Units into the skin See admin instructions. Take 26 units at 7am and 20 units and 7pm    . LEVOTHYROXINE SODIUM 125 MCG PO TABS Oral Take 125 mcg by mouth daily.      Marland Kitchen LIDOCAINE 5 % EX PTCH Transdermal Place 1 patch onto the skin daily. Remove & Discard patch within 12 hours or as directed by MD    . LORATADINE 10 MG PO TABS Oral Take 10 mg by mouth daily.      Marland Kitchen METFORMIN HCL 500 MG PO TABS Oral Take 500 mg by mouth 2 (two) times daily with a meal.      . OLANZAPINE 10 MG PO TABS Oral Take 10 mg by mouth at bedtime.      Marland Kitchen PROMETHAZINE HCL 25 MG PO TABS Oral Take 25 mg by mouth every 6 (six) hours as needed. For  nausea    . TRAZODONE HCL 100 MG PO TABS Oral Take 100 mg by mouth at bedtime.        BP 127/75  Temp(Src) 98.9 F (37.2 C) (Oral)  Resp 16  Ht 5\' 7"  (1.702 m)  Wt 232 lb (105.235 kg)  BMI 36.34 kg/m2  SpO2 95%  Physical Exam  Nursing note and vitals reviewed. Constitutional: She is oriented to person, place, and time. She appears well-developed.  HENT:  Head: Atraumatic.       Mm dry  Eyes: Conjunctivae and EOM are normal. Pupils are equal, round, and reactive to light.  Neck: Normal range of motion. Neck supple.  Cardiovascular: Normal rate, regular rhythm, normal heart sounds and intact distal pulses.   Pulmonary/Chest: Effort normal and breath sounds normal. No respiratory distress. She has no wheezes. She has no rales.       2L [place] in place R lower lung fields with crackles  Abdominal: Soft. She exhibits no distension. There is tenderness. There is no rebound and no guarding.       Min diffuse rt abd ttp  Musculoskeletal: Normal range of motion. She exhibits no edema and no tenderness.  Neurological: She is alert and oriented to person, place, and time.  Skin: Skin is warm and dry. No rash noted.  Psychiatric: She has a normal mood and affect.    ED Course  Procedures (including critical care time)  Labs Reviewed  GLUCOSE, CAPILLARY - Abnormal; Notable for the following:    Glucose-Capillary 119 (*)    All other components within normal limits  CBC - Abnormal; Notable for the following:    Hemoglobin 11.5 (*)    HCT 35.5 (*)    All other components within normal limits  COMPREHENSIVE METABOLIC PANEL - Abnormal; Notable for the following:    Glucose, Bld 108 (*)    Albumin 3.4 (*)    Total Bilirubin 0.2 (*)    All other components within normal limits  DIFFERENTIAL  LIPASE, BLOOD  LACTIC ACID, PLASMA  URINALYSIS, ROUTINE W REFLEX MICROSCOPIC  CULTURE, BLOOD (ROUTINE X 2)  CULTURE, BLOOD (ROUTINE X 2)   Ct Angio Chest W/cm &/or Wo Cm  11/17/2011  *RADIOLOGY  REPORT*  Clinical Data: Shortness of breath.  Emesis.  CT ANGIOGRAPHY CHEST  Technique:  Multidetector CT imaging of the chest using the standard protocol during bolus administration of intravenous contrast.  Multiplanar reconstructed images including MIPs were obtained and reviewed to evaluate the vascular anatomy.  Contrast: OMNIPAQUE IOHEXOL 300 MG/ML IJ SOLN  Comparison: Chest CT 06/07/2010  Findings:  Mediastinum: No filling defects within the pulmonary arterial tree to suggest underlying pulmonary embolism. Heart size is mildly enlarged.  Numerous borderline enlarged and mildly enlarged mediastinal and hilar lymph nodes are noted, the largest of which is in the low right paratracheal station measuring 1.2 cm in short axis.  There is a small hiatal hernia. No acute abnormality of the thoracic aorta; specifically, no aneurysm or dissection.  Lungs/Pleura: Mild diffuse bronchial wall thickening (right lung greater than left).  Throughout the right lung there are patchy areas of peribronchovascular ground-glass attenuation.  Left lung appears relatively clear.  Minimal dependent bibasilar subsegmental atelectasis.  No definite suspicious appearing pulmonary nodules or masses are identified.  Upper Abdomen: Diffusely decreased attenuation throughout the hepatic parenchyma, consistent with hepatic steatosis.  Otherwise unremarkable.  Musculoskeletal: There are no aggressive appearing lytic or blastic lesions noted in the visualized portions of the skeleton.  IMPRESSION: 1.  No evidence of pulmonary embolism. 2.  Mild diffuse bronchial wall thickening (right lung greater than left), with patchy areas of peribronchovascular ground-glass attenuation in the right lung, likely reflecting an acute multilobar pneumonia. The numerous borderline enlarged and minimally enlarged mediastinal and hilar lymph nodes are presumably reactive. 3.  Mild cardiomegaly. 4.  Hepatic steatosis. 5.  Small hiatal hernia.  Original  Report Authenticated By: Florencia Reasons, M.D.     1. CAP (community acquired pneumonia)   2. Headache   3. Shortness of breath   Dehydration    MDM  PW multiple complaints, found to have multilobar CAP. No known h/o immunocompromise. Hypoxic on rA 80s in ED. Unable to tolerate outpatient antibiotics. Ceftriaxone, azithromycin. Discussed admission with [facility].        Forbes Cellar, MD 11/19/11 1428

## 2011-11-17 NOTE — ED Notes (Signed)
Pt from home with reports of shortness of breath, N/V, headache and lower abdominal pain that started on Thursday. Pt O2 sat upon arrival was 88-90% on room air placed on 2L via nasal cannula, lips slightly cyanotic. Pt treated at [facility] ED last night with reports of being diagnosed with pneumonia, bladder infection and an intestinal blockage.

## 2011-11-17 NOTE — ED Notes (Signed)
[id number]<IO> Expected date:<BR> Expected time:<BR> Means of arrival:<BR> Comments:<BR> Hold for triage 2.

## 2011-11-18 LAB — BASIC METABOLIC PANEL
Calcium: 8.1 mg/dL — ABNORMAL LOW (ref 8.4–10.5)
Creatinine, Ser: 0.95 mg/dL (ref 0.50–1.10)
GFR calc Af Amer: 75 mL/min — ABNORMAL LOW (ref >90)
Sodium: 135 mEq/L (ref 135–145)

## 2011-11-18 LAB — LIPID PANEL
Cholesterol: 150 mg/dL (ref 0–200)
HDL: 30 mg/dL — ABNORMAL LOW (ref >39)
Triglycerides: 169 mg/dL — ABNORMAL HIGH (ref <150)

## 2011-11-18 LAB — CBC
Platelets: 233 10*3/uL (ref 150–400)
RBC: 3.83 MIL/uL — ABNORMAL LOW (ref 3.87–5.11)
RDW: 13.1 % (ref 11.5–15.5)
WBC: 7.6 10*3/uL (ref 4.0–10.5)

## 2011-11-18 LAB — HEMOGLOBIN A1C
Hgb A1c MFr Bld: 6.9 % — ABNORMAL HIGH (ref <5.7)
Mean Plasma Glucose: 151 mg/dL — ABNORMAL HIGH (ref <117)

## 2011-11-18 LAB — GLUCOSE, CAPILLARY: Glucose-Capillary: 109 mg/dL — ABNORMAL HIGH (ref 70–99)

## 2011-11-18 MED ORDER — PNEUMOCOCCAL VAC POLYVALENT 25 MCG/0.5ML IJ INJ
0.5000 mL | INJECTION | INTRAMUSCULAR | Status: AC
  Administered 2011-11-19: 0.5 mL via INTRAMUSCULAR
  Filled 2011-11-18: qty 0.5

## 2011-11-18 MED ORDER — LACTULOSE 10 GM/15ML PO SOLN
20.0000 g | Freq: Two times a day (BID) | ORAL | Status: DC
  Administered 2011-11-18 – 2011-11-23 (×9): 20 g via ORAL
  Filled 2011-11-18 (×14): qty 30

## 2011-11-18 MED ORDER — SODIUM CHLORIDE 0.9 % IV SOLN
INTRAVENOUS | Status: DC
  Administered 2011-11-18 – 2011-11-21 (×3): via INTRAVENOUS

## 2011-11-18 MED ORDER — GABAPENTIN 300 MG PO CAPS
600.0000 mg | ORAL_CAPSULE | Freq: Four times a day (QID) | ORAL | Status: DC
  Administered 2011-11-18 – 2011-11-24 (×25): 600 mg via ORAL
  Filled 2011-11-18 (×28): qty 2

## 2011-11-18 NOTE — Progress Notes (Signed)
Subjective: Feeling a little better; but still with SOB (especially w/o oxygen); also with some Nausea. Patient with constipation. Has not tolerate much PO intake yet.  Objective: Vital signs in last 24 hours: Temp:  [97.5 F (36.4 C)-98.9 F (37.2 C)] 98 F (36.7 C) (03/10 0505) Pulse Rate:  [79-95] 95  (03/10 0505) Resp:  [16-18] 18  (03/10 0505) BP: (113-139)/(52-75) 139/58 mmHg (03/10 0505) SpO2:  [90 %-99 %] 93 % (03/10 0837) Weight:  [105.235 kg (232 lb)-106.505 kg (234 lb 12.8 oz)] 106.505 kg (234 lb 12.8 oz) (03/09 2115) Weight change:  Last BM Date:  (prior to admissiion)  Intake/Output from previous day:     Physical Exam: General: Alert, awake, oriented x3, in mild distress. HEENT: No bruits, no goiter. Heart: Regular rate and rhythm, without murmurs, rubs, gallops. Lungs: scattered rhonchi; improved air movement. Abdomen: Soft, nontender, nondistended, positive bowel sounds. Extremities: No clubbing cyanosis or edema with positive pedal pulses. Neuro: Grossly intact, nonfocal.   Lab Results: Basic Metabolic Panel:  Basename 11/18/11 0405 11/17/11 1500  NA 135 136  K 4.4 4.6  CL 102 102  CO2 30 28  GLUCOSE 124* 108*  BUN 8 10  CREATININE 0.95 0.79  CALCIUM 8.1* 8.7  MG -- --  PHOS -- --   Liver Function Tests:  Basename 11/17/11 1500  AST 29  ALT 32  ALKPHOS 68  BILITOT 0.2*  PROT 7.1  ALBUMIN 3.4*    Basename 11/17/11 1500  LIPASE 17  AMYLASE --   CBC:  Basename 11/18/11 0405 11/17/11 1500  WBC 7.6 9.0  NEUTROABS -- 4.7  HGB 10.9* 11.5*  HCT 34.7* 35.5*  MCV 90.6 89.0  PLT 233 279   CBG:  Basename 11/18/11 0653 11/17/11 2227 11/17/11 1409  GLUCAP 143* 158* 119*   Hemoglobin A1C:  Basename 11/17/11 2220  HGBA1C 6.9*   Fasting Lipid Panel:  Basename 11/18/11 0405  CHOL 150  HDL 30*  LDLCALC 86  TRIG 161*  CHOLHDL 5.0  LDLDIRECT --   Thyroid Function Tests:  Basename 11/17/11 2220  TSH 6.967*  T4TOTAL --  FREET4  --  T3FREE --  THYROIDAB --   Urine Drug Screen: Drugs of Abuse     Component Value Date/Time   LABOPIA NONE DETECTED 08/30/2011 1300   LABOPIA NEGATIVE 10/14/2007 2019   COCAINSCRNUR NONE DETECTED 08/30/2011 1300   COCAINSCRNUR NEGATIVE 10/14/2007 2019   LABBENZ NONE DETECTED 08/30/2011 1300   LABBENZ NEGATIVE 10/14/2007 2019   AMPHETMU NONE DETECTED 08/30/2011 1300   AMPHETMU NEGATIVE 10/14/2007 2019   THCU NONE DETECTED 08/30/2011 1300   LABBARB NONE DETECTED 08/30/2011 1300    Urinalysis:  Basename 11/17/11 1735  COLORURINE YELLOW  LABSPEC 1.018  PHURINE 6.0  GLUCOSEU NEGATIVE  HGBUR NEGATIVE  BILIRUBINUR NEGATIVE  KETONESUR NEGATIVE  PROTEINUR NEGATIVE  UROBILINOGEN 0.2  NITRITE NEGATIVE  LEUKOCYTESUR SMALL*   Misc. Labs:  Recent Results (from the past 240 hour(s))  CULTURE, BLOOD (ROUTINE X 2)     Status: Normal (Preliminary result)   Collection Time   11/17/11  6:00 PM      Component Value Range Status Comment   Specimen Description BLOOD LEFT ARM   Final    Special Requests BOTTLES DRAWN AEROBIC AND ANAEROBIC 5CC   Final    Culture  Setup Time [id number]   Final    Culture     Final    Value:        BLOOD CULTURE RECEIVED NO GROWTH  TO DATE CULTURE WILL BE HELD FOR 5 DAYS BEFORE ISSUING A FINAL NEGATIVE REPORT   Report Status PENDING   Incomplete   CULTURE, BLOOD (ROUTINE X 2)     Status: Normal (Preliminary result)   Collection Time   11/17/11  6:10 PM      Component Value Range Status Comment   Specimen Description BLOOD LEFT HAND   Final    Special Requests BOTTLES DRAWN AEROBIC AND ANAEROBIC 5CC   Final    Culture  Setup Time [id number]   Final    Culture     Final    Value:        BLOOD CULTURE RECEIVED NO GROWTH TO DATE CULTURE WILL BE HELD FOR 5 DAYS BEFORE ISSUING A FINAL NEGATIVE REPORT   Report Status PENDING   Incomplete     Studies/Results: Ct Angio Chest W/cm &/or Wo Cm  11/17/2011  *RADIOLOGY REPORT*  Clinical Data: Shortness of breath.   Emesis.  CT ANGIOGRAPHY CHEST  Technique:  Multidetector CT imaging of the chest using the standard protocol during bolus administration of intravenous contrast. Multiplanar reconstructed images including MIPs were obtained and reviewed to evaluate the vascular anatomy.  Contrast: OMNIPAQUE IOHEXOL 300 MG/ML IJ SOLN  Comparison: Chest CT 06/07/2010  Findings:  Mediastinum: No filling defects within the pulmonary arterial tree to suggest underlying pulmonary embolism. Heart size is mildly enlarged.  Numerous borderline enlarged and mildly enlarged mediastinal and hilar lymph nodes are noted, the largest of which is in the low right paratracheal station measuring 1.2 cm in short axis.  There is a small hiatal hernia. No acute abnormality of the thoracic aorta; specifically, no aneurysm or dissection.  Lungs/Pleura: Mild diffuse bronchial wall thickening (right lung greater than left).  Throughout the right lung there are patchy areas of peribronchovascular ground-glass attenuation.  Left lung appears relatively clear.  Minimal dependent bibasilar subsegmental atelectasis.  No definite suspicious appearing pulmonary nodules or masses are identified.  Upper Abdomen: Diffusely decreased attenuation throughout the hepatic parenchyma, consistent with hepatic steatosis.  Otherwise unremarkable.  Musculoskeletal: There are no aggressive appearing lytic or blastic lesions noted in the visualized portions of the skeleton.  IMPRESSION: 1.  No evidence of pulmonary embolism. 2.  Mild diffuse bronchial wall thickening (right lung greater than left), with patchy areas of peribronchovascular ground-glass attenuation in the right lung, likely reflecting an acute multilobar pneumonia. The numerous borderline enlarged and minimally enlarged mediastinal and hilar lymph nodes are presumably reactive. 3.  Mild cardiomegaly. 4.  Hepatic steatosis. 5.  Small hiatal hernia.  Original Report Authenticated By: Florencia Reasons, M.D.      Medications: Scheduled Meds:   . ipratropium  0.5 mg Nebulization Q4H   And  . albuterol  2.5 mg Nebulization Q4H  . azithromycin  500 mg Intravenous Q24H  . cefTRIAXone (ROCEPHIN)  IV  1 g Intravenous Once  . cefTRIAXone (ROCEPHIN)  IV  1 g Intravenous Q24H  . citalopram  20 mg Oral QAC breakfast  . citalopram  40 mg Oral QAC lunch  . docusate sodium  100 mg Oral BID  . enoxaparin  40 mg Subcutaneous Q24H  . gabapentin  600 mg Oral QID  . insulin aspart  0-15 Units Subcutaneous TID WC  . insulin aspart protamine-insulin aspart  15 Units Subcutaneous BID WC  . lactulose  20 g Oral BID  . levothyroxine  125 mcg Oral Daily  . lidocaine  1 patch Transdermal Q24H  . loratadine  10 mg Oral Daily  .  morphine injection  4 mg Intravenous Once  .  morphine injection  4 mg Intravenous Once  . OLANZapine  10 mg Oral QHS  . ondansetron (ZOFRAN) IV  4 mg Intravenous Once  . ondansetron (ZOFRAN) IV  4 mg Intravenous Once  . pantoprazole  40 mg Oral Q1200  . sodium chloride  1,000 mL Intravenous Once  . traZODone  100 mg Oral QHS  . DISCONTD: azithromycin (ZITHROMAX) 500 MG IVPB  500 mg Intravenous Once  . DISCONTD: citalopram  20 mg Oral Daily  . DISCONTD: gabapentin  600 mg Oral QID  . DISCONTD: sodium chloride  1,000 mL Intravenous Once   Continuous Infusions:   . sodium chloride 1,000 mL (11/17/11 2325)  . sodium chloride     PRN Meds:.acetaminophen, acetaminophen, ALPRAZolam, HYDROcodone-acetaminophen, iohexol, morphine, ondansetron (ZOFRAN) IV, ondansetron  Assessment/Plan: 1-PNA (pneumonia): Community adquired pneumonia with hypoxia on arrival to ED. Continue supplemental oxygen; continue IV antibiotics and supportive care. Still having difficulty with PO intake; which make dificult to start PO antibiotics.  2-Bipolar 1 disorder: Stable. Continue Celexa and Xanax.   3-GERD (gastroesophageal reflux disease): Continue PPI.   4-DM (diabetes mellitus): Fair control; A1C 6.9.  While in the hospital will continue SSI and 70/30. 5-Nausea & vomiting: Most likely secondary to problem #1. Continue IVF's and PRN antiemetics. Patient also reporting of multiple days w/o BM's; will add lactulose to current bowel regimen.   6-Hypothyroidism: continue Synthroid.   7-Chronic back pain: continue PRN narcotics and also lidocaine patches.   8-SOB (shortness of breath): Improved; continue supplemental oxygen and follow her symptoms.   9-constipation: start lactulose as mentioned above. If no BM by tomorrow will need to order enemas. Per patient and family members is a chronic problem and last BM was almost a week ago.  10-DVT:Lovenox.    LOS: 1 day   Marcile Fuquay [facility] [phone]  11/18/2011, 11:49 AM

## 2011-11-19 LAB — GLUCOSE, CAPILLARY
Glucose-Capillary: 114 mg/dL — ABNORMAL HIGH (ref 70–99)
Glucose-Capillary: 196 mg/dL — ABNORMAL HIGH (ref 70–99)

## 2011-11-19 MED ORDER — MORPHINE SULFATE 2 MG/ML IJ SOLN
1.0000 mg | Freq: Four times a day (QID) | INTRAMUSCULAR | Status: DC | PRN
  Administered 2011-11-20 – 2011-11-23 (×6): 1 mg via INTRAVENOUS
  Filled 2011-11-19 (×7): qty 1

## 2011-11-19 MED ORDER — METOCLOPRAMIDE HCL 5 MG/ML IJ SOLN
5.0000 mg | Freq: Four times a day (QID) | INTRAMUSCULAR | Status: AC
  Administered 2011-11-19 (×3): 5 mg via INTRAVENOUS
  Administered 2011-11-20 (×2): via INTRAVENOUS
  Administered 2011-11-20: 5 mg via INTRAVENOUS
  Filled 2011-11-19 (×8): qty 1

## 2011-11-19 MED ORDER — HYDROCODONE-ACETAMINOPHEN 5-325 MG PO TABS
1.0000 | ORAL_TABLET | Freq: Four times a day (QID) | ORAL | Status: DC | PRN
  Administered 2011-11-20 – 2011-11-24 (×7): 1 via ORAL
  Filled 2011-11-19 (×8): qty 1

## 2011-11-19 NOTE — Progress Notes (Signed)
UR CHART REVIEWED; B Amaranta Mehl RN, BSN, MHA 

## 2011-11-19 NOTE — Consult Note (Signed)
[facility] Consultation Note  Referring Provider:  Marcellus Scott, MD [facility])  Reason for Consultation:  Nausea, vomiting  HPI: Cheryl [name] is a 56 y.o. female with chronic low back pain on chronic narcotics, new onset diabetes, presents for evaluation of shortness-of-breath and nausea + vomiting.  For the past 8 months, patient has had escalating constipation, with bowel frequency as infrequent as every one month reported.  For the past 3 months, she has also had daily, usually in the morning, nausea and vomiting (with symptom regression later in the day).  For the past one week, she has had escalating nausea and vomiting, throughout the day, sometimes bilious, sometimes foodstuffs consumed the previous day.  Daughter has had stomach flu recently. Soon after her increase in nausea/vomiting, she began having progressive shortness of breath.  Seen at [facility] ED, sent home; presented then to [facility] ED, had CT showing likely multilobar pneumonia, and was admitted.  She has never had an endoscopy or colonoscopy.  There is no known family history of colon cancer or polyps.   Past Medical History  Diagnosis Date  . Thyroid disease   . Diabetes mellitus   . Depression   . Schizophrenia   . Bipolar affective   . Suicidal ideation   . Anxiety     History reviewed. No pertinent past surgical history.  Prior to Admission medications   Medication Sig Start Date End Date Taking? Authorizing Provider  acetaminophen (TYLENOL) 500 MG tablet Take 1,000 mg by mouth every 6 (six) hours as needed. For pain   Yes Historical Provider, MD  albuterol (PROVENTIL HFA;VENTOLIN HFA) 108 (90 BASE) MCG/ACT inhaler Inhale 2 puffs into the lungs every 6 (six) hours as needed. For shortness of breath   Yes Historical Provider, MD  ALPRAZolam (XANAX) 0.5 MG tablet Take 0.5 mg by mouth 3 (three) times daily as needed. For anxiety   Yes Historical Provider, MD  citalopram (CELEXA) 20 MG tablet Take 20 mg  by mouth daily.     Yes Historical Provider, MD  citalopram (CELEXA) 40 MG tablet Take 40 mg by mouth daily.     Yes Historical Provider, MD  DM-Phenylephrine-Acetaminophen (ALKA-SELTZER PLUS DAY COLD/FLU) 10-5-325 MG CAPS Take 2 capsules by mouth every 6 (six) hours as needed. For cold and flu   Yes Historical Provider, MD  docusate sodium (COLACE) 100 MG capsule Take 100 mg by mouth 2 (two) times daily.     Yes Historical Provider, MD  gabapentin (NEURONTIN) 600 MG tablet Take 600 mg by mouth 4 (four) times daily.    Yes Historical Provider, MD  HYDROcodone-acetaminophen (VICODIN) 5-500 MG per tablet Take 1 tablet by mouth 3 (three) times daily as needed. For pain   Yes Historical Provider, MD  insulin NPH-insulin regular (NOVOLIN 70/30) (70-30) 100 UNIT/ML injection Inject 20-26 Units into the skin See admin instructions. Take 26 units at 7am and 20 units and 7pm   Yes Historical Provider, MD  levothyroxine (SYNTHROID, LEVOTHROID) 125 MCG tablet Take 125 mcg by mouth daily.     Yes Historical Provider, MD  lidocaine (LIDODERM) 5 % Place 1 patch onto the skin daily. Remove & Discard patch within 12 hours or as directed by MD   Yes Historical Provider, MD  loratadine (CLARITIN) 10 MG tablet Take 10 mg by mouth daily.     Yes Historical Provider, MD  metFORMIN (GLUCOPHAGE) 500 MG tablet Take 500 mg by mouth 2 (two) times daily with a meal.  Yes Historical Provider, MD  OLANZapine (ZYPREXA) 10 MG tablet Take 10 mg by mouth at bedtime.     Yes Historical Provider, MD  promethazine (PHENERGAN) 25 MG tablet Take 25 mg by mouth every 6 (six) hours as needed. For nausea   Yes Historical Provider, MD  traZODone (DESYREL) 100 MG tablet Take 100 mg by mouth at bedtime.     Yes Historical Provider, MD    Current Facility-Administered Medications  Medication Dose Route Frequency Provider Last Rate Last Dose  . 0.9 %  sodium chloride infusion   Intravenous Continuous Vassie Loll, MD 50 mL/hr at 11/18/11  1417    . acetaminophen (TYLENOL) tablet 650 mg  650 mg Oral Q6H PRN Vassie Loll, MD   650 mg at 11/18/11 1132   Or  . acetaminophen (TYLENOL) suppository 650 mg  650 mg Rectal Q6H PRN Vassie Loll, MD      . ipratropium (ATROVENT) nebulizer solution 0.5 mg  0.5 mg Nebulization Q4H Vassie Loll, MD   0.5 mg at 11/19/11 1032   And  . albuterol (PROVENTIL) (5 MG/ML) 0.5% nebulizer solution 2.5 mg  2.5 mg Nebulization Q4H Vassie Loll, MD   2.5 mg at 11/19/11 1032  . ALPRAZolam Prudy Feeler) tablet 0.5 mg  0.5 mg Oral TID PRN Vassie Loll, MD   0.5 mg at 11/17/11 2332  . azithromycin (ZITHROMAX) 500 mg in dextrose 5 % 250 mL IVPB  500 mg Intravenous Q24H Vassie Loll, MD   500 mg at 11/18/11 2311  . cefTRIAXone (ROCEPHIN) 1 g in dextrose 5 % 50 mL IVPB  1 g Intravenous Q24H Vassie Loll, MD   1 g at 11/19/11 0149  . citalopram (CELEXA) tablet 20 mg  20 mg Oral QAC breakfast Vassie Loll, MD   20 mg at 11/18/11 1010  . citalopram (CELEXA) tablet 40 mg  40 mg Oral QAC lunch Vassie Loll, MD   40 mg at 11/18/11 1306  . docusate sodium (COLACE) capsule 100 mg  100 mg Oral BID Vassie Loll, MD   100 mg at 11/19/11 1015  . enoxaparin (LOVENOX) injection 40 mg  40 mg Subcutaneous Q24H Vassie Loll, MD   40 mg at 11/18/11 2039  . gabapentin (NEURONTIN) capsule 600 mg  600 mg Oral QID Henderson Cloud, MD   600 mg at 11/19/11 1016  . HYDROcodone-acetaminophen (NORCO) 5-325 MG per tablet 1-2 tablet  1-2 tablet Oral Q6H PRN Vassie Loll, MD   2 tablet at 11/19/11 [phone]  . insulin aspart (novoLOG) injection 0-15 Units  0-15 Units Subcutaneous TID WC Vassie Loll, MD   2 Units at 11/19/11 [phone]  . insulin aspart protamine-insulin aspart (NOVOLOG 70/30) injection 15 Units  15 Units Subcutaneous BID WC Vassie Loll, MD   15 Units at 11/19/11 [phone]  . lactulose (CHRONULAC) 10 GM/15ML solution 20 g  20 g Oral BID Vassie Loll, MD   20 g at 11/19/11 1017  . levothyroxine (SYNTHROID, LEVOTHROID) tablet  125 mcg  125 mcg Oral Daily Vassie Loll, MD   125 mcg at 11/19/11 1016  . lidocaine (LIDODERM) 5 % 1 patch  1 patch Transdermal Q24H Vassie Loll, MD   1 patch at 11/18/11 2040  . loratadine (CLARITIN) tablet 10 mg  10 mg Oral Daily Vassie Loll, MD   10 mg at 11/19/11 1204  . morphine 2 MG/ML injection 1 mg  1 mg Intravenous Q4H PRN Vassie Loll, MD   1 mg at 11/19/11 1024  . OLANZapine (ZYPREXA)  tablet 10 mg  10 mg Oral QHS Vassie Loll, MD   10 mg at 11/18/11 2312  . ondansetron (ZOFRAN) tablet 4 mg  4 mg Oral Q6H PRN Vassie Loll, MD       Or  . ondansetron [facility]) injection 4 mg  4 mg Intravenous Q6H PRN Vassie Loll, MD      . pantoprazole (PROTONIX) EC tablet 40 mg  40 mg Oral Q1200 Vassie Loll, MD   40 mg at 11/19/11 1204  . pneumococcal 23 valent vaccine (PNU-IMMUNE) injection 0.5 mL  0.5 mL Intramuscular Tomorrow-1000 Henderson Cloud, MD   0.5 mL at 11/19/11 1054  . traZODone (DESYREL) tablet 100 mg  100 mg Oral QHS Vassie Loll, MD   100 mg at 11/18/11 2312    Allergies as of 11/17/2011 - Review Complete 11/17/2011  Allergen Reaction Noted  . Sulfa antibiotics Rash 08/30/2011    History reviewed. No pertinent family history.  History   Social History  . Marital Status: Divorced    Spouse Name: N/A    Number of Children: N/A  . Years of Education: N/A   Occupational History  . Not on file.   Social History Main Topics  . Smoking status: Never Smoker   . Smokeless tobacco: Never Used  . Alcohol Use: No  . Drug Use: No  . Sexually Active:    Other Topics Concern  . Not on file   Social History Narrative  . No narrative on file    Review of Systems: Positive for: fever, chills, rigors, night sweats, anorexia, fatigue, weakness, malaise,  dyspnea, cough, sputum, new onset diabetes Gen: Denies any , involuntary weight loss, and sleep disorder CV: Denies chest pain, angina, palpitations, syncope, orthopnea, PND, peripheral edema, and  claudication. Resp: Denies, wheezing, coughing up blood. GI: Described in detail in HPI.    GU : Denies urinary burning, blood in urine, urinary frequency, urinary hesitancy, nocturnal urination, and urinary incontinence. MS: Denies joint pain or swelling.  Denies muscle weakness, cramps, atrophy.  Derm: Denies rash, itching, oral ulcerations, hives, unhealing ulcers.  Psych: Denies depression, anxiety, memory loss, suicidal ideation, hallucinations,  and confusion. Heme: Denies bruising, bleeding, and enlarged lymph nodes. Neuro:  Denies any headaches, dizziness, paresthesias. Endo:  Denies any problems with, thyroid, adrenal function.  Physical Exam: Vital signs in last 24 hours: Temp:  [98.3 F (36.8 C)-99 F (37.2 C)] 99 F (37.2 C) (03/11 0531) Pulse Rate:  [98-111] 111  (03/11 0531) Resp:  [18-22] 22  (03/11 0531) BP: (121-140)/(60-62) 121/60 mmHg (03/11 0531) SpO2:  [90 %-95 %] 95 % (03/11 1033) Last BM Date:  (PTA) General:   Somnolent but arousable; overweight;  A bit tachypneic-appearing Head:  Normocephalic and atraumatic. Eyes:  Sclera clear, no icterus.   Conjunctiva pink. Ears:  Normal auditory acuity. Nose:  No deformity, discharge,  or lesions. Mouth:  Dry mucous membranes Neck:  Thick but otherwise supple; no masses or thyromegaly. Lungs: poor breath sounds with bibasilar rales; No wheezes or rhonchi. Mild tachypnea Heart:  Regular rate and rhythm; no murmurs, clicks, rubs,  or gallops. Abdomen:  Soft, protuberant, mild lower abdominal tenderness, No masses, hepatosplenomegaly or hernias noted, but exam limited due to patient size. Normal bowel sounds, without guarding, and without rebound.     Msk:  Symmetrical without gross deformities. Normal posture. Pulses:  Normal pulses noted. Extremities:  Without clubbing or edema. Neurologic:  Alert and  oriented x4;  Diffusely weak, but seemingly otherwise  grossly normal neurologically. Skin:  Intact without significant  lesions or rashes. Psych:  Alert and cooperative. Normal mood and affect.   Lab Results:  Basename 11/18/11 0405 11/17/11 1500  WBC 7.6 9.0  HGB 10.9* 11.5*  HCT 34.7* 35.5*  PLT 233 279   BMET  Basename 11/18/11 0405 11/17/11 1500  NA 135 136  K 4.4 4.6  CL 102 102  CO2 30 28  GLUCOSE 124* 108*  BUN 8 10  CREATININE 0.95 0.79  CALCIUM 8.1* 8.7   LFT  Basename 11/17/11 1500  PROT 7.1  ALBUMIN 3.4*  AST 29  ALT 32  ALKPHOS 68  BILITOT 0.2*  BILIDIR --  IBILI --   PT/INR No results found for this basename: LABPROT:2,INR:2 in the last 72 hours  Studies/Results: Ct Angio Chest W/cm &/or Wo Cm  11/17/2011  *RADIOLOGY REPORT*  Clinical Data: Shortness of breath.  Emesis.  CT ANGIOGRAPHY CHEST  Technique:  Multidetector CT imaging of the chest using the standard protocol during bolus administration of intravenous contrast. Multiplanar reconstructed images including MIPs were obtained and reviewed to evaluate the vascular anatomy.  Contrast: OMNIPAQUE IOHEXOL 300 MG/ML IJ SOLN  Comparison: Chest CT 06/07/2010  Findings:  Mediastinum: No filling defects within the pulmonary arterial tree to suggest underlying pulmonary embolism. Heart size is mildly enlarged.  Numerous borderline enlarged and mildly enlarged mediastinal and hilar lymph nodes are noted, the largest of which is in the low right paratracheal station measuring 1.2 cm in short axis.  There is a small hiatal hernia. No acute abnormality of the thoracic aorta; specifically, no aneurysm or dissection.  Lungs/Pleura: Mild diffuse bronchial wall thickening (right lung greater than left).  Throughout the right lung there are patchy areas of peribronchovascular ground-glass attenuation.  Left lung appears relatively clear.  Minimal dependent bibasilar subsegmental atelectasis.  No definite suspicious appearing pulmonary nodules or masses are identified.  Upper Abdomen: Diffusely decreased attenuation throughout the hepatic  parenchyma, consistent with hepatic steatosis.  Otherwise unremarkable.  Musculoskeletal: There are no aggressive appearing lytic or blastic lesions noted in the visualized portions of the skeleton.  IMPRESSION: 1.  No evidence of pulmonary embolism. 2.  Mild diffuse bronchial wall thickening (right lung greater than left), with patchy areas of peribronchovascular ground-glass attenuation in the right lung, likely reflecting an acute multilobar pneumonia. The numerous borderline enlarged and minimally enlarged mediastinal and hilar lymph nodes are presumably reactive. 3.  Mild cardiomegaly. 4.  Hepatic steatosis. 5.  Small hiatal hernia.  Original Report Authenticated By: Florencia Reasons, M.D.   Impression:  1.  Shortness of breath with CT suggestive of multilobar pneumonia.  I suspect patient had at least some component of aspiration pneumonia. 2.  Nausea and vomiting.  Short-term, patient might have had acute viral gastroenteritis.  However, patient has long-term preceding nausea/vomiting, which I suspect are due to narcotic- and diabetes-related gastroparesis.  Ulcers, gallbladder process, etc, can't be definitively excluded at this time. 3.  Constipation.  Suspect narcotic-related.  May also have component of diabetes-related dysmotility of colon as well.  Plan:  1.  Sips clear liquids only. 2.  Minimize narcotics (as clinically feasible given her history of chronic low back pain). 3.  Add scheduled metoclopramide for 24-36 hours; continue prn use of ondansetron for breakthrough symptoms. 4.  Patient will likely ultimately benefit from endoscopy, but will defer for now until we see some significant improvement with her respiratory troubles. 5.  Will follow; I have discussed case with  Dr. Waymon Amato.   LOS: 2 days   Shade Kaley M  11/19/2011, 1:33 PM

## 2011-11-19 NOTE — Progress Notes (Signed)
Subjective:   Chart reviewed. Patient and daughter who is at the bedside are both providing history. According to them patient has had almost daily vomiting, mostly in the mornings x2 which consists of food that she has consumed the night before. She has chronic constipation with BM once every 2-3 weeks. She complains of pain across the lower abdomen. The symptoms seem to get worse 4 days prior to admission with intractable nausea and vomiting. [facility] was advised to seek outpatient consultation with gastroenterology and gynecology for ovarian cysts. However because patient was not getting better, her daughter brought her to [facility]. She was able to keep down some breakfast this morning. She has never had endoscopies. She was diagnosed with diabetes approximately 9 months ago. Patient's daughter also had nausea vomiting about the same time that her mom got worse with these symptoms. She complains of cough with intermittent yellowish green sputum. Denies dyspnea or chest pain.  Objective  Vital signs in last 24 hours: Filed Vitals:   11/19/11 0531 11/19/11 1033 11/19/11 1300 11/19/11 1506  BP: 121/60  120/66   Pulse: 111  78   Temp: 99 F (37.2 C)  97.6 F (36.4 C)   TempSrc: Oral     Resp: 22  20   Height:      Weight:      SpO2: 92% 95% 97% 97%   Weight change:   Intake/Output Summary (Last 24 hours) at 11/19/11 1836 Last data filed at 11/19/11 1300  Gross per 24 hour  Intake   3077 ml  Output      0 ml  Net   3077 ml    Physical Exam:  General Exam: ill looking but comfortable. No obvious distress. Morbidly obese. Respiratory System: Few basal crackles but otherwise clear to auscultation. No increased work of breathing.  Cardiovascular System: First and second heart sounds heard. Regular rate and rhythm. No JVD/murmurs.  Gastrointestinal System: Abdomen is non distended, soft and normal bowel sounds heard. Mild tenderness in  the lower quadrants but no rigidity guarding or rebound. Central Nervous System: Alert and oriented. No focal neurological deficits.  Labs:  Basic Metabolic Panel:  Lab 11/18/11 1610 11/17/11 1500  NA 135 136  K 4.4 4.6  CL 102 102  CO2 30 28  GLUCOSE 124* 108*  BUN 8 10  CREATININE 0.95 0.79  CALCIUM 8.1* 8.7  ALB -- --  PHOS -- --   Liver Function Tests:  Lab 11/17/11 1500  AST 29  ALT 32  ALKPHOS 68  BILITOT 0.2*  PROT 7.1  ALBUMIN 3.4*    Lab 11/17/11 1500  LIPASE 17  AMYLASE --   No results found for this basename: AMMONIA:3 in the last 168 hours CBC:  Lab 11/18/11 0405 11/17/11 1500  WBC 7.6 9.0  NEUTROABS -- 4.7  HGB 10.9* 11.5*  HCT 34.7* 35.5*  MCV 90.6 89.0  PLT 233 279   Cardiac Enzymes: No results found for this basename: CKTOTAL:5,CKMB:5,CKMBINDEX:5,TROPONINI:5 in the last 168 hours CBG:  Lab 11/19/11 1659 11/19/11 1200 11/19/11 0733 11/18/11 2147 11/18/11 1706  GLUCAP 120* 114* 144* 109* 130*    Iron Studies: No results found for this basename: IRON,TIBC,TRANSFERRIN,FERRITIN in the last 72 hours Studies/Results: No results found. Medications:    . sodium chloride 50 mL/hr at 11/18/11 1417      . ipratropium  0.5 mg Nebulization Q4H   And  . albuterol  2.5 mg  Nebulization Q4H  . azithromycin  500 mg Intravenous Q24H  . cefTRIAXone (ROCEPHIN)  IV  1 g Intravenous Q24H  . citalopram  20 mg Oral QAC breakfast  . citalopram  40 mg Oral QAC lunch  . docusate sodium  100 mg Oral BID  . enoxaparin  40 mg Subcutaneous Q24H  . gabapentin  600 mg Oral QID  . insulin aspart  0-15 Units Subcutaneous TID WC  . insulin aspart protamine-insulin aspart  15 Units Subcutaneous BID WC  . lactulose  20 g Oral BID  . levothyroxine  125 mcg Oral Daily  . lidocaine  1 patch Transdermal Q24H  . loratadine  10 mg Oral Daily  . metoCLOPramide (REGLAN) injection  5 mg Intravenous Q6H  . OLANZapine  10 mg Oral QHS  . pantoprazole  40 mg Oral Q1200  .  pneumococcal 23 valent vaccine  0.5 mL Intramuscular Tomorrow-1000  . traZODone  100 mg Oral QHS    I  have reviewed scheduled and prn medications.     Problem/Plan: Principal Problem:  *PNA (pneumonia) Active Problems:  Bipolar 1 disorder  GERD (gastroesophageal reflux disease)  DM (diabetes mellitus)  Nausea & vomiting  Hypothyroidism  Chronic back pain  SOB (shortness of breath)  1. Multilobar right-sided pneumonia: Differential diagnosis includes community-acquired pneumonia versus aspiration pneumonia during periods of intractable emesis. Clinically improving. Continue intravenous azithromycin and Zithromax. Blood cultures are negative to date. 2. Hypoxia secondary to problem #1: Oxygen supplementation and treatment per problem #1. 3. Acute on chronic nausea and vomiting: Acute flare may be secondary to viral gastroenteritis versus gastroparesis exacerbation complicating underlying chronic narcotic-related gastroparesis. Gastroenterology was consulted and Dr. Hulen Shouts input appreciated. Change to sips of clear liquids, minimize narcotics, use of Reglan and when necessary Zofran and monitor. 4. Chronic constipation: Secondary to opioids. Will check TSH. 5. Bipolar disorder: Continue home medications. 6. Gastroesophageal reflux disease: Continue proton pump inhibitor. EGD at some point during this admission. 7. Type 2 diabetes mellitus: Fair control. Continue insulins. CBGs range between 109-120 mg/dL. 8. Chronic back pain: Try to use least amount of opioids as possible. 9. Anemia: Possibly chronic. Follow CBCs tomorrow 10. Hypothyroidism: TSH recently was increased at 6.97. Will discuss with the patient, and if the Synthroid dose has not been recently increased, will increase and repeat thyroid function tests in 4-6 weeks.   Discussed with patient and she think she may be taking Celexa 20 mg by mouth daily but is not sure and will check with her daughter. She also tells [place] that  her Synthroid dose was increased about 2 weeks ago and hence we'll not increase her Synthroid at this time.  Ishita Mcnerney 11/19/2011,6:36 PM  LOS: 2 days

## 2011-11-20 LAB — BASIC METABOLIC PANEL
BUN: 6 mg/dL (ref 6–23)
CO2: 34 mEq/L — ABNORMAL HIGH (ref 19–32)
Calcium: 8.3 mg/dL — ABNORMAL LOW (ref 8.4–10.5)
Creatinine, Ser: 0.77 mg/dL (ref 0.50–1.10)

## 2011-11-20 LAB — CBC
MCH: 28.6 pg (ref 26.0–34.0)
MCV: 88.9 fL (ref 78.0–100.0)
Platelets: 243 10*3/uL (ref 150–400)
RDW: 12.7 % (ref 11.5–15.5)

## 2011-11-20 LAB — GLUCOSE, CAPILLARY

## 2011-11-20 MED ORDER — OXYMETAZOLINE HCL 0.05 % NA SOLN
1.0000 | Freq: Two times a day (BID) | NASAL | Status: AC
  Administered 2011-11-20 – 2011-11-22 (×4): 1 via NASAL
  Filled 2011-11-20: qty 15

## 2011-11-20 MED ORDER — ALBUTEROL SULFATE (5 MG/ML) 0.5% IN NEBU
2.5000 mg | INHALATION_SOLUTION | RESPIRATORY_TRACT | Status: DC | PRN
  Administered 2011-11-23: 2.5 mg via RESPIRATORY_TRACT
  Filled 2011-11-20: qty 0.5

## 2011-11-20 MED ORDER — SALINE SPRAY 0.65 % NA SOLN
2.0000 | NASAL | Status: DC | PRN
  Administered 2011-11-21: 2 via NASAL
  Filled 2011-11-20: qty 44

## 2011-11-20 NOTE — Progress Notes (Signed)
Subjective:   Frontal/sinus headache and congestion, nasal stuffiness. Cough with minimal white sputum. Mild dyspnea. Mild nausea but improved and no vomiting. No BMs yet. Overall does not feel well yet.  Objective  Vital signs in last 24 hours: Filed Vitals:   11/20/11 0233 11/20/11 0517 11/20/11 0751 11/20/11 1034  BP:  103/56    Pulse:  70    Temp:  98.4 F (36.9 C)    TempSrc:  Oral    Resp:  18    Height:      Weight:      SpO2: 95% 97% 97% 92%   Weight change:   Intake/Output Summary (Last 24 hours) at 11/20/11 1201 Last data filed at 11/20/11 1610  Gross per 24 hour  Intake 2112.5 ml  Output      0 ml  Net 2112.5 ml    Physical Exam:  General Exam: ill looking but comfortable. No obvious distress. Morbidly obese. Has a wet cloth on her for head. Respiratory System: Few basal crackles but otherwise clear to auscultation. No increased work of breathing.  Cardiovascular System: First and second heart sounds heard. Regular rate and rhythm. No JVD/murmurs.  Gastrointestinal System: Abdomen is non distended, soft and normal bowel sounds heard. Nontender. Central Nervous System: Alert and oriented. No focal neurological deficits. ENT: Mild frontal sinus tenderness.  Labs:  Basic Metabolic Panel:  Lab 11/20/11 9604 11/18/11 0405 11/17/11 1500  NA 140 135 136  K 3.9 4.4 4.6  CL 102 102 102  CO2 34* 30 28  GLUCOSE 116* 124* 108*  BUN 6 8 10   CREATININE 0.77 0.95 0.79  CALCIUM 8.3* 8.1* 8.7  ALB -- -- --  PHOS -- -- --   Liver Function Tests:  Lab 11/17/11 1500  AST 29  ALT 32  ALKPHOS 68  BILITOT 0.2*  PROT 7.1  ALBUMIN 3.4*    Lab 11/17/11 1500  LIPASE 17  AMYLASE --   No results found for this basename: AMMONIA:3 in the last 168 hours CBC:  Lab 11/20/11 0630 11/18/11 0405 11/17/11 1500  WBC 7.2 7.6 9.0  NEUTROABS -- -- 4.7  HGB 10.6* 10.9* 11.5*  HCT 33.0* 34.7* 35.5*  MCV 88.9 90.6 89.0  PLT 243 233 279   Cardiac Enzymes: No results  found for this basename: CKTOTAL:5,CKMB:5,CKMBINDEX:5,TROPONINI:5 in the last 168 hours CBG:  Lab 11/20/11 1150 11/20/11 0755 11/19/11 2151 11/19/11 1659 11/19/11 1200  GLUCAP 119* 121* 196* 120* 114*    Iron Studies: No results found for this basename: IRON,TIBC,TRANSFERRIN,FERRITIN in the last 72 hours Studies/Results: No results found. Medications:    . sodium chloride 50 mL/hr at 11/20/11 0545      . ipratropium  0.5 mg Nebulization Q4H   And  . albuterol  2.5 mg Nebulization Q4H  . azithromycin  500 mg Intravenous Q24H  . cefTRIAXone (ROCEPHIN)  IV  1 g Intravenous Q24H  . citalopram  20 mg Oral QAC breakfast  . docusate sodium  100 mg Oral BID  . enoxaparin  40 mg Subcutaneous Q24H  . gabapentin  600 mg Oral QID  . insulin aspart  0-15 Units Subcutaneous TID WC  . insulin aspart protamine-insulin aspart  15 Units Subcutaneous BID WC  . lactulose  20 g Oral BID  . levothyroxine  125 mcg Oral Daily  . lidocaine  1 patch Transdermal Q24H  . loratadine  10 mg Oral Daily  . metoCLOPramide (REGLAN) injection  5 mg Intravenous Q6H  . OLANZapine  10 mg Oral QHS  . pantoprazole  40 mg Oral Q1200  . traZODone  100 mg Oral QHS  . DISCONTD: citalopram  40 mg Oral QAC lunch    I  have reviewed scheduled and prn medications.     Problem/Plan: Principal Problem:  *PNA (pneumonia) Active Problems:  Bipolar 1 disorder  GERD (gastroesophageal reflux disease)  DM (diabetes mellitus)  Nausea & vomiting  Hypothyroidism  Chronic back pain  SOB (shortness of breath)  1. Multilobar right-sided pneumonia: Differential diagnosis includes community-acquired pneumonia versus aspiration pneumonia during periods of intractable emesis. Clinically improving. Continue intravenous azithromycin and Zithromax. Blood cultures are negative to date. 2. Hypoxia secondary to problem #1: Oxygen saturation at 92% on room air at rest. Monitor. Try to wean off oxygen as tolerated. 3. Acute on  chronic nausea and vomiting: Acute flare may be secondary to acute viral gastroenteritis versus gastroparesis exacerbation complicating underlying chronic gastroparesis (related to narcotics versus diabetes). Improving. GI following. Continue clear liquids, minimize narcotics, Reglan and when necessary Zofran and monitor. 4. ? Acute sinusitis/sinus congestion: Continue current antibiotics. Add Afrin and normal saline nasal sprays. Taper off oxygen as tolerated. 5. Chronic constipation: Secondary to opioids. Minimize opioids and bowel regimen. 6. Bipolar disorder: Continue home medications. Discussed with patient's daughter who indicates that patient has been on Celexa 20 mg by mouth daily for approximately 2 weeks and her M.D. is planning to taper her off. 7. Gastroesophageal reflux disease: Continue proton pump inhibitor. EGD at some point during this admission. 8. Type 2 diabetes mellitus: Fair control. Continue insulins.  9. Chronic back pain: Try to use least amount of opioids as possible. 10. Anemia: Possibly chronic. Stable. 11. Hypothyroidism: TSH recently was increased at 6.97. Synthroid was recently increased approximately 2 weeks ago. Followup thyroid function tests in 4 weeks.  Discussed with patient and her daughter who was at the bedside and updated care. Not medically ready for discharge.  Cheryl [name] 11/20/2011,12:01 PM  LOS: 3 days

## 2011-11-20 NOTE — Progress Notes (Signed)
Subjective: Nausea improving.  Less vomiting. Is not hungry. Still with some trouble breathing.  Objective: Vital signs in last 24 hours: Temp:  [97.6 F (36.4 C)-99 F (37.2 C)] 98.4 F (36.9 C) (03/12 0517) Pulse Rate:  [70-94] 70  (03/12 0517) Resp:  [18-20] 18  (03/12 0517) BP: (103-123)/(56-76) 103/56 mmHg (03/12 0517) SpO2:  [92 %-97 %] 92 % (03/12 1034) Weight change:  Last BM Date:  (PTA)  PE: GEN:  Still a bit tachypneic and otherwise appears worn-out but not acutely toxic-appearing ABD:  Protuberant, soft  Lab Results: CBC    Component Value Date/Time   WBC 7.2 11/20/2011 0630   RBC 3.71* 11/20/2011 0630   HGB 10.6* 11/20/2011 0630   HCT 33.0* 11/20/2011 0630   PLT 243 11/20/2011 0630   MCV 88.9 11/20/2011 0630   MCH 28.6 11/20/2011 0630   MCHC 32.1 11/20/2011 0630   RDW 12.7 11/20/2011 0630   LYMPHSABS 3.0 11/17/2011 1500   MONOABS 0.9 11/17/2011 1500   EOSABS 0.4 11/17/2011 1500   BASOSABS 0.0 11/17/2011 1500   Assessment:  1.  Multilobar pneumonia, ? Aspiration-related. 2.  Nausea, vomiting.  Slowly improving.  Suspect component of acute viral syndrome with likely chronic underlying gastroparesis (narcotics, diabetes). 3.  Constipation.  Suspect largely narcotic-related.  Plan:  1.  I have encouraged patient to try to get OOBTC, ambulate with assistance. 2.  Continue scheduled metoclopramide for another 24 hours, then readdress whether it should be continued. 3.  Continue prn ondansetron for breakthrough symptoms. 4.  Clear liquids only. 5.  Once patient's respiratory status and overall clinical picture improves, will need endoscopy +/- gastric emptying study.   Freddy Jaksch 11/20/2011, 10:56 AM

## 2011-11-21 LAB — GLUCOSE, CAPILLARY
Glucose-Capillary: 115 mg/dL — ABNORMAL HIGH (ref 70–99)
Glucose-Capillary: 102 mg/dL — ABNORMAL HIGH (ref 70–99)
Glucose-Capillary: 86 mg/dL (ref 70–99)
Glucose-Capillary: 152 mg/dL — ABNORMAL HIGH (ref 70–99)

## 2011-11-21 NOTE — Progress Notes (Signed)
Subjective: Feels better, tolerating clear liquid diet. Feels hungry.  Objective: Vital signs in last 24 hours: Temp:  [97.9 F (36.6 C)-98.4 F (36.9 C)] 97.9 F (36.6 C) (03/13 0455) Pulse Rate:  [85-87] 87  (03/13 0455) Resp:  [13-20] 13  (03/13 0455) BP: (117-128)/(71-75) 117/75 mmHg (03/13 0455) SpO2:  [91 %-95 %] 95 % (03/13 0455) Weight change:  Last BM Date: 11/18/11  Intake/Output from previous day: 03/12 0701 - 03/13 0700 In: 605 [P.O.:605] Out: -  Total I/O In: 800 [P.O.:800] Out: -    Physical Exam: General: Comfortable, alert, communicative, fully oriented, not short of breath at rest.  HEENT:  Mild clinical pallor, no jaundice, no conjunctival injection or discharge. Hydration status is fair. NECK:  Supple, JVP not seen, no carotid bruits, no palpable lymphadenopathy, no palpable goiter. CHEST:  Clinically clear to auscultation, no wheezes, no crackles. HEART:  Sounds 1 and 2 heard, normal, regular, no murmurs. ABDOMEN:  Obese, soft, non-tender, no palpable organomegaly, no palpable masses, normal bowel sounds. GENITALIA:  Not examined. LOWER EXTREMITIES:  No pitting edema, palpable peripheral pulses. MUSCULOSKELETAL SYSTEM:  Unremarkable. CENTRAL NERVOUS SYSTEM:  No focal neurologic deficit on gross examination.  Lab Results:  Basename 11/20/11 0630  WBC 7.2  HGB 10.6*  HCT 33.0*  PLT 243    Basename 11/20/11 0630  NA 140  K 3.9  CL 102  CO2 34*  GLUCOSE 116*  BUN 6  CREATININE 0.77  CALCIUM 8.3*   Recent Results (from the past 240 hour(s))  CULTURE, BLOOD (ROUTINE X 2)     Status: Normal (Preliminary result)   Collection Time   11/17/11  6:00 PM      Component Value Range Status Comment   Specimen Description BLOOD LEFT ARM   Final    Special Requests BOTTLES DRAWN AEROBIC AND ANAEROBIC 5CC   Final    Culture  Setup Time [id number]   Final    Culture     Final    Value:        BLOOD CULTURE RECEIVED NO GROWTH TO DATE CULTURE WILL BE  HELD FOR 5 DAYS BEFORE ISSUING A FINAL NEGATIVE REPORT   Report Status PENDING   Incomplete   CULTURE, BLOOD (ROUTINE X 2)     Status: Normal (Preliminary result)   Collection Time   11/17/11  6:10 PM      Component Value Range Status Comment   Specimen Description BLOOD LEFT HAND   Final    Special Requests BOTTLES DRAWN AEROBIC AND ANAEROBIC 5CC   Final    Culture  Setup Time [id number]   Final    Culture     Final    Value:        BLOOD CULTURE RECEIVED NO GROWTH TO DATE CULTURE WILL BE HELD FOR 5 DAYS BEFORE ISSUING A FINAL NEGATIVE REPORT   Report Status PENDING   Incomplete      Studies/Results: No results found.  Medications: Scheduled Meds:   . azithromycin  500 mg Intravenous Q24H  . cefTRIAXone (ROCEPHIN)  IV  1 g Intravenous Q24H  . citalopram  20 mg Oral QAC breakfast  . docusate sodium  100 mg Oral BID  . enoxaparin  40 mg Subcutaneous Q24H  . gabapentin  600 mg Oral QID  . insulin aspart  0-15 Units Subcutaneous TID WC  . insulin aspart protamine-insulin aspart  15 Units Subcutaneous BID WC  . lactulose  20 g Oral BID  . levothyroxine  125  mcg Oral Daily  . lidocaine  1 patch Transdermal Q24H  . loratadine  10 mg Oral Daily  . metoCLOPramide (REGLAN) injection  5 mg Intravenous Q6H  . OLANZapine  10 mg Oral QHS  . oxymetazoline  1 spray Each Nare BID  . pantoprazole  40 mg Oral Q1200  . traZODone  100 mg Oral QHS   Continuous Infusions:   . sodium chloride 50 mL/hr at 11/20/11 0545   PRN Meds:.acetaminophen, acetaminophen, albuterol, ALPRAZolam, HYDROcodone-acetaminophen, morphine, ondansetron (ZOFRAN) IV, ondansetron, sodium chloride  Assessment/Plan: 1. Multilobar right-sided pneumonia: This is community-acquired pneumonia versus aspiration pneumonia during periods of intractable emesis. Steady clinical improvement. Afebrile, wcc is normal, feels much better. On intravenous Azithromycin and Zithromax, day#5. Blood cultures are negative to date.  2.  Hypoxia secondary to problem #1: Now resolved Oxygen saturation at 92% on room air at rest. Weaning Oxygen as tolerated.  3. Acute on chronic nausea and vomiting: Acute flare may be secondary to acute viral gastroenteritis versus gastroparesis exacerbation, complicating underlying chronic gastroparesis (related to narcotics versus diabetes). Improving. Per Dr Dulce Sellar, GI, continue clear liquids, minimize narcotics, Reglan.  4. Query acute sinusitis/sinus congestion: Continue current antibiotics. On Afrin and normal saline nasal sprays. Clinically improved. 5. Chronic constipation: Secondary to opioids. Minimize opioids and bowel regimen.  6. Bipolar disorder: Stable on home medications. Per Dr Richardean Chimera discussion with patient's daughter, it appears that patient has been on Celexa 20 mg by mouth daily for approximately 2 weeks and her M.D. is planning to taper her off.  7. Gastroesophageal reflux disease: Continued on proton pump inhibitor. GI plans EGD at soon, +/- gastric emptying study.  8. Type 2 diabetes mellitus: Controlled on SSI/Lantus. 9. Chronic back pain: Not problematic. Minimizing  Opioids, as described above.  10. Anemia: Possibly chronic. Stable.  11. Hypothyroidism: TSH recently was increased at 6.97. Synthroid was recently increased approximately 2 weeks ago. Followup thyroid function tests in 4 weeks.     LOS: 4 days   Marine Lezotte,CHRISTOPHER 11/21/2011, 1:30 PM

## 2011-11-21 NOTE — Progress Notes (Signed)
Subjective: Breathing much improved. Tolerating liquids; is hungry.  Objective: Vital signs in last 24 hours: Temp:  [97.8 F (36.6 C)-98.4 F (36.9 C)] 97.8 F (36.6 C) (03/13 1440) Pulse Rate:  [74-87] 74  (03/13 1440) Resp:  [13-20] 16  (03/13 1440) BP: (117-128)/(71-79) 120/79 mmHg (03/13 1440) SpO2:  [91 %-95 %] 92 % (03/13 1440) Weight change:  Last BM Date: 11/18/11  PE: GEN:  Much less tachypneic-appearing ABD:  Protuberant  Lab Results: CBC    Component Value Date/Time   WBC 7.2 11/20/2011 0630   RBC 3.71* 11/20/2011 0630   HGB 10.6* 11/20/2011 0630   HCT 33.0* 11/20/2011 0630   PLT 243 11/20/2011 0630   MCV 88.9 11/20/2011 0630   MCH 28.6 11/20/2011 0630   MCHC 32.1 11/20/2011 0630   RDW 12.7 11/20/2011 0630   LYMPHSABS 3.0 11/17/2011 1500   MONOABS 0.9 11/17/2011 1500   EOSABS 0.4 11/17/2011 1500   BASOSABS 0.0 11/17/2011 1500   CMP     Component Value Date/Time   NA 140 11/20/2011 0630   K 3.9 11/20/2011 0630   CL 102 11/20/2011 0630   CO2 34* 11/20/2011 0630   GLUCOSE 116* 11/20/2011 0630   BUN 6 11/20/2011 0630   CREATININE 0.77 11/20/2011 0630   CALCIUM 8.3* 11/20/2011 0630   PROT 7.1 11/17/2011 1500   ALBUMIN 3.4* 11/17/2011 1500   AST 29 11/17/2011 1500   ALT 32 11/17/2011 1500   ALKPHOS 68 11/17/2011 1500   BILITOT 0.2* 11/17/2011 1500   GFRNONAA >90 11/20/2011 0630   GFRAA >90 11/20/2011 0630   Assessment:  1.  Nausea + vomiting. 2.  Constipation. 3.  Multilobar pneumonia, suspect aspiration-related.  Plan:  1.  Continue conservative antiemetic measures. 2.  Advance diet. 3.  Gastric emptying study tomorrow; likely endoscopy Friday (or as outpatient). 4.  Will follow.   Cheryl [name] 11/21/2011, 3:42 PM

## 2011-11-22 ENCOUNTER — Encounter (HOSPITAL_COMMUNITY): Payer: Self-pay | Admitting: Radiology

## 2011-11-22 ENCOUNTER — Inpatient Hospital Stay (HOSPITAL_COMMUNITY): Payer: Medicare Other

## 2011-11-22 LAB — CBC
Hemoglobin: 11.7 g/dL — ABNORMAL LOW (ref 12.0–15.0)
MCH: 28.6 pg (ref 26.0–34.0)
MCHC: 32.3 g/dL (ref 30.0–36.0)
RDW: 12.8 % (ref 11.5–15.5)

## 2011-11-22 LAB — COMPREHENSIVE METABOLIC PANEL
ALT: 21 U/L (ref 0–35)
Albumin: 3.3 g/dL — ABNORMAL LOW (ref 3.5–5.2)
Alkaline Phosphatase: 58 U/L (ref 39–117)
Calcium: 8.7 mg/dL (ref 8.4–10.5)
GFR calc Af Amer: >90 mL/min (ref >90)
Glucose, Bld: 113 mg/dL — ABNORMAL HIGH (ref 70–99)
Potassium: 4 mEq/L (ref 3.5–5.1)
Sodium: 142 mEq/L (ref 135–145)
Total Protein: 6.5 g/dL (ref 6.0–8.3)

## 2011-11-22 LAB — GLUCOSE, CAPILLARY

## 2011-11-22 MED ORDER — TECHNETIUM TC 99M SULFUR COLLOID
2.2000 | Freq: Once | INTRAVENOUS | Status: AC | PRN
  Administered 2011-11-22: 2.2 via INTRAVENOUS

## 2011-11-22 MED ORDER — METOCLOPRAMIDE HCL 5 MG/ML IJ SOLN
5.0000 mg | Freq: Three times a day (TID) | INTRAMUSCULAR | Status: DC
  Administered 2011-11-22 – 2011-11-23 (×4): 5 mg via INTRAVENOUS
  Filled 2011-11-22 (×6): qty 1

## 2011-11-22 MED ORDER — KETOROLAC TROMETHAMINE 15 MG/ML IJ SOLN
15.0000 mg | Freq: Once | INTRAMUSCULAR | Status: AC | PRN
  Administered 2011-11-22: 15 mg via INTRAVENOUS

## 2011-11-22 NOTE — Progress Notes (Signed)
Subjective: Tolerating advanced diet. Nauseated, but no vomiting.  Objective: Vital signs in last 24 hours: Temp:  [97.5 F (36.4 C)-98.5 F (36.9 C)] 97.5 F (36.4 C) (03/14 0449) Pulse Rate:  [74-91] 81  (03/14 0449) Resp:  [16-18] 18  (03/14 0449) BP: (94-122)/(65-79) 94/65 mmHg (03/14 0449) SpO2:  [91 %-92 %] 91 % (03/14 0449) Weight change:  Last BM Date: 11/21/11  Intake/Output from previous day: 03/13 0701 - 03/14 0700 In: 800 [P.O.:800] Out: -      Physical Exam: General: Comfortable, alert, communicative, fully oriented, not short of breath at rest.  HEENT:  Mild clinical pallor, no jaundice, no conjunctival injection or discharge. Hydration status is fair. NECK:  Supple, JVP not seen, no carotid bruits, no palpable lymphadenopathy, no palpable goiter. CHEST:  Clinically clear to auscultation, no wheezes, no crackles. HEART:  Sounds 1 and 2 heard, normal, regular, no murmurs. ABDOMEN:  Obese, soft, non-tender, no palpable organomegaly, no palpable masses, normal bowel sounds. GENITALIA:  Not examined. LOWER EXTREMITIES:  No pitting edema, palpable peripheral pulses. MUSCULOSKELETAL SYSTEM:  Unremarkable. CENTRAL NERVOUS SYSTEM:  No focal neurologic deficit on gross examination.  Lab Results:  Basename 11/22/11 0405 11/20/11 0630  WBC 6.7 7.2  HGB 11.7* 10.6*  HCT 36.2 33.0*  PLT 271 243    Basename 11/22/11 0405 11/20/11 0630  NA 142 140  K 4.0 3.9  CL 105 102  CO2 32 34*  GLUCOSE 113* 116*  BUN 7 6  CREATININE 0.79 0.77  CALCIUM 8.7 8.3*   Recent Results (from the past 240 hour(s))  CULTURE, BLOOD (ROUTINE X 2)     Status: Normal (Preliminary result)   Collection Time   11/17/11  6:00 PM      Component Value Range Status Comment   Specimen Description BLOOD LEFT ARM   Final    Special Requests BOTTLES DRAWN AEROBIC AND ANAEROBIC 5CC   Final    Culture  Setup Time [id number]   Final    Culture     Final    Value:        BLOOD CULTURE RECEIVED NO  GROWTH TO DATE CULTURE WILL BE HELD FOR 5 DAYS BEFORE ISSUING A FINAL NEGATIVE REPORT   Report Status PENDING   Incomplete   CULTURE, BLOOD (ROUTINE X 2)     Status: Normal (Preliminary result)   Collection Time   11/17/11  6:10 PM      Component Value Range Status Comment   Specimen Description BLOOD LEFT HAND   Final    Special Requests BOTTLES DRAWN AEROBIC AND ANAEROBIC 5CC   Final    Culture  Setup Time [id number]   Final    Culture     Final    Value:        BLOOD CULTURE RECEIVED NO GROWTH TO DATE CULTURE WILL BE HELD FOR 5 DAYS BEFORE ISSUING A FINAL NEGATIVE REPORT   Report Status PENDING   Incomplete      Studies/Results: Nm Gastric Emptying  11/22/2011  *RADIOLOGY REPORT*  Clinical Data:  Nausea and vomiting.  NUCLEAR MEDICINE GASTRIC EMPTYING SCAN  Technique:  After oral ingestion of radiolabeled meal, sequential abdominal images were obtained for 120 minutes.  Residual percentage of activity remaining within the stomach was calculated at 60 and 120 minutes.  Radiopharmaceutical:  2.2 mCi Tc-21m sulfur colloid.  Comparison:  None.  Findings: The patient had 5% retention at 2 hours.  IMPRESSION: Normal gastric emptying.  Original Report Authenticated By: Fayrene Fearing  H. Jena Gauss, M.D.    Medications: Scheduled Meds:    . azithromycin  500 mg Intravenous Q24H  . cefTRIAXone (ROCEPHIN)  IV  1 g Intravenous Q24H  . citalopram  20 mg Oral QAC breakfast  . docusate sodium  100 mg Oral BID  . enoxaparin  40 mg Subcutaneous Q24H  . gabapentin  600 mg Oral QID  . insulin aspart  0-15 Units Subcutaneous TID WC  . insulin aspart protamine-insulin aspart  15 Units Subcutaneous BID WC  . lactulose  20 g Oral BID  . levothyroxine  125 mcg Oral Daily  . lidocaine  1 patch Transdermal Q24H  . loratadine  10 mg Oral Daily  . OLANZapine  10 mg Oral QHS  . oxymetazoline  1 spray Each Nare BID  . pantoprazole  40 mg Oral Q1200  . traZODone  100 mg Oral QHS   Continuous Infusions:    .  sodium chloride 50 mL/hr at 11/21/11 1954   PRN Meds:.acetaminophen, acetaminophen, albuterol, ALPRAZolam, HYDROcodone-acetaminophen, ketorolac, morphine, ondansetron (ZOFRAN) IV, ondansetron, sodium chloride, technetium sulfur colloid  Assessment/Plan: 1. Multilobar right-sided pneumonia: This is community-acquired pneumonia versus aspiration pneumonia during periods of intractable emesis. Steady clinical improvement. Afebrile, wcc is normal, feels much better. On intravenous Azithromycin and Zithromax, day#5. Blood cultures are negative to date. Clinically appears practically resolved. Will repeat CXR today, and likey transition to oral antibiotics from 11/23/11, if no vomiting. 2. Hypoxia secondary to problem #1: Now resolved Oxygen saturation at 92% on room air at rest. Now resolved. Weaned off Oxygen.  3. Acute on chronic nausea and vomiting: Acute flare may be secondary to acute viral gastroenteritis versus gastroparesis exacerbation, complicating underlying chronic gastroparesis (related to narcotics versus diabetes). Improving. Managing per Dr Dulce Sellar, GI, minimize narcotics, Reglan.  4. Query acute sinusitis/sinus congestion: Continue current antibiotics. On Afrin and normal saline nasal sprays. Clinically improved. 5. Chronic constipation: Secondary to opioids. Minimize opioids and bowel regimen.  6. Bipolar disorder: Stable on home medications. Per Dr Richardean Chimera discussion with patient's daughter, it appears that patient has been on Celexa 20 mg by mouth daily for approximately 2 weeks and her M.D. is planning to taper her off.  7. Gastroesophageal reflux disease: Continued on proton pump inhibitor. GI plans EGD on 11/23/11. Gastric emptying study of 11/22/11, was negative. Patient has been reassured accordingly.  8. Type 2 diabetes mellitus: Controlled on SSI/Lantus. 9. Chronic back pain: Not problematic. Minimizing  Opioids, as described above.  10. Anemia: Possibly chronic. Stable.  11.  Hypothyroidism: TSH recently was increased at 6.97. Synthroid was recently increased approximately 2 weeks ago. Followup thyroid function tests in 4 weeks.  Comment: Possibly aim discharge on 11/24/11.l   LOS: 5 days   Cheryl [name],CHRISTOPHER 11/22/2011, 1:19 PM

## 2011-11-22 NOTE — Progress Notes (Signed)
Subjective: Still nauseated.  Not much vomiting. Gastric emptying study done today.  Objective: Vital signs in last 24 hours: Temp:  [97.5 F (36.4 C)-98.5 F (36.9 C)] 97.5 F (36.4 C) (03/14 0449) Pulse Rate:  [74-91] 81  (03/14 0449) Resp:  [16-18] 18  (03/14 0449) BP: (94-122)/(65-79) 94/65 mmHg (03/14 0449) SpO2:  [91 %-92 %] 91 % (03/14 0449) Weight change:  Last BM Date: 11/21/11  PE: GEN:  NAD, not tachypneic-appearing ABD:  Soft, protuberant.  Gastric emptying study:  Normal gastric emptying  Assessment:  1.  Nausea/vomiting. 2.  Pneumonia, ? Aspiration-related. 3.  Constipation.  Plan:  1.  Restart metoclopramide. 2.  Full liquid diet ok; would not advance further. 3.  Endoscopy tomorrow.   Cheryl [name] 11/22/2011, 1:52 PM

## 2011-11-23 ENCOUNTER — Encounter (HOSPITAL_COMMUNITY)
Admission: EM | Disposition: A | Discharge status: Home / Self Care | Payer: Self-pay | Source: Home / Self Care | Attending: Internal Medicine

## 2011-11-23 ENCOUNTER — Ambulatory Visit (HOSPITAL_COMMUNITY): Admit: 2011-11-23 | Payer: Self-pay | Admitting: Gastroenterology

## 2011-11-23 ENCOUNTER — Encounter (HOSPITAL_COMMUNITY): Payer: Self-pay | Admitting: Gastroenterology

## 2011-11-23 HISTORY — PX: ESOPHAGOGASTRODUODENOSCOPY: SHX5428

## 2011-11-23 LAB — GLUCOSE, CAPILLARY
Glucose-Capillary: 134 mg/dL — ABNORMAL HIGH (ref 70–99)
Glucose-Capillary: 101 mg/dL — ABNORMAL HIGH (ref 70–99)
Glucose-Capillary: 128 mg/dL — ABNORMAL HIGH (ref 70–99)

## 2011-11-23 LAB — CBC
HCT: 33.9 % — ABNORMAL LOW (ref 36.0–46.0)
Hemoglobin: 11.2 g/dL — ABNORMAL LOW (ref 12.0–15.0)
MCH: 28.9 pg (ref 26.0–34.0)
MCHC: 33 g/dL (ref 30.0–36.0)

## 2011-11-23 LAB — BASIC METABOLIC PANEL
BUN: 9 mg/dL (ref 6–23)
CO2: 30 mEq/L (ref 19–32)
Calcium: 8.7 mg/dL (ref 8.4–10.5)
GFR calc non Af Amer: >90 mL/min (ref >90)
Glucose, Bld: 117 mg/dL — ABNORMAL HIGH (ref 70–99)

## 2011-11-23 LAB — CULTURE, BLOOD (ROUTINE X 2)
Culture  Setup Time: 201303092234
Culture: NO GROWTH

## 2011-11-23 SURGERY — EGD (ESOPHAGOGASTRODUODENOSCOPY)
Anesthesia: Moderate Sedation

## 2011-11-23 MED ORDER — KETOROLAC TROMETHAMINE 15 MG/ML IJ SOLN
15.0000 mg | Freq: Once | INTRAMUSCULAR | Status: AC
  Administered 2011-11-23: 15 mg via INTRAVENOUS
  Filled 2011-11-23 (×2): qty 1

## 2011-11-23 MED ORDER — FENTANYL CITRATE 0.05 MG/ML IJ SOLN
INTRAMUSCULAR | Status: AC
  Filled 2011-11-23: qty 2

## 2011-11-23 MED ORDER — METOCLOPRAMIDE HCL 10 MG PO TABS
10.0000 mg | ORAL_TABLET | Freq: Three times a day (TID) | ORAL | Status: DC
  Administered 2011-11-23 – 2011-11-24 (×2): 10 mg via ORAL
  Filled 2011-11-23 (×5): qty 1

## 2011-11-23 MED ORDER — MOXIFLOXACIN HCL 400 MG PO TABS
400.0000 mg | ORAL_TABLET | Freq: Every day | ORAL | Status: DC
  Administered 2011-11-23: 400 mg via ORAL
  Filled 2011-11-23 (×2): qty 1

## 2011-11-23 MED ORDER — MIDAZOLAM HCL 10 MG/2ML IJ SOLN
INTRAMUSCULAR | Status: AC
  Filled 2011-11-23: qty 2

## 2011-11-23 MED ORDER — SODIUM CHLORIDE 0.9 % IJ SOLN
10.0000 mL | Freq: Two times a day (BID) | INTRAMUSCULAR | Status: DC
  Administered 2011-11-23 – 2011-11-24 (×2): 10 mL via INTRAVENOUS

## 2011-11-23 MED ORDER — FENTANYL NICU IV SYRINGE 50 MCG/ML
INJECTION | INTRAMUSCULAR | Status: DC | PRN
  Administered 2011-11-23 (×2): 25 ug via INTRAVENOUS

## 2011-11-23 MED ORDER — SODIUM CHLORIDE 0.9 % IV SOLN
Freq: Once | INTRAVENOUS | Status: AC
  Administered 2011-11-23: 500 mL via INTRAVENOUS

## 2011-11-23 MED ORDER — MIDAZOLAM HCL 10 MG/2ML IJ SOLN
INTRAMUSCULAR | Status: DC | PRN
  Administered 2011-11-23 (×2): 1 mg via INTRAVENOUS

## 2011-11-23 MED FILL — Insulin Aspart Inj 100 Unit/ML: SUBCUTANEOUS | Qty: 0.02 | Status: AC

## 2011-11-23 NOTE — Discharge Summary (Signed)
Physician Discharge Summary  Patient ID: Cheryl [name] MRN: [id number] DOB/AGE: 1956/03/26 56 y.o.  Admit date: 11/17/2011 Discharge date: 11/24/2011  Primary Care Physician:  Quitman Livings, MD, MD   Discharge Diagnoses:    Patient Active Problem List  Diagnoses  . Bipolar 1 disorder  . GERD (gastroesophageal reflux disease)  . DM (diabetes mellitus)  . PNA (pneumonia)  . Nausea & vomiting  . Hypothyroidism  . Chronic back pain  . SOB (shortness of breath)    Medication List  As of 11/24/2011 11:44 AM   STOP taking these medications         ALKA-SELTZER PLUS DAY COLD/FLU 10-5-325 MG Caps      citalopram 40 MG tablet      HYDROcodone-acetaminophen 5-500 MG per tablet      metFORMIN 500 MG tablet      promethazine 25 MG tablet         TAKE these medications         acetaminophen 500 MG tablet   Commonly known as: TYLENOL   Take 1,000 mg by mouth every 6 (six) hours as needed. For pain      albuterol 108 (90 BASE) MCG/ACT inhaler   Commonly known as: PROVENTIL HFA;VENTOLIN HFA   Inhale 2 puffs into the lungs every 6 (six) hours as needed. For shortness of breath      ALPRAZolam 0.5 MG tablet   Commonly known as: XANAX   Take 0.5 mg by mouth 3 (three) times daily as needed. For anxiety      citalopram 20 MG tablet   Commonly known as: CELEXA   Take 20 mg by mouth daily.      docusate sodium 100 MG capsule   Commonly known as: COLACE   Take 100 mg by mouth 2 (two) times daily.      gabapentin 600 MG tablet   Commonly known as: NEURONTIN   Take 600 mg by mouth 4 (four) times daily.      insulin NPH-insulin regular (70-30) 100 UNIT/ML injection   Commonly known as: NOVOLIN 70/30   Inject 20-26 Units into the skin See admin instructions. Take 26 units at 7am and 20 units and 7pm      lactulose 10 GM/15ML solution   Commonly known as: CHRONULAC   Take 30 mLs (20 g total) by mouth 2 (two) times daily.      levothyroxine 125 MCG tablet   Commonly known as:  SYNTHROID, LEVOTHROID   Take 125 mcg by mouth daily.      lidocaine 5 %   Commonly known as: LIDODERM   Place 1 patch onto the skin daily. Remove & Discard patch within 12 hours or as directed by MD      loratadine 10 MG tablet   Commonly known as: CLARITIN   Take 10 mg by mouth daily.      metoCLOPramide 10 MG tablet   Commonly known as: REGLAN   Take 1 tablet (10 mg total) by mouth 4 (four) times daily -  before meals and at bedtime.      moxifloxacin 400 MG tablet   Commonly known as: AVELOX   Take 1 tablet (400 mg total) by mouth daily at 6 PM.      OLANZapine 10 MG tablet   Commonly known as: ZYPREXA   Take 10 mg by mouth at bedtime.      pantoprazole 40 MG tablet   Commonly known as: PROTONIX   Take 1 tablet (40  mg total) by mouth daily at 12 noon.      traMADol 50 MG tablet   Commonly known as: ULTRAM   Take 1 tablet (50 mg total) by mouth every 8 (eight) hours as needed for pain.      traZODone 100 MG tablet   Commonly known as: DESYREL   Take 100 mg by mouth at bedtime.             Disposition and Follow-up:  Follow up with primary MD and with Dr Willis Modena, gastroenterologist.  Consults:  GI  Dr Willis Modena, gastroenterologist.  Significant Diagnostic Studies:  Ct Angio Chest W/cm &/or Wo Cm  11/17/2011  *RADIOLOGY REPORT*  Clinical Data: Shortness of breath.  Emesis.  CT ANGIOGRAPHY CHEST  Technique:  Multidetector CT imaging of the chest using the standard protocol during bolus administration of intravenous contrast. Multiplanar reconstructed images including MIPs were obtained and reviewed to evaluate the vascular anatomy.  Contrast: OMNIPAQUE IOHEXOL 300 MG/ML IJ SOLN  Comparison: Chest CT 06/07/2010  Findings:  Mediastinum: No filling defects within the pulmonary arterial tree to suggest underlying pulmonary embolism. Heart size is mildly enlarged.  Numerous borderline enlarged and mildly enlarged mediastinal and hilar lymph nodes are noted,  the largest of which is in the low right paratracheal station measuring 1.2 cm in short axis.  There is a small hiatal hernia. No acute abnormality of the thoracic aorta; specifically, no aneurysm or dissection.  Lungs/Pleura: Mild diffuse bronchial wall thickening (right lung greater than left).  Throughout the right lung there are patchy areas of peribronchovascular ground-glass attenuation.  Left lung appears relatively clear.  Minimal dependent bibasilar subsegmental atelectasis.  No definite suspicious appearing pulmonary nodules or masses are identified.  Upper Abdomen: Diffusely decreased attenuation throughout the hepatic parenchyma, consistent with hepatic steatosis.  Otherwise unremarkable.  Musculoskeletal: There are no aggressive appearing lytic or blastic lesions noted in the visualized portions of the skeleton.  IMPRESSION: 1.  No evidence of pulmonary embolism. 2.  Mild diffuse bronchial wall thickening (right lung greater than left), with patchy areas of peribronchovascular ground-glass attenuation in the right lung, likely reflecting an acute multilobar pneumonia. The numerous borderline enlarged and minimally enlarged mediastinal and hilar lymph nodes are presumably reactive. 3.  Mild cardiomegaly. 4.  Hepatic steatosis. 5.  Small hiatal hernia.  Original Report Authenticated By: Florencia Reasons, M.D.    Brief H and P: For complete details, refer to admission H and P. However, in brief, this is a 56 year old female, with a past medical history significant for hypothyroidism, diabetes mellitus (on insulin), bipolar affective disorder, chronic back pain and history of gastroesophageal reflux disease, presenting with a 3-4 day history of shortness of breath, cough and nausea/vomiting. She had been to ED at [facility], wherer she was reportedly, found to have CAP and was started on oral Levaquin. As she was unable to keep anything down she returned to [facility] ED and was found on initial  evaluation, to have O2 sats of 88% on RA. CTA demonstrated multilobar pneumonia, and she was admitted for further evaluation, investigation and management.   Physical Exam: On 11/24/11. General: Comfortable, alert, communicative, fully oriented, not short of breath at rest.  HEENT: Mild clinical pallor, no jaundice, no conjunctival injection or discharge. Hydration status is fair.  NECK: Supple, JVP not seen, no carotid bruits, no palpable lymphadenopathy, no palpable goiter.  CHEST: Clinically clear to auscultation, no wheezes, no crackles.  HEART: Sounds 1 and 2 heard,  normal, regular, no murmurs.  ABDOMEN: Obese, soft, non-tender, no palpable organomegaly, no palpable masses, normal bowel sounds.  GENITALIA: Not examined.  LOWER EXTREMITIES: No pitting edema, palpable peripheral pulses.  MUSCULOSKELETAL SYSTEM: Unremarkable.  CENTRAL NERVOUS SYSTEM: No focal neurologic deficit on gross examination.   Hospital Course:  1. Multilobar right-sided pneumonia: Patient presented as described above, and was felt to have community-acquired pneumonia versus aspiration pneumonia occasioned by bouts of intractable emesis. She was managed with intravenous Azithromycin and Zithromax, and had completed 6 days of a planned 10 days of therapy, as of 11/23/11. Patient experienced steady clinical improvement, remained afebrile, wcc normalized, and she felt much better. Blood cultures are negative to date. Repeat CXR of 11/22/11, showed resolved right lung pneumonia by plain radiography, and clinically pneumonia appears practically resolved. We transitioned patient to oral Avelox on 11/23/11, for a further 5 days of therapy.  2. Acute hypoxic respiratory failure: This was secondary to problem #1, and is now resolved. Patient's Oxygen saturation is consistently at or over at 92% on room air at rest.  3. Acute on chronic nausea and vomiting: The acute flare may have been secondary to acute viral gastroenteritis versus  gastroparesis exacerbation, complicating underlying chronic gastroparesis (related to narcotics versus diabetes). Patient was managed with Reglan, with improvement in symptomatology. GI consultation was provided by Dr Willis Modena, and gastric emptying study of 11/22/11, was negative, with a residual of 2% at 2 hours. Patient has been reassured accordingly. Dr Dulce Sellar has recommended minimizing narcotics.  4. Query acute sinusitis/sinus congestion: Patient complained of nasal congestion in the initial days of hospitalization, and this speedily responded to Afrin and normal saline nasal sprays.  5. Chronic constipation: This has been an on-going issue, and is felt to be secondary to opioids. Patient responded satisfactorily to minimization of opioids and a good bowel regimen.  6. Bipolar disorder: Patient remained stable on pre-admission psychotropic medications. Per Dr Richardean Chimera discussion with patient's daughter, it appears that patient has been on Celexa 20 mg by mouth daily for approximately 2 weeks and her M.D. is planning to taper her off.  7. Gastroesophageal reflux disease: Managed with proton pump inhibitor. Patient is status post EGD on 11/23/11, which showed atrophic stomach and subtle scalloping of duodenal folds, biopsied. Otherwise normal endoscopy. Continued PPI is recommended by GI.  8. Type 2 diabetes mellitus: This was well controlled on controlled on SSI/Insulin (70/30). Metformin has been discontinued, due to GI symptoms. Patient will continue her usual home insulin regimen on discharge. 9. Chronic back pain: Not problematic. Minimized opioids, as described above.  10. Anemia: Possibly chronic. Stable. Hemoglobin was 11.2 on 11/23/11. 11. Hypothyroidism: TSH recently was increased at 6.97. Synthroid was recently increased approximately 2 weeks ago. Followup thyroid function tests in 4 weeks shall be deferred to PMD.  Comment: Stable for discharge on 11/24/11.   Time spent on  Discharge: 45 mins.  Signed: Alyanna Stoermer,CHRISTOPHER 11/24/2011, 11:44 AM

## 2011-11-23 NOTE — Interval H&P Note (Signed)
History and Physical Interval Note:  11/23/2011 9:19 AM  Cheryl [name]  has presented today for surgery, with the diagnosis of nausea / vomiting  The various methods of treatment have been discussed with the patient and family. After consideration of risks, benefits and other options for treatment, the patient has consented to  Procedure(s) (LRB): ESOPHAGOGASTRODUODENOSCOPY (EGD) (N/A) as a surgical intervention .  The patients' history has been reviewed, patient examined, no change in status, stable for surgery.  I have reviewed the patients' chart and labs.  Questions were answered to the patient's satisfaction.     Cheryl [name] M  Assessment:  Persistent nausea/vomiting.  PLAN:  Endoscopy.  Risks (bleeding, infection, bowel perforation that could require surgery, sedation-related changes in cardiopulmonary systems), benefits (identification and possible treatment of source of symptoms, exclusion of certain causes of symptoms), and alternatives (watchful waiting, radiographic imaging studies, empiric medical treatment) of upper endoscopy (EGD) were explained to patient in detail and she wishes to proceed.

## 2011-11-23 NOTE — Op Note (Signed)
[facility] [address] [place], [place]  [address]  ENDOSCOPY PROCEDURE REPORT  PATIENTClara, [name]  MR#:  [id number] BIRTHDATE:  Jul 02, 1956, 56 yrs. old  GENDER:  female  ENDOSCOPIST:  Willis Modena, MD Referred by:  Isidor Holts, MD [facility])  PROCEDURE DATE:  11/23/2011 PROCEDURE:  EGD with biopsy, 57[address] ASA CLASS:  Class III INDICATIONS:  abdominal pain, nausea, vomiting  MEDICATIONS:  Cetacaine spray x 2, Fentanyl 50 mcg IV, Versed 2 mg IV  DESCRIPTION OF PROCEDURE:   After the risks benefits and alternatives of the procedure were thoroughly explained, informed consent was obtained.  The Pentax Gastroscope [id number] endoscope was introduced through the mouth and advanced to the second portion of the duodenum, without limitations.  The instrument was slowly withdrawn as the mucosa was fully examined.  <<PROCEDUREIMAGES>>  FINDINGS:  Normal esophagus.  Very atrophic stomach with little rugal fold definition; biopsies were taken.  Stomach otherwise normal without evidence of ulcer, mass, or other abnormality. Normal pylorus without evidence of gastric outlet obstruction. Subtle scalloping of duodenal folds, unclear significance, biopsied with cold forceps, otherwise normal duodenum.  ENDOSCOPIC IMPRESSION:    1.  Atrophic stomach and subtle scalloping of duodenal folds, biopsied. 2.  Otherwise normal endoscopy.  RECOMMENDATIONS:      1.  Watch for potential complications of procedure. 2.  Await biopsy  results 3.  PPI therapy. 4.  Try to advance diet; continue supportive antiemetics.  ______________________________ Willis Modena  n. eSIGNEDWillis Modena at 11/23/2011 10:01 AM  [name], [name], [phone]

## 2011-11-23 NOTE — Progress Notes (Signed)
Subjective: Tolerating advanced diet.  Objective: Vital signs in last 24 hours: Temp:  [97.7 F (36.5 C)-98.6 F (37 C)] 97.8 F (36.6 C) (03/15 1355) Pulse Rate:  [66-82] 66  (03/15 1355) Resp:  [13-59] 17  (03/15 1355) BP: (107-146)/(53-80) 108/67 mmHg (03/15 1355) SpO2:  [90 %-97 %] 95 % (03/15 1355) Weight:  [105.235 kg (232 lb)] 105.235 kg (232 lb) (03/15 0849) Weight change:  Last BM Date: 11/21/11  Intake/Output from previous day:       Physical Exam: General: Comfortable, alert, communicative, fully oriented, not short of breath at rest.  HEENT:  Mild clinical pallor, no jaundice, no conjunctival injection or discharge. Hydration status is fair. NECK:  Supple, JVP not seen, no carotid bruits, no palpable lymphadenopathy, no palpable goiter. CHEST:  Clinically clear to auscultation, no wheezes, no crackles. HEART:  Sounds 1 and 2 heard, normal, regular, no murmurs. ABDOMEN:  Obese, soft, non-tender, no palpable organomegaly, no palpable masses, normal bowel sounds. GENITALIA:  Not examined. LOWER EXTREMITIES:  No pitting edema, palpable peripheral pulses. MUSCULOSKELETAL SYSTEM:  Unremarkable. CENTRAL NERVOUS SYSTEM:  No focal neurologic deficit on gross examination.  Lab Results:  Basename 11/23/11 0425 11/22/11 0405  WBC 6.4 6.7  HGB 11.2* 11.7*  HCT 33.9* 36.2  PLT 255 271    Basename 11/23/11 0425 11/22/11 0405  NA 139 142  K 3.9 4.0  CL 104 105  CO2 30 32  GLUCOSE 117* 113*  BUN 9 7  CREATININE 0.79 0.79  CALCIUM 8.7 8.7   Recent Results (from the past 240 hour(s))  CULTURE, BLOOD (ROUTINE X 2)     Status: Normal   Collection Time   11/17/11  6:00 PM      Component Value Range Status Comment   Specimen Description BLOOD LEFT ARM   Final    Special Requests BOTTLES DRAWN AEROBIC AND ANAEROBIC 5CC   Final    Culture  Setup Time [id number]   Final    Culture NO GROWTH 5 DAYS   Final    Report Status 11/23/2011 FINAL   Final   CULTURE, BLOOD  (ROUTINE X 2)     Status: Normal   Collection Time   11/17/11  6:10 PM      Component Value Range Status Comment   Specimen Description BLOOD LEFT HAND   Final    Special Requests BOTTLES DRAWN AEROBIC AND ANAEROBIC 5CC   Final    Culture  Setup Time [id number]   Final    Culture NO GROWTH 5 DAYS   Final    Report Status 11/23/2011 FINAL   Final      Studies/Results: Dg Chest 2 View  11/22/2011  *RADIOLOGY REPORT*  Clinical Data: Pneumonia  CHEST - 2 VIEW  Comparison: 11/17/2011 chest CT, 06/22/2010 chest x-ray  Findings: Improvement in the right lung diffuse patchy airspace process present on prior CT.  Lungs clear.  Chronic elevation of the right hemidiaphragm.  Borderline heart size but negative for CHF or edema.  No effusion or pneumothorax  IMPRESSION: Resolved right lung pneumonia by plain radiography.  Stable exam.  Original Report Authenticated By: Judie Petit. Ruel Favors, M.D.   Nm Gastric Emptying  11/22/2011  *RADIOLOGY REPORT*  Clinical Data:  Nausea and vomiting.  NUCLEAR MEDICINE GASTRIC EMPTYING SCAN  Technique:  After oral ingestion of radiolabeled meal, sequential abdominal images were obtained for 120 minutes.  Residual percentage of activity remaining within the stomach was calculated at 60 and 120 minutes.  Radiopharmaceutical:  2.2 mCi Tc-27m sulfur colloid.  Comparison:  None.  Findings: The patient had 5% retention at 2 hours.  IMPRESSION: Normal gastric emptying.  Original Report Authenticated By: Gwynn Burly, M.D.    Medications: Scheduled Meds:    . sodium chloride   Intravenous Once  . azithromycin  500 mg Intravenous Q24H  . cefTRIAXone (ROCEPHIN)  IV  1 g Intravenous Q24H  . citalopram  20 mg Oral QAC breakfast  . docusate sodium  100 mg Oral BID  . enoxaparin  40 mg Subcutaneous Q24H  . gabapentin  600 mg Oral QID  . insulin aspart  0-15 Units Subcutaneous TID WC  . insulin aspart protamine-insulin aspart  15 Units Subcutaneous BID WC  . ketorolac  15 mg  Intravenous Once  . lactulose  20 g Oral BID  . levothyroxine  125 mcg Oral Daily  . lidocaine  1 patch Transdermal Q24H  . loratadine  10 mg Oral Daily  . metoCLOPramide (REGLAN) injection  5 mg Intravenous Q8H  . OLANZapine  10 mg Oral QHS  . oxymetazoline  1 spray Each Nare BID  . pantoprazole  40 mg Oral Q1200  . traZODone  100 mg Oral QHS   Continuous Infusions:    . sodium chloride 50 mL/hr at 11/21/11 1954   PRN Meds:.acetaminophen, acetaminophen, albuterol, ALPRAZolam, HYDROcodone-acetaminophen, morphine, ondansetron (ZOFRAN) IV, ondansetron, sodium chloride, DISCONTD: fentaNYL, DISCONTD: midazolam  Assessment/Plan: 1. Multilobar right-sided pneumonia: This is community-acquired pneumonia versus aspiration pneumonia during periods of intractable emesis. Patient has experienced steady clinical improvement. She is afebrile, wcc is normal, and she feels much better. On intravenous Azithromycin and Zithromax, day#6. Blood cultures are negative to date. Clinically appears practically resolved. Repeat CXR of 11/22/11, showed resolved right lung pneumonia by plain radiography. We have transitioned patient to oral Avelox, for a further 5 days of therapy. 2. Hypoxia secondary to problem #1: Now resolved Oxygen saturation at 92% on room air at rest. Now resolved. Weaned off Oxygen.  3. Acute on chronic nausea and vomiting: Acute flare may be secondary to acute viral gastroenteritis versus gastroparesis exacerbation, complicating underlying chronic gastroparesis (related to narcotics versus diabetes). Improving. Gastric emptying study of 11/22/11, was negative. Patient has been reassured accordingly. Managing per Dr Dulce Sellar, GI, minimize narcotics. We shall change Reglan to PO. 4. Query acute sinusitis/sinus congestion: Continue current antibiotics. On Afrin and normal saline nasal sprays. Clinically improved. 5. Chronic constipation: Secondary to opioids. Minimize opioids and bowel regimen.  6.  Bipolar disorder: Stable on home medications. Per Dr Richardean Chimera discussion with patient's daughter, it appears that patient has been on Celexa 20 mg by mouth daily for approximately 2 weeks and her M.D. is planning to taper her off.  7. Gastroesophageal reflux disease: Continued on proton pump inhibitor. Status post EGD on 11/23/11, which showed atrophic stomach and subtle scalloping of duodenal folds, biopsied. Otherwise normal endoscopy. PPI recommended. 8. Type 2 diabetes mellitus: Controlled on SSI/Lantus. 9. Chronic back pain: Not problematic. Minimizing  Opioids, as described above.  10. Anemia: Possibly chronic. Stable.  11. Hypothyroidism: TSH recently was increased at 6.97. Synthroid was recently increased approximately 2 weeks ago. Followup thyroid function tests in 4 weeks.  Comment: Possibly aim discharge on 11/24/11.l   LOS: 6 days   Vashti Bolanos,CHRISTOPHER 11/23/2011, 4:25 PM

## 2011-11-24 LAB — BASIC METABOLIC PANEL
CO2: 26 mEq/L (ref 19–32)
Chloride: 105 mEq/L (ref 96–112)
Creatinine, Ser: 0.76 mg/dL (ref 0.50–1.10)
GFR calc Af Amer: >90 mL/min (ref >90)
Potassium: 3.7 mEq/L (ref 3.5–5.1)

## 2011-11-24 LAB — CBC
HCT: 35 % — ABNORMAL LOW (ref 36.0–46.0)
MCV: 88.2 fL (ref 78.0–100.0)
Platelets: 267 10*3/uL (ref 150–400)
RBC: 3.97 MIL/uL (ref 3.87–5.11)
WBC: 5.9 10*3/uL (ref 4.0–10.5)

## 2011-11-24 LAB — GLUCOSE, CAPILLARY: Glucose-Capillary: 128 mg/dL — ABNORMAL HIGH (ref 70–99)

## 2011-11-24 MED ORDER — PANTOPRAZOLE SODIUM 40 MG PO TBEC: 40.0000 mg | DELAYED_RELEASE_TABLET | Freq: Every day | ORAL | Status: DC

## 2011-11-24 MED ORDER — LACTULOSE 10 GM/15ML PO SOLN: 20.0000 g | Freq: Two times a day (BID) | ORAL | Status: AC

## 2011-11-24 MED ORDER — TRAMADOL HCL 50 MG PO TABS: 50.0000 mg | ORAL_TABLET | Freq: Three times a day (TID) | ORAL | Status: AC | PRN

## 2011-11-24 MED ORDER — METOCLOPRAMIDE HCL 10 MG PO TABS: 10.0000 mg | ORAL_TABLET | Freq: Three times a day (TID) | ORAL | Status: AC

## 2011-11-24 MED ORDER — MOXIFLOXACIN HCL 400 MG PO TABS: 400.0000 mg | ORAL_TABLET | Freq: Every day | ORAL | Status: AC

## 2011-11-24 NOTE — Progress Notes (Signed)
DC to home. To car by wc. No change from am assessment.Cheryl [name]  

## 2011-11-26 ENCOUNTER — Encounter (HOSPITAL_COMMUNITY): Payer: Self-pay | Admitting: Gastroenterology

## 2012-01-08 ENCOUNTER — Encounter: Payer: Self-pay | Admitting: Internal Medicine

## 2012-01-09 ENCOUNTER — Ambulatory Visit: Payer: Medicare Other | Admitting: Internal Medicine

## 2012-01-15 ENCOUNTER — Encounter: Payer: Self-pay | Admitting: Obstetrics and Gynecology

## 2012-01-21 ENCOUNTER — Encounter (HOSPITAL_COMMUNITY): Payer: Self-pay | Admitting: Pharmacy Technician

## 2012-01-22 ENCOUNTER — Other Ambulatory Visit: Payer: Self-pay | Admitting: Obstetrics & Gynecology

## 2012-01-23 NOTE — Patient Instructions (Signed)
18 Cheryl [name]  01/23/2012   Your procedure is scheduled on:  5.22.13  Report to [facility] at 1100 AM.  Call this number if you have problems the morning of surgery: [phone]   Remember:   Do not eat food:After Midnight.  May have clear liquids:until Midnight .  Clear liquids include soda, tea, black coffee, apple or grape juice, broth.  Take these medicines the morning of surgery with A SIP OF WATER: xanax, synthroid, neurontin.   Do not wear jewelry, make-up or nail polish.  Do not wear lotions, powders, or perfumes. You may wear deodorant.  Do not shave 48 hours prior to surgery. Men may shave face and neck.  Do not bring valuables to the hospital.  Contacts, dentures or bridgework may not be worn into surgery.  Leave suitcase in the car. After surgery it may be brought to your room.  For patients admitted to the hospital, checkout time is 11:00 AM the day of discharge.   Patients discharged the day of surgery will not be allowed to drive home.  Name and phone number of your driver: family  Special Instructions: CHG Shower Use Special Wash: 1/2 bottle night before surgery and 1/2 bottle morning of surgery.   Please read over the following fact sheets that you were given: Pain Booklet, MRSA Information, Surgical Site Infection Prevention, Anesthesia Post-op Instructions and Care and Recovery After Surgery   PATIENT INSTRUCTIONS POST-ANESTHESIA  IMMEDIATELY FOLLOWING SURGERY:  Do not drive or operate machinery for the first twenty four hours after surgery.  Do not make any important decisions for twenty four hours after surgery or while taking narcotic pain medications or sedatives.  If you develop intractable nausea and vomiting or a severe headache please notify your doctor immediately.  FOLLOW-UP:  Please make an appointment with your surgeon as instructed. You do not need to follow up with anesthesia unless specifically instructed to do so.  WOUND CARE INSTRUCTIONS (if  applicable):  Keep a dry clean dressing on the anesthesia/puncture wound site if there is drainage.  Once the wound has quit draining you may leave it open to air.  Generally you should leave the bandage intact for twenty four hours unless there is drainage.  If the epidural site drains for more than 36-48 hours please call the anesthesia department.  QUESTIONS?:  Please feel free to call your physician or the hospital operator if you have any questions, and they will be happy to assist you.     [facility] Anesthesia Department [address] [place] [phone]    Bilateral Salpingo-Oophorectomy Removal of both fallopian tubes and ovaries is called a Bilateral Salpingo-oophorectomy (BSO). The fallopian tubes transport the egg from the ovary to the womb (uterus). The fallopian tube is also where the sperm and egg meet and become fertilized and move down into the uterus. Usually when a BSO is done, the uterus was previously removed. Removing both tubes and ovaries will:  Put you into the menopause. You will no longer have menstrual periods.   May cause you to have symptoms of menopause (hot flashes, night sweats, mood changes).   Not affect your sex drive or physical relationship.   Cause you to not be able to become pregnant (sterile).  LET YOUR CAREGIVER KNOW ABOUT:  Allergies to food or medication.   Medications taken including herbs, eye drops, over-the-counter medications, and creams.   Use of steroids (by mouth or creams).   Previous problems with anesthetics or  numbing medication.   Possibility of pregnancy, if this applies.   Your smoking habits   History of blood clots (thrombophlebitis).   History of bleeding or blood problems.   Previous surgeries.   Other health problems.  RISKS AND COMPLICATIONS All surgery is associated with risks. Some of these risks are:  Injury to surrounding organs.   Bleeding.   Infection.   Blood clots in the legs or  lungs.   Problems with the anesthesia.   The surgery does not help the problem.   Death.  BEFORE THE PROCEDURE  Do not take aspirin or blood thinners because it can make you bleed.   Do not eat or drink anything at least 8 hours before the surgery.   Let your caregiver know if you develop a cold or an infection.   If you are being admitted the day of surgery, arrive at least 1 hour before the surgery to read and sign the necessary forms and consents.   Arrange for help when you go home from the hospital.   If you smoke, do not smoke for at least 2 weeks before the surgery.  PROCEDURE  You will change into a hospital gown. Then, you will be given an IV (intravenous) and a medication to relax you. You will be put to sleep with an anesthetic. Any hair on your lower belly (abdomen) will be removed, and a catheter will be placed in your bladder. The fallopian tubes and ovaries will be removed either through 2 very small cuts (incisions) or through large incision in the lower abdomen. AFTER THE PROCEDURE  You will be taken to the recovery room for 1 to 3 hours until your blood pressure, pulse, and temperature are stable and you are waking up.   If you had a laparoscopy, you may be discharged in several hours.   If you had a laparoscopy, you may have shoulder pain for a day or two from air left in the abdomen. The air can irritate the nerve that goes from the diaphragm to the shoulder.   You will be given pain medication as is necessary.   The intravenous and catheter will be removed.   Have someone available to take you home from the hospital.  HOME CARE INSTRUCTIONS   Only take over-the-counter or prescription medicines for pain, discomfort, or fever as directed by your caregiver.   Do not take aspirin. It can cause bleeding.   Do not drive when taking pain medication.   Follow your caregiver's advice regarding diet, exercise, lifting, driving, and general activities.   You  may resume your usual diet as directed and allowed.   Get plenty of rest and sleep.   Do not douche, use tampons, or have sexual intercourse until your caregiver says it is okay.   Change your bandages (dressings) as directed.   Take your temperature twice a day and write it down.   Your caregiver may recommend showers instead of baths for a few weeks.   Do not drink alcohol until your caregiver says it is okay.   If you develop constipation, you may take a mild laxative with your caregiver's permission. Bran foods and drinking fluids helps with constipation problems.   Try to have someone home with you for a week or two to help with the household activities.   Make sure you and your family understands everything about your operation and recovery.   Do not sign any legal documents until you feel normal again.  Keep all your follow-up appointments.  SEEK MEDICAL CARE IF:   There is swelling, redness, or increasing pain in the wound area.   Pus is coming from the wound.   You notice a bad smell from the wound or surgical dressing.   You have pain, redness, or swelling from the intravenous site.   The wound is breaking open (the edges are not staying together).   You feel dizzy or feel like fainting.   You develop pain or bleeding when you urinate.   You develop diarrhea.   You develop nausea and vomiting.   You develop abnormal vaginal discharge.   You develop a rash.   You have any type of abnormal reaction or develop an allergy to your medication.   You need stronger pain medication for your pain.  SEEK IMMEDIATE MEDICAL CARE IF:   You develop an unexplained temperature above 100 F (37.8 C).   You develop abdominal pain.   You develop chest pain.   You develop shortness of breath.   You pass out.   You develop pain, swelling, or redness of your leg.   You develop heavy vaginal bleeding with or without blood clots.  Document Released: 08/27/2005  Document Revised: 08/16/2011 Document Reviewed: 01/21/2009 [facility] Patient Information 2012 [place], [place].

## 2012-01-24 ENCOUNTER — Encounter (HOSPITAL_COMMUNITY): Payer: Self-pay

## 2012-01-24 ENCOUNTER — Encounter (HOSPITAL_COMMUNITY)
Admission: RE | Admit: 2012-01-24 | Discharge: 2012-01-24 | Disposition: A | Discharge status: Home / Self Care | Payer: Medicare Other | Source: Ambulatory Visit | Attending: Obstetrics & Gynecology | Admitting: Obstetrics & Gynecology

## 2012-01-24 HISTORY — DX: Encounter for other specified aftercare: Z51.89

## 2012-01-24 HISTORY — DX: Headache: R51

## 2012-01-24 HISTORY — DX: Reserved for inherently not codable concepts without codable children: IMO0001

## 2012-01-24 HISTORY — DX: Personal history of other venous thrombosis and embolism: Z86.718

## 2012-01-24 LAB — COMPREHENSIVE METABOLIC PANEL
AST: 26 U/L (ref 0–37)
Albumin: 3.6 g/dL (ref 3.5–5.2)
BUN: 11 mg/dL (ref 6–23)
Calcium: 9.5 mg/dL (ref 8.4–10.5)
Creatinine, Ser: 0.78 mg/dL (ref 0.50–1.10)
Total Bilirubin: 0.2 mg/dL — ABNORMAL LOW (ref 0.3–1.2)

## 2012-01-24 LAB — CBC
HCT: 39.6 % (ref 36.0–46.0)
MCH: 28.4 pg (ref 26.0–34.0)
MCHC: 33.3 g/dL (ref 30.0–36.0)
MCV: 85.3 fL (ref 78.0–100.0)
Platelets: 298 10*3/uL (ref 150–400)
RDW: 12.4 % (ref 11.5–15.5)

## 2012-01-24 LAB — URINALYSIS, ROUTINE W REFLEX MICROSCOPIC
Glucose, UA: NEGATIVE mg/dL
Ketones, ur: NEGATIVE mg/dL
Nitrite: NEGATIVE
pH: 7 (ref 5.0–8.0)

## 2012-01-24 LAB — HCG, QUANTITATIVE, PREGNANCY: hCG, Beta Chain, Quant, S: 2 m[IU]/mL (ref <5)

## 2012-01-30 ENCOUNTER — Encounter (HOSPITAL_COMMUNITY)
Admission: RE | Disposition: A | Discharge status: Home / Self Care | Payer: Self-pay | Source: Ambulatory Visit | Attending: Obstetrics & Gynecology

## 2012-01-30 ENCOUNTER — Encounter (HOSPITAL_COMMUNITY): Payer: Self-pay | Admitting: *Deleted

## 2012-01-30 ENCOUNTER — Ambulatory Visit (HOSPITAL_COMMUNITY)
Admission: RE | Admit: 2012-01-30 | Discharge: 2012-01-30 | Disposition: A | Discharge status: Home / Self Care | Payer: Medicare Other | Source: Ambulatory Visit | Attending: Obstetrics & Gynecology | Admitting: Obstetrics & Gynecology

## 2012-01-30 ENCOUNTER — Encounter (HOSPITAL_COMMUNITY): Payer: Self-pay | Admitting: Anesthesiology

## 2012-01-30 ENCOUNTER — Ambulatory Visit (HOSPITAL_COMMUNITY): Payer: Medicare Other | Admitting: Anesthesiology

## 2012-01-30 DIAGNOSIS — Z01812 Encounter for preprocedural laboratory examination: Secondary | ICD-10-CM | POA: Insufficient documentation

## 2012-01-30 DIAGNOSIS — Z794 Long term (current) use of insulin: Secondary | ICD-10-CM | POA: Insufficient documentation

## 2012-01-30 DIAGNOSIS — R1900 Intra-abdominal and pelvic swelling, mass and lump, unspecified site: Secondary | ICD-10-CM

## 2012-01-30 DIAGNOSIS — E119 Type 2 diabetes mellitus without complications: Secondary | ICD-10-CM | POA: Insufficient documentation

## 2012-01-30 DIAGNOSIS — D279 Benign neoplasm of unspecified ovary: Secondary | ICD-10-CM | POA: Insufficient documentation

## 2012-01-30 SURGERY — SALPINGO-OOPHORECTOMY, LAPAROSCOPIC
Anesthesia: General | Laterality: Bilateral | Wound class: Clean

## 2012-01-30 MED ORDER — CEFAZOLIN SODIUM-DEXTROSE 2-3 GM-% IV SOLR: 2.0000 g | Freq: Once | INTRAVENOUS | Status: DC

## 2012-01-30 MED ORDER — 0.9 % SODIUM CHLORIDE (POUR BTL) OPTIME
TOPICAL | Status: DC | PRN
  Administered 2012-01-30: 1000 mL

## 2012-01-30 MED ORDER — FENTANYL CITRATE 0.05 MG/ML IJ SOLN
INTRAMUSCULAR | Status: AC
  Administered 2012-01-30: 50 ug via INTRAVENOUS
  Filled 2012-01-30: qty 5

## 2012-01-30 MED ORDER — GLYCOPYRROLATE 0.2 MG/ML IJ SOLN
INTRAMUSCULAR | Status: AC
  Administered 2012-01-30: 0.2 mg via INTRAVENOUS
  Filled 2012-01-30: qty 1

## 2012-01-30 MED ORDER — GLYCOPYRROLATE 0.2 MG/ML IJ SOLN
INTRAMUSCULAR | Status: AC
  Filled 2012-01-30: qty 3

## 2012-01-30 MED ORDER — KETOROLAC TROMETHAMINE 10 MG PO TABS: 10.0000 mg | ORAL_TABLET | Freq: Three times a day (TID) | ORAL | Status: AC | PRN

## 2012-01-30 MED ORDER — CEFAZOLIN SODIUM 1-5 GM-% IV SOLN
INTRAVENOUS | Status: DC | PRN
  Administered 2012-01-30: 2 g via INTRAVENOUS

## 2012-01-30 MED ORDER — ROCURONIUM BROMIDE 50 MG/5ML IV SOLN
INTRAVENOUS | Status: DC
Start: 2012-01-30 — End: 2012-01-30
  Filled 2012-01-30: qty 1

## 2012-01-30 MED ORDER — NEOSTIGMINE METHYLSULFATE 1 MG/ML IJ SOLN
INTRAMUSCULAR | Status: DC | PRN
  Administered 2012-01-30: 4 mg via INTRAVENOUS

## 2012-01-30 MED ORDER — HYDROCODONE-ACETAMINOPHEN 5-500 MG PO TABS: 1.0000 | ORAL_TABLET | Freq: Four times a day (QID) | ORAL | Status: AC | PRN

## 2012-01-30 MED ORDER — LIDOCAINE HCL 1 % IJ SOLN
INTRAMUSCULAR | Status: DC | PRN
  Administered 2012-01-30: 40 mg via INTRADERMAL

## 2012-01-30 MED ORDER — KETOROLAC TROMETHAMINE 30 MG/ML IJ SOLN
INTRAMUSCULAR | Status: AC
  Administered 2012-01-30: 30 mg via INTRAVENOUS
  Filled 2012-01-30: qty 1

## 2012-01-30 MED ORDER — CEFAZOLIN SODIUM 1-5 GM-% IV SOLN: 1.0000 g | INTRAVENOUS | Status: DC

## 2012-01-30 MED ORDER — DEXAMETHASONE SODIUM PHOSPHATE 4 MG/ML IJ SOLN
4.0000 mg | Freq: Once | INTRAMUSCULAR | Status: AC
  Administered 2012-01-30: 4 mg via INTRAVENOUS

## 2012-01-30 MED ORDER — ROCURONIUM BROMIDE 100 MG/10ML IV SOLN
INTRAVENOUS | Status: DC | PRN
  Administered 2012-01-30: 10 mg via INTRAVENOUS
  Administered 2012-01-30: 5 mg via INTRAVENOUS
  Administered 2012-01-30: 25 mg via INTRAVENOUS
  Administered 2012-01-30: 10 mg via INTRAVENOUS

## 2012-01-30 MED ORDER — MIDAZOLAM HCL 2 MG/2ML IJ SOLN
INTRAMUSCULAR | Status: AC
  Administered 2012-01-30: 2 mg via INTRAVENOUS
  Filled 2012-01-30: qty 2

## 2012-01-30 MED ORDER — ONDANSETRON HCL 4 MG/2ML IJ SOLN: 4.0000 mg | Freq: Once | INTRAMUSCULAR | Status: DC | PRN

## 2012-01-30 MED ORDER — ONDANSETRON HCL 4 MG/2ML IJ SOLN
4.0000 mg | Freq: Once | INTRAMUSCULAR | Status: AC
  Administered 2012-01-30: 4 mg via INTRAVENOUS

## 2012-01-30 MED ORDER — MIDAZOLAM HCL 2 MG/2ML IJ SOLN
1.0000 mg | INTRAMUSCULAR | Status: DC | PRN
  Administered 2012-01-30 (×2): 2 mg via INTRAVENOUS

## 2012-01-30 MED ORDER — FENTANYL CITRATE 0.05 MG/ML IJ SOLN
25.0000 ug | INTRAMUSCULAR | Status: DC | PRN
  Administered 2012-01-30 (×2): 50 ug via INTRAVENOUS

## 2012-01-30 MED ORDER — FENTANYL CITRATE 0.05 MG/ML IJ SOLN
INTRAMUSCULAR | Status: AC
  Administered 2012-01-30: 50 ug via INTRAVENOUS
  Filled 2012-01-30: qty 2

## 2012-01-30 MED ORDER — GLYCOPYRROLATE 0.2 MG/ML IJ SOLN
INTRAMUSCULAR | Status: DC | PRN
  Administered 2012-01-30: .7 mg via INTRAVENOUS

## 2012-01-30 MED ORDER — PROPOFOL 10 MG/ML IV EMUL
INTRAVENOUS | Status: AC
  Filled 2012-01-30: qty 20

## 2012-01-30 MED ORDER — GLYCOPYRROLATE 0.2 MG/ML IJ SOLN
0.2000 mg | Freq: Once | INTRAMUSCULAR | Status: AC
  Administered 2012-01-30: 0.2 mg via INTRAVENOUS

## 2012-01-30 MED ORDER — ONDANSETRON HCL 8 MG PO TABS: 8.0000 mg | ORAL_TABLET | Freq: Three times a day (TID) | ORAL | Status: AC | PRN

## 2012-01-30 MED ORDER — PROPOFOL 10 MG/ML IV BOLUS
INTRAVENOUS | Status: DC | PRN
  Administered 2012-01-30: 150 mg via INTRAVENOUS

## 2012-01-30 MED ORDER — SUCCINYLCHOLINE CHLORIDE 20 MG/ML IJ SOLN
INTRAMUSCULAR | Status: DC | PRN
  Administered 2012-01-30: 120 mg via INTRAVENOUS

## 2012-01-30 MED ORDER — KETOROLAC TROMETHAMINE 30 MG/ML IJ SOLN
30.0000 mg | Freq: Once | INTRAMUSCULAR | Status: AC
  Administered 2012-01-30: 30 mg via INTRAVENOUS

## 2012-01-30 MED ORDER — ONDANSETRON HCL 4 MG/2ML IJ SOLN
INTRAMUSCULAR | Status: AC
  Administered 2012-01-30: 4 mg via INTRAVENOUS
  Filled 2012-01-30: qty 2

## 2012-01-30 MED ORDER — FENTANYL CITRATE 0.05 MG/ML IJ SOLN
INTRAMUSCULAR | Status: DC | PRN
  Administered 2012-01-30 (×5): 50 ug via INTRAVENOUS

## 2012-01-30 MED ORDER — LACTATED RINGERS IV SOLN
INTRAVENOUS | Status: DC
  Administered 2012-01-30: 1000 mL via INTRAVENOUS
  Administered 2012-01-30: 11:00:00 via INTRAVENOUS

## 2012-01-30 MED ORDER — SUCCINYLCHOLINE CHLORIDE 20 MG/ML IJ SOLN
INTRAMUSCULAR | Status: AC
  Filled 2012-01-30: qty 1

## 2012-01-30 MED ORDER — CEFAZOLIN SODIUM-DEXTROSE 2-3 GM-% IV SOLR
INTRAVENOUS | Status: AC
  Filled 2012-01-30: qty 50

## 2012-01-30 MED ORDER — MIDAZOLAM HCL 2 MG/2ML IJ SOLN
INTRAMUSCULAR | Status: AC
  Filled 2012-01-30: qty 2

## 2012-01-30 MED ORDER — LIDOCAINE HCL (PF) 1 % IJ SOLN
INTRAMUSCULAR | Status: AC
  Filled 2012-01-30: qty 5

## 2012-01-30 MED ORDER — DEXAMETHASONE SODIUM PHOSPHATE 4 MG/ML IJ SOLN
INTRAMUSCULAR | Status: AC
  Administered 2012-01-30: 4 mg via INTRAVENOUS
  Filled 2012-01-30: qty 1

## 2012-01-30 SURGICAL SUPPLY — 37 items
APPLIER CLIP UNV 5X34 EPIX (ENDOMECHANICALS) ×1 IMPLANT
APR XCLPCLP 20M/L UNV 34X5 (ENDOMECHANICALS) ×1
BAG HAMPER (MISCELLANEOUS) ×2 IMPLANT
BAG SPEC RTRVL LRG 6X4 10 (ENDOMECHANICALS) ×2
BLADE SURG SZ11 CARB STEEL (BLADE) ×2 IMPLANT
CLOTH BEACON ORANGE TIMEOUT ST (SAFETY) ×2 IMPLANT
COVER LIGHT HANDLE STERIS (MISCELLANEOUS) ×4 IMPLANT
DRAPE PROXIMA HALF (DRAPES) ×1 IMPLANT
ELECT REM PT RETURN 9FT ADLT (ELECTROSURGICAL) ×2
ELECTRODE REM PT RTRN 9FT ADLT (ELECTROSURGICAL) ×1 IMPLANT
FORMALIN 10 PREFIL 120ML (MISCELLANEOUS) ×1 IMPLANT
FORMALIN 10 PREFIL 480ML (MISCELLANEOUS) ×2 IMPLANT
GLOVE BIOGEL PI IND STRL 7.0 (GLOVE) IMPLANT
GLOVE BIOGEL PI IND STRL 8 (GLOVE) ×1 IMPLANT
GLOVE BIOGEL PI INDICATOR 7.0 (GLOVE) ×1
GLOVE BIOGEL PI INDICATOR 8 (GLOVE) ×1
GLOVE ECLIPSE 8.0 STRL XLNG CF (GLOVE) ×2 IMPLANT
GLOVE EXAM NITRILE PF MED BLUE (GLOVE) ×2 IMPLANT
GLOVE SS BIOGEL STRL SZ 6.5 (GLOVE) IMPLANT
GLOVE SUPERSENSE BIOGEL SZ 6.5 (GLOVE) ×1
INST SET LAPROSCOPIC GYN AP (KITS) ×2 IMPLANT
KIT ROOM TURNOVER APOR (KITS) ×2 IMPLANT
KIT TROCAR LAP GYN (TROCAR) ×2 IMPLANT
MANIFOLD NEPTUNE II (INSTRUMENTS) ×2 IMPLANT
PACK PERI GYN (CUSTOM PROCEDURE TRAY) ×2 IMPLANT
PAD ARMBOARD 7.5X6 YLW CONV (MISCELLANEOUS) ×2 IMPLANT
POUCH SPECIMEN RETRIEVAL 10MM (ENDOMECHANICALS) ×2 IMPLANT
SCALPEL HARMONIC ACE (MISCELLANEOUS) ×2 IMPLANT
SET BASIN LINEN APH (SET/KITS/TRAYS/PACK) ×2 IMPLANT
SOLUTION ANTI FOG 6CC (MISCELLANEOUS) ×2 IMPLANT
SPONGE GAUZE 2X2 8PLY STRL LF (GAUZE/BANDAGES/DRESSINGS) ×1 IMPLANT
STAPLER VISISTAT 35W (STAPLE) ×2 IMPLANT
SUT VICRYL 0 UR6 27IN ABS (SUTURE) ×3 IMPLANT
SYR 5ML LL (SYRINGE) ×1 IMPLANT
TAPE CLOTH SURG 4X10 WHT LF (GAUZE/BANDAGES/DRESSINGS) ×1 IMPLANT
TRAY FOLEY CATH 14FR (SET/KITS/TRAYS/PACK) ×2 IMPLANT
WARMER LAPAROSCOPE (MISCELLANEOUS) ×2 IMPLANT

## 2012-01-30 NOTE — Discharge Instructions (Signed)
Laparoscopic Ovarian Torsion Surgery Care After Refer to this sheet in the next few weeks. These instructions provide you with information on caring for yourself after your procedure. Your caregiver may also give you more specific instructions. Your treatment has been planned according to current medical practices, but problems sometimes occur. Call your caregiver if you have any problems or questions after your procedure. HOME CARE INSTRUCTIONS  Take any medicine as directed by your caregiver. Follow the directions carefully.   Check your incisions every day.   Keep the incision area(s) dry. Ask your caregiver when it is safe to shower or bathe again.   Rest as much as possible for the next 3 days. Ask your caregiver when it is safe to go back to your normal activities.   Drink enough fluids to keep your urine clear or pale yellow.   Keep all follow-up appointments. Your caregiver will make sure you are healing the way you should be.  SEEK MEDICAL CARE IF:   You have bleeding or discharge from your vagina.   You have pain in your abdomen.   You feel nauseous.  SEEK IMMEDIATE MEDICAL CARE IF:   Your incision(s) becomes red, swollen, or tender.   Your incision(s) start(s) bleeding.   You have pus coming from any incision.   You have heavy or persistent vaginal bleeding or discharge.   You have severe or increased abdominal pain.   You cannot stop vomiting.   Your nausea will not go away.   You have a fever.  MAKE SURE YOU:  Understand these instructions.   Watch your condition.   Get help right away if you are not doing well or get worse.  Document Released: 08/16/2011 Document Reviewed: 08/14/2011 [employer] Patient Information 2012 [employer]. 

## 2012-01-30 NOTE — Op Note (Signed)
Preoperative diagnosis:  1.  Complex left ovarian mass, mostly cystic                                         2.  normal preoperative CA 125  Postoperative diagnosis:  Same as above  Procedure:  Laparoscopic bilateral salpingo-oophorectomy  Surgeon:  Rockne Coons MD  Anesthesia:  Gen. Endotracheal  Findings:  Patient was evaluated for abdominal and pelvic pain.  She had a CT scan which revealed bilateral cystic ovarian masses left larger than right.  Ca 125 was normal.  No other evidence of it being a malignancy.  Intra operativley,  The pre operative findings were confirmed.  There was no other peritoneal disease.  There was no intra operative spillage of the cystic contents from either side.  Description of operation:  The patient was taken to the operating room and placed in the supine position where she underwent general endotracheal anesthesia.  She was placed in the low lithotomy position.  She was prepped and draped in the usual sterile fashion and a Foley catheter was placed.  An incision was made in the umbilicus and a Veres needle was placed into the peritoneal cavity with one pass without difficulty.  The peritoneal cavity was then insufflated.  An 11 mm non-bladed direct visualization trocars were then used and placed into the peritoneal cavity with direct laparoscopic visualization again without difficulty.  Incisions were then made in the left lower quadrant and supra pubic area in the midline.   5 mm trocar was placed in the left lower quadrant and a 5 mm trocar was place in the supra pubic area.  Both were done under direct visualization without difficulty.  The right ovary was grasped.  The harmonic scalpel was used and the infundibulopelvic ligament on the right was coagulated and then transected.  He remaining peritoneal attachments of the right ovary were also taken down hemostatically.  The left ovary was identified and again the infundibulopelvic ligament on the left was  coagulated and transected.  The left ovary was removed hemostatically as well.  An Endo Catch was placed in the right ovary was put in the bag and removed through the left lower quadrant incision without difficulty.  He left ovary was also removed without difficulty.  The subcutaneous tissue was then reapproximated with 0 Vicryl. The skin was closed using skin staples.  The umbilical fascia was closed with a single 0 Vicryl suture.  The subcutaneous tissue was reapproximated loosely using 0 Vicryl.  The skin was closed using skin staples.  Michiko Lineman H 01/30/2012 11:22 AM   The patient was awakened from anesthesia and taken to the recovery room in good stable condition with all counts being correct x3.  The estimated blood loss for the procedure is 100 cc almost all of which came from the superficial bleeder in the right lower quadrant.  There was no bleeding from the intraperitoneal surgery at all.  The patient received ciprofloxacin and Cleocin preoperatively.  She also received Toradol IV preoperatively.  She will be discharged from the recovery room time and seen next week for staple and suture removal.  Marquisha Nikolov H 07/25/2011 8:50 AM

## 2012-01-30 NOTE — Anesthesia Procedure Notes (Signed)
Procedure Name: Intubation Date/Time: 01/30/2012 10:22 AM Performed by: Glynn Octave E Pre-anesthesia Checklist: Patient identified, Patient being monitored, Timeout performed, Emergency Drugs available and Suction available Patient Re-evaluated:Patient Re-evaluated prior to inductionOxygen Delivery Method: Circle System Utilized Preoxygenation: Pre-oxygenation with 100% oxygen Intubation Type: IV induction, Rapid sequence and Cricoid Pressure applied Ventilation: Mask ventilation without difficulty Grade View: Grade I Tube type: Oral Tube size: 7.0 mm Number of attempts: 1 Airway Equipment and Method: stylet,  Stylet and Video-laryngoscopy Placement Confirmation: ETT inserted through vocal cords under direct vision,  positive ETCO2 and breath sounds checked- equal and bilateral Secured at: 21 cm Tube secured with: Tape Dental Injury: Teeth and Oropharynx as per pre-operative assessment

## 2012-01-30 NOTE — Transfer of Care (Signed)
Immediate Anesthesia Transfer of Care Note  Patient: Cheryl [name]  Procedure(s) Performed: Procedure(s) (LRB): LAPAROSCOPIC SALPINGO OOPHERECTOMY (Bilateral)  Patient Location: PACU  Anesthesia Type: General  Level of Consciousness: awake, alert  and oriented  Airway & Oxygen Therapy: Patient Spontanous Breathing and Patient connected to face mask oxygen  Post-op Assessment: Report given to PACU RN and Post -op Vital signs reviewed and stable  Post vital signs: Reviewed and stable  Complications: No apparent anesthesia complications

## 2012-01-30 NOTE — Anesthesia Preprocedure Evaluation (Signed)
Anesthesia Evaluation  Patient identified by MRN, date of birth, ID band Patient awake    Reviewed: Allergy & Precautions, H&P , NPO status , Patient's Chart, lab work & pertinent test results  Airway Mallampati: III TM Distance: <3 FB   Mouth opening: Limited Mouth Opening  Dental  (+) Teeth Intact   Pulmonary shortness of breath and with exertion, pneumonia , Recent URI  (pneumonia [id number]...resolved now), Resolved,  breath sounds clear to auscultation        Cardiovascular negative cardio ROS  Rhythm:Regular Rate:Normal     Neuro/Psych  Headaches, PSYCHIATRIC DISORDERS Bipolar Disorder Schizophrenia    GI/Hepatic   Endo/Other  Diabetes mellitus-, Well Controlled, Type 2, Oral Hypoglycemic AgentsHypothyroidism   Renal/GU      Musculoskeletal   Abdominal   Peds  Hematology   Anesthesia Other Findings   Reproductive/Obstetrics                           Anesthesia Physical Anesthesia Plan  ASA: III  Anesthesia Plan: General   Post-op Pain Management:    Induction: Intravenous, Rapid sequence and Cricoid pressure planned  Airway Management Planned: Oral ETT and Video Laryngoscope Planned  Additional Equipment:   Intra-op Plan:   Post-operative Plan: Extubation in OR  Informed Consent:   Plan Discussed with:   Anesthesia Plan Comments:         Anesthesia Quick Evaluation

## 2012-01-30 NOTE — Anesthesia Postprocedure Evaluation (Signed)
  Anesthesia Post-op Note  Patient: Cheryl [name]  Procedure(s) Performed: Procedure(s) (LRB): LAPAROSCOPIC SALPINGO OOPHERECTOMY (Bilateral)  Patient Location: PACU  Anesthesia Type: General  Level of Consciousness: awake  Airway and Oxygen Therapy: Patient Spontanous Breathing and Patient connected to face mask oxygen  Post-op Pain: none  Post-op Assessment: Post-op Vital signs reviewed, Patient's Cardiovascular Status Stable, Respiratory Function Stable, Patent Airway and No signs of Nausea or vomiting  Post-op Vital Signs: Reviewed and stable  Complications: No apparent anesthesia complications

## 2012-01-30 NOTE — H&P (Signed)
Cheryl [name] is an 56 y.o. female who presented to the ER facility in [place], [place] for evaluation of pelvic pain, N/V.  She had a CT scan which revealed bilateral cystic pelvic masses but the remainder of the scan was normal.  CA 125 is normal. For laparoscopic evaluation and removal of both ovaries.     Past Medical History  Diagnosis Date  . Thyroid disease   . Diabetes mellitus   . Depression   . Schizophrenia   . Bipolar affective   . Suicidal ideation   . Anxiety   . Hypothyroidism   . Recurrent upper respiratory infection (URI)   . Pneumonia     11/2011  . Arthritis     back, hands  . Shortness of breath     with exertion  . Headache   . Blood transfusion   . History of DVT of lower extremity     left, after pregnancy    Past Surgical History  Procedure Date  . Appendectomy   . Tubal ligation   . Esophagogastroduodenoscopy 11/23/2011    Procedure: ESOPHAGOGASTRODUODENOSCOPY (EGD);  Surgeon: Freddy Jaksch, MD;  Location: [facility];  Service: Endoscopy;  Laterality: N/A;    Family History  Problem Relation Age of Onset  . Diabetes Mother   . Lung cancer Father   . Heart failure Father     Social History:  reports that she has never smoked. She has never used smokeless tobacco. She reports that she does not drink alcohol or use illicit drugs.  Allergies:  Allergies  Allergen Reactions  . Sulfa Antibiotics Rash    Prescriptions prior to admission  Medication Sig Dispense Refill  . ALPRAZolam (XANAX) 0.5 MG tablet Take 0.5 mg by mouth 3 (three) times daily as needed. For anxiety      . docusate sodium (COLACE) 100 MG capsule Take 200 mg by mouth 2 (two) times daily.       Marland Kitchen gabapentin (NEURONTIN) 600 MG tablet Take 600 mg by mouth 4 (four) times daily.       Marland Kitchen HYDROcodone-acetaminophen (VICODIN) 5-500 MG per tablet Take 1 tablet by mouth 3 (three) times daily as needed. For pain      . insulin NPH-insulin regular (NOVOLIN 70/30) (70-30) 100 UNIT/ML  injection Inject 20-26 Units into the skin 2 (two) times daily. Take 26 units at 7am and 20 units and 7pm      . levothyroxine (SYNTHROID, LEVOTHROID) 150 MCG tablet Take 150 mcg by mouth daily before breakfast.       . loratadine (CLARITIN) 10 MG tablet Take 10 mg by mouth every morning.       . metFORMIN (GLUCOPHAGE) 500 MG tablet Take 500 mg by mouth 2 (two) times daily with a meal.      . Multiple Vitamin (MULITIVITAMIN WITH MINERALS) TABS Take 1 tablet by mouth every morning.      . traZODone (DESYREL) 100 MG tablet Take 100 mg by mouth at bedtime.          ROS  Review of Systems  Constitutional: Negative for fever, chills, weight loss, malaise/fatigue and diaphoresis.  HENT: Negative for hearing loss, ear pain, nosebleeds, congestion, sore throat, neck pain, tinnitus and ear discharge.   Eyes: Negative for blurred vision, double vision, photophobia, pain, discharge and redness.  Respiratory: Negative for cough, hemoptysis, sputum production, shortness of breath, wheezing and stridor.   Cardiovascular: Negative for chest pain, palpitations, orthopnea, claudication, leg swelling and PND.  Gastrointestinal: Positive  for abdominal pain. Negative for heartburn, nausea, vomiting, diarrhea, constipation, blood in stool and melena.  Genitourinary: Negative for dysuria, urgency, frequency, hematuria and flank pain.  Musculoskeletal: Negative for myalgias, back pain, joint pain and falls.  Skin: Negative for itching and rash.  Neurological: Negative for dizziness, tingling, tremors, sensory change, speech change, focal weakness, seizures, loss of consciousness, weakness and headaches.  Endo/Heme/Allergies: Negative for environmental allergies and polydipsia. Does not bruise/bleed easily.  Psychiatric/Behavioral: Negative for depression, suicidal ideas, hallucinations, memory loss and substance abuse. The patient is not nervous/anxious and does not have insomnia.      Blood pressure 119/72,  pulse 97, temperature 98 F (36.7 C), temperature source Oral, resp. rate 20, SpO2 95.00%. Physical Exam Physical Exam  Vitals reviewed. Constitutional: She is oriented to person, place, and time. She appears well-developed and well-nourished.  HENT:  Head: Normocephalic and atraumatic.  Right Ear: External ear normal.  Left Ear: External ear normal.  Nose: Nose normal.  Mouth/Throat: Oropharynx is clear and moist.  Eyes: Conjunctivae and EOM are normal. Pupils are equal, round, and reactive to light. Right eye exhibits no discharge. Left eye exhibits no discharge. No scleral icterus.  Neck: Normal range of motion. Neck supple. No tracheal deviation present. No thyromegaly present.  Cardiovascular: Normal rate, regular rhythm, normal heart sounds and intact distal pulses.  Exam reveals no gallop and no friction rub.   No murmur heard. Respiratory: Effort normal and breath sounds normal. No respiratory distress. She has no wheezes. She has no rales. She exhibits no tenderness.  GI: Soft. Bowel sounds are normal. She exhibits no distension and no mass. There is tenderness. There is no rebound and no guarding.  Genitourinary:       Vulva is normal without lesions Vagina is pink moist without discharge Cervix normal in appearance and pap is normal Uterus is normal Adnexa is significant for 5.9 cm cystic mass on leeft ovary and 4 cm cystic mass on right.   Musculoskeletal: Normal range of motion. She exhibits no edema and no tenderness.  Neurological: She is alert and oriented to person, place, and time. She has normal reflexes. She displays normal reflexes. No cranial nerve deficit. She exhibits normal muscle tone. Coordination normal.  Skin: Skin is warm and dry. No rash noted. No erythema. No pallor.  Psychiatric: She has a normal mood and affect. Her behavior is normal. Judgment and thought content normal.   Recent Results (from the past 336 hour(s))  SURGICAL PCR SCREEN   Collection  Time   01/24/12 10:56 AM      Component Value Range   MRSA, PCR NEGATIVE  NEGATIVE    Staphylococcus aureus NEGATIVE  NEGATIVE   URINALYSIS, ROUTINE W REFLEX MICROSCOPIC   Collection Time   01/24/12 10:57 AM      Component Value Range   Color, Urine YELLOW  YELLOW    APPearance CLEAR  CLEAR    Specific Gravity, Urine 1.020  1.005 - 1.030    pH 7.0  5.0 - 8.0    Glucose, UA NEGATIVE  NEGATIVE (mg/dL)   Hgb urine dipstick NEGATIVE  NEGATIVE    Bilirubin Urine NEGATIVE  NEGATIVE    Ketones, ur NEGATIVE  NEGATIVE (mg/dL)   Protein, ur TRACE (*) NEGATIVE (mg/dL)   Urobilinogen, UA 0.2  0.0 - 1.0 (mg/dL)   Nitrite NEGATIVE  NEGATIVE    Leukocytes, UA SMALL (*) NEGATIVE   URINE MICROSCOPIC-ADD ON   Collection Time   01/24/12 10:57 AM  Component Value Range   WBC, UA 3-6  <3 (WBC/hpf)  CBC   Collection Time   01/24/12 11:30 AM      Component Value Range   WBC 7.8  4.0 - 10.5 (K/uL)   RBC 4.64  3.87 - 5.11 (MIL/uL)   Hemoglobin 13.2  12.0 - 15.0 (g/dL)   HCT 16.1  09.6 - 04.5 (%)   MCV 85.3  78.0 - 100.0 (fL)   MCH 28.4  26.0 - 34.0 (pg)   MCHC 33.3  30.0 - 36.0 (g/dL)   RDW 40.9  81.1 - 91.4 (%)   Platelets 298  150 - 400 (K/uL)  COMPREHENSIVE METABOLIC PANEL   Collection Time   01/24/12 11:30 AM      Component Value Range   Sodium 139  135 - 145 (mEq/L)   Potassium 4.5  3.5 - 5.1 (mEq/L)   Chloride 101  96 - 112 (mEq/L)   CO2 28  19 - 32 (mEq/L)   Glucose, Bld 154 (*) 70 - 99 (mg/dL)   BUN 11  6 - 23 (mg/dL)   Creatinine, Ser 7.82  0.50 - 1.10 (mg/dL)   Calcium 9.5  8.4 - 95.6 (mg/dL)   Total Protein 7.0  6.0 - 8.3 (g/dL)   Albumin 3.6  3.5 - 5.2 (g/dL)   AST 26  0 - 37 (U/L)   ALT 38 (*) 0 - 35 (U/L)   Alkaline Phosphatase 86  39 - 117 (U/L)   Total Bilirubin 0.2 (*) 0.3 - 1.2 (mg/dL)   GFR calc non Af Amer >90  >90 (mL/min)   GFR calc Af Amer >90  >90 (mL/min)  HCG, QUANTITATIVE, PREGNANCY   Collection Time   01/24/12 11:30 AM      Component Value Range   hCG,  Beta Chain, Quant, S 2  <5 (mIU/mL)  GLUCOSE, CAPILLARY   Collection Time   01/30/12  9:08 AM      Component Value Range   Glucose-Capillary 142 (*) 70 - 99 (mg/dL)     Results for orders placed during the hospital encounter of 01/30/12 (from the past 24 hour(s))  GLUCOSE, CAPILLARY     Status: Abnormal   Collection Time   01/30/12  9:08 AM      Component Value Range   Glucose-Capillary 142 (*) 70 - 99 (mg/dL)      Assessment/Plan: 1.  Bilateral cystic ovarian masses 2.  Normal CA 125 3.  Pelvic pain  Pt understands the risks of surgery including but not limited t  excessive bleeding requiring transfusion or reoperation, post-operative infection requiring prolonged hospitalization or re-hospitalization and antibiotic therapy, and damage to other organs including bladder, bowel, ureters and major vessels.  The patient also understands the alternative treatment options which were discussed in full.  All questions were answered.   Mendy Lapinsky H 01/30/2012, 9:49 AM

## 2012-09-20 ENCOUNTER — Inpatient Hospital Stay (HOSPITAL_COMMUNITY)
Admission: EM | Admit: 2012-09-20 | Discharge: 2012-09-27 | DRG: 203 | Disposition: A | Discharge status: Home / Self Care | Payer: Medicare Other | Attending: Internal Medicine | Admitting: Internal Medicine

## 2012-09-20 ENCOUNTER — Encounter (HOSPITAL_COMMUNITY): Payer: Self-pay | Admitting: Emergency Medicine

## 2012-09-20 ENCOUNTER — Emergency Department (HOSPITAL_COMMUNITY): Payer: Medicare Other

## 2012-09-20 DIAGNOSIS — R0602 Shortness of breath: Secondary | ICD-10-CM

## 2012-09-20 DIAGNOSIS — R5381 Other malaise: Secondary | ICD-10-CM | POA: Diagnosis present

## 2012-09-20 DIAGNOSIS — J209 Acute bronchitis, unspecified: Principal | ICD-10-CM | POA: Diagnosis present

## 2012-09-20 DIAGNOSIS — E860 Dehydration: Secondary | ICD-10-CM

## 2012-09-20 DIAGNOSIS — R42 Dizziness and giddiness: Secondary | ICD-10-CM | POA: Diagnosis not present

## 2012-09-20 DIAGNOSIS — R531 Weakness: Secondary | ICD-10-CM

## 2012-09-20 DIAGNOSIS — E039 Hypothyroidism, unspecified: Secondary | ICD-10-CM | POA: Diagnosis present

## 2012-09-20 DIAGNOSIS — R911 Solitary pulmonary nodule: Secondary | ICD-10-CM | POA: Diagnosis present

## 2012-09-20 DIAGNOSIS — M549 Dorsalgia, unspecified: Secondary | ICD-10-CM

## 2012-09-20 DIAGNOSIS — K219 Gastro-esophageal reflux disease without esophagitis: Secondary | ICD-10-CM

## 2012-09-20 DIAGNOSIS — F319 Bipolar disorder, unspecified: Secondary | ICD-10-CM | POA: Diagnosis present

## 2012-09-20 DIAGNOSIS — G8929 Other chronic pain: Secondary | ICD-10-CM

## 2012-09-20 DIAGNOSIS — R0902 Hypoxemia: Secondary | ICD-10-CM | POA: Diagnosis present

## 2012-09-20 DIAGNOSIS — E119 Type 2 diabetes mellitus without complications: Secondary | ICD-10-CM | POA: Diagnosis present

## 2012-09-20 LAB — CBC WITH DIFFERENTIAL/PLATELET
HCT: 36.2 % (ref 36.0–46.0)
Hemoglobin: 12.3 g/dL (ref 12.0–15.0)
Lymphocytes Relative: 27 % (ref 12–46)
Monocytes Absolute: 0.8 10*3/uL (ref 0.1–1.0)
Monocytes Relative: 10 % (ref 3–12)
Neutro Abs: 4.3 10*3/uL (ref 1.7–7.7)
RBC: 4.23 MIL/uL (ref 3.87–5.11)
WBC: 7.7 10*3/uL (ref 4.0–10.5)

## 2012-09-20 LAB — COMPREHENSIVE METABOLIC PANEL
AST: 34 U/L (ref 0–37)
Alkaline Phosphatase: 99 U/L (ref 39–117)
BUN: 11 mg/dL (ref 6–23)
CO2: 26 mEq/L (ref 19–32)
Chloride: 100 mEq/L (ref 96–112)
Creatinine, Ser: 0.69 mg/dL (ref 0.50–1.10)
GFR calc non Af Amer: >90 mL/min (ref >90)
Potassium: 4.3 mEq/L (ref 3.5–5.1)
Total Bilirubin: 0.2 mg/dL — ABNORMAL LOW (ref 0.3–1.2)

## 2012-09-20 LAB — URINALYSIS, ROUTINE W REFLEX MICROSCOPIC
Hgb urine dipstick: NEGATIVE
Ketones, ur: NEGATIVE mg/dL
Protein, ur: NEGATIVE mg/dL
Urobilinogen, UA: 1 mg/dL (ref 0.0–1.0)

## 2012-09-20 LAB — RAPID URINE DRUG SCREEN, HOSP PERFORMED
Amphetamines: NOT DETECTED
Cocaine: NOT DETECTED
Opiates: NOT DETECTED
Tetrahydrocannabinol: NOT DETECTED

## 2012-09-20 LAB — URINE MICROSCOPIC-ADD ON

## 2012-09-20 LAB — GLUCOSE, CAPILLARY: Glucose-Capillary: 149 mg/dL — ABNORMAL HIGH (ref 70–99)

## 2012-09-20 LAB — TROPONIN I: Troponin I: <0.3 ng/mL (ref <0.30)

## 2012-09-20 MED ORDER — SODIUM CHLORIDE 0.9 % IV SOLN: INTRAVENOUS | Status: DC

## 2012-09-20 MED ORDER — ONDANSETRON HCL 4 MG/2ML IJ SOLN: 4.0000 mg | Freq: Three times a day (TID) | INTRAMUSCULAR | Status: AC | PRN

## 2012-09-20 MED ORDER — ALBUTEROL SULFATE (5 MG/ML) 0.5% IN NEBU: 2.5000 mg | INHALATION_SOLUTION | RESPIRATORY_TRACT | Status: AC | PRN

## 2012-09-20 MED ORDER — SODIUM CHLORIDE 0.9 % IV BOLUS (SEPSIS)
1000.0000 mL | Freq: Once | INTRAVENOUS | Status: AC
  Administered 2012-09-20: 1000 mL via INTRAVENOUS

## 2012-09-20 MED ORDER — ONDANSETRON HCL 4 MG PO TABS: 4.0000 mg | ORAL_TABLET | Freq: Four times a day (QID) | ORAL | Status: DC | PRN

## 2012-09-20 MED ORDER — DOCUSATE SODIUM 100 MG PO CAPS
200.0000 mg | ORAL_CAPSULE | Freq: Two times a day (BID) | ORAL | Status: DC
  Administered 2012-09-20 – 2012-09-27 (×14): 200 mg via ORAL
  Filled 2012-09-20 (×15): qty 2

## 2012-09-20 MED ORDER — GABAPENTIN 300 MG PO CAPS
600.0000 mg | ORAL_CAPSULE | Freq: Three times a day (TID) | ORAL | Status: DC
  Administered 2012-09-20 – 2012-09-27 (×27): 600 mg via ORAL
  Filled 2012-09-20 (×31): qty 2

## 2012-09-20 MED ORDER — SODIUM CHLORIDE 0.9 % IV SOLN
INTRAVENOUS | Status: AC
  Administered 2012-09-20: via INTRAVENOUS

## 2012-09-20 MED ORDER — IOHEXOL 350 MG/ML SOLN
100.0000 mL | Freq: Once | INTRAVENOUS | Status: AC | PRN
Start: 2012-09-20 — End: 2012-09-20
  Administered 2012-09-20: 100 mL via INTRAVENOUS

## 2012-09-20 MED ORDER — CITALOPRAM HYDROBROMIDE 40 MG PO TABS
40.0000 mg | ORAL_TABLET | Freq: Every morning | ORAL | Status: DC
  Administered 2012-09-21 – 2012-09-27 (×7): 40 mg via ORAL
  Filled 2012-09-20 (×7): qty 1

## 2012-09-20 MED ORDER — HYDROCODONE-ACETAMINOPHEN 7.5-325 MG PO TABS
1.0000 | ORAL_TABLET | Freq: Four times a day (QID) | ORAL | Status: DC | PRN
  Administered 2012-09-20 – 2012-09-27 (×13): 1 via ORAL
  Filled 2012-09-20 (×13): qty 1

## 2012-09-20 MED ORDER — METFORMIN HCL 500 MG PO TABS
500.0000 mg | ORAL_TABLET | Freq: Two times a day (BID) | ORAL | Status: DC
  Administered 2012-09-23 – 2012-09-24 (×3): 500 mg via ORAL
  Filled 2012-09-20 (×9): qty 1

## 2012-09-20 MED ORDER — ADULT MULTIVITAMIN W/MINERALS CH
1.0000 | ORAL_TABLET | Freq: Every morning | ORAL | Status: DC
  Administered 2012-09-21 – 2012-09-27 (×7): 1 via ORAL
  Filled 2012-09-20 (×7): qty 1

## 2012-09-20 MED ORDER — ONDANSETRON HCL 4 MG/2ML IJ SOLN: 4.0000 mg | Freq: Three times a day (TID) | INTRAMUSCULAR | Status: DC | PRN

## 2012-09-20 MED ORDER — GABAPENTIN 600 MG PO TABS: 600.0000 mg | ORAL_TABLET | Freq: Four times a day (QID) | ORAL | Status: DC

## 2012-09-20 MED ORDER — OLANZAPINE 10 MG PO TABS
20.0000 mg | ORAL_TABLET | Freq: Every day | ORAL | Status: DC
  Administered 2012-09-20 – 2012-09-26 (×7): 20 mg via ORAL
  Filled 2012-09-20 (×8): qty 2

## 2012-09-20 MED ORDER — ENOXAPARIN SODIUM 40 MG/0.4ML ~~LOC~~ SOLN
40.0000 mg | SUBCUTANEOUS | Status: DC
  Administered 2012-09-20 – 2012-09-26 (×7): 40 mg via SUBCUTANEOUS
  Filled 2012-09-20 (×8): qty 0.4

## 2012-09-20 MED ORDER — LEVOTHYROXINE SODIUM 150 MCG PO TABS
150.0000 ug | ORAL_TABLET | Freq: Every day | ORAL | Status: DC
  Administered 2012-09-21 – 2012-09-27 (×7): 150 ug via ORAL
  Filled 2012-09-20 (×8): qty 1

## 2012-09-20 MED ORDER — INSULIN ASPART PROT & ASPART (70-30 MIX) 100 UNIT/ML ~~LOC~~ SUSP
15.0000 [IU] | Freq: Two times a day (BID) | SUBCUTANEOUS | Status: DC
  Administered 2012-09-21 – 2012-09-23 (×6): 15 [IU] via SUBCUTANEOUS
  Filled 2012-09-20: qty 10

## 2012-09-20 MED ORDER — CLONAZEPAM 0.5 MG PO TABS
0.5000 mg | ORAL_TABLET | Freq: Three times a day (TID) | ORAL | Status: DC
  Administered 2012-09-20 – 2012-09-27 (×21): 0.5 mg via ORAL
  Filled 2012-09-20 (×21): qty 1

## 2012-09-20 MED ORDER — IPRATROPIUM BROMIDE 0.02 % IN SOLN
0.5000 mg | Freq: Once | RESPIRATORY_TRACT | Status: AC
Start: 2012-09-20 — End: 2012-09-20
  Administered 2012-09-20: 0.5 mg via RESPIRATORY_TRACT
  Filled 2012-09-20: qty 2.5

## 2012-09-20 MED ORDER — ALBUTEROL SULFATE (5 MG/ML) 0.5% IN NEBU
5.0000 mg | INHALATION_SOLUTION | Freq: Once | RESPIRATORY_TRACT | Status: AC
Start: 2012-09-20 — End: 2012-09-20
  Administered 2012-09-20: 5 mg via RESPIRATORY_TRACT
  Filled 2012-09-20: qty 1

## 2012-09-20 MED ORDER — SODIUM CHLORIDE 0.9 % IJ SOLN
3.0000 mL | Freq: Two times a day (BID) | INTRAMUSCULAR | Status: DC
  Administered 2012-09-20 – 2012-09-27 (×14): 3 mL via INTRAVENOUS

## 2012-09-20 MED ORDER — ONDANSETRON HCL 4 MG/2ML IJ SOLN: 4.0000 mg | Freq: Four times a day (QID) | INTRAMUSCULAR | Status: DC | PRN

## 2012-09-20 MED ORDER — INSULIN ASPART 100 UNIT/ML ~~LOC~~ SOLN
0.0000 [IU] | Freq: Three times a day (TID) | SUBCUTANEOUS | Status: DC
  Administered 2012-09-21: 1 [IU] via SUBCUTANEOUS
  Administered 2012-09-21: 3 [IU] via SUBCUTANEOUS
  Administered 2012-09-21: 1 [IU] via SUBCUTANEOUS
  Administered 2012-09-22: 17:00:00 via SUBCUTANEOUS
  Administered 2012-09-22: 2 [IU] via SUBCUTANEOUS
  Administered 2012-09-22: 3 [IU] via SUBCUTANEOUS
  Administered 2012-09-23: 7 [IU] via SUBCUTANEOUS
  Administered 2012-09-23 (×2): 3 [IU] via SUBCUTANEOUS
  Administered 2012-09-24: 1 [IU] via SUBCUTANEOUS

## 2012-09-20 NOTE — ED Provider Notes (Signed)
History     CSN: [id number]  Arrival date & time 09/20/12  1012   First MD Initiated Contact with Patient 09/20/12 1103      Chief Complaint  Patient presents with  . Shortness of Breath  . Generalized Body Aches     HPI Pt was seen at 1140.   Per pt;s family and pt, c/o gradual onset and worsening of persistent generalized weakness/fatigue, SOB and cough for the past 5 days.  Pt's family states pt "hasn't been out of bed in 5 days."  Pt states she feels "too weak."  Denies fevers, no focal motor weakness, no tingling/numbness in extremities, no CP/palpitations, no abd pain, no N/V/D, no back pain, no rash.      Past Medical History  Diagnosis Date  . Thyroid disease   . Diabetes mellitus   . Depression   . Schizophrenia   . Bipolar affective   . Suicidal ideation   . Anxiety   . Hypothyroidism   . Recurrent upper respiratory infection (URI)   . Pneumonia     11/2011  . Arthritis     back, hands  . Shortness of breath     with exertion  . Headache   . Blood transfusion   . History of DVT of lower extremity     left, after pregnancy    Past Surgical History  Procedure Date  . Appendectomy   . Tubal ligation   . Esophagogastroduodenoscopy 11/23/2011    Procedure: ESOPHAGOGASTRODUODENOSCOPY (EGD);  Surgeon: Freddy Jaksch, MD;  Location: [facility];  Service: Endoscopy;  Laterality: N/A;    Family History  Problem Relation Age of Onset  . Diabetes Mother   . Lung cancer Father   . Heart failure Father     History  Substance Use Topics  . Smoking status: Never Smoker   . Smokeless tobacco: Never Used  . Alcohol Use: No    Review of Systems ROS: Statement: All systems negative except as marked or noted in the HPI; Constitutional: Negative for fever and +chills, generalized body aches/fatigue/weakness.  ; ; Eyes: Negative for eye pain, redness and discharge. ; ; ENMT: Negative for ear pain, hoarseness, nasal congestion, sinus pressure and sore throat. ; ;  Cardiovascular: Negative for chest pain, palpitations, diaphoresis, and peripheral edema. ; ; Respiratory: +cough, SOB. Negative for wheezing and stridor. ; ; Gastrointestinal: Negative for nausea, vomiting, diarrhea, abdominal pain, blood in stool, hematemesis, jaundice and rectal bleeding. . ; ; Genitourinary: Negative for dysuria, flank pain and hematuria. ; ; Musculoskeletal: Negative for back pain and neck pain. Negative for swelling and trauma.; ; Skin: Negative for pruritus, rash, abrasions, blisters, bruising and skin lesion.; ; Neuro: Negative for headache, lightheadedness and neck stiffness. Negative for altered level of consciousness , altered mental status, extremity weakness, paresthesias, involuntary movement, seizure and syncope.      Allergies  Sulfa antibiotics  Home Medications   Current Outpatient Rx  Name  Route  Sig  Dispense  Refill  . CITALOPRAM HYDROBROMIDE 40 MG PO TABS   Oral   Take 40 mg by mouth every morning.         Marland Kitchen CLONAZEPAM 0.5 MG PO TABS   Oral   Take 0.5 mg by mouth 3 (three) times daily.         Marland Kitchen DOCUSATE SODIUM 100 MG PO CAPS   Oral   Take 200 mg by mouth 2 (two) times daily.          Marland Kitchen  GABAPENTIN 600 MG PO TABS   Oral   Take 600 mg by mouth 4 (four) times daily.          Marland Kitchen HYDROCODONE-ACETAMINOPHEN 7.5-325 MG PO TABS   Oral   Take 1 tablet by mouth every 6 (six) hours as needed. Pain         . INSULIN ISOPHANE & REGULAR (70-30) 100 UNIT/ML [place] SUSP   Subcutaneous   Inject 20-26 Units into the skin 2 (two) times daily. Take 26 units at 7am and 20 units and 7pm         . LEVOTHYROXINE SODIUM 150 MCG PO TABS   Oral   Take 150 mcg by mouth daily before breakfast.          . METFORMIN HCL 500 MG PO TABS   Oral   Take 500 mg by mouth 2 (two) times daily with a meal.         . ADULT MULTIVITAMIN W/MINERALS CH   Oral   Take 1 tablet by mouth every morning.         Marland Kitchen OLANZAPINE 20 MG PO TABS   Oral   Take 20 mg by mouth  at bedtime.           BP 156/63  Pulse 102  Temp 98.9 F (37.2 C) (Oral)  Resp 13  SpO2 96%  Physical Exam 1145: Physical examination:  Nursing notes reviewed; Vital signs and O2 SAT reviewed;  Constitutional: Well developed, Well nourished, Uncomfortable appearing; Head:  Normocephalic, atraumatic; Eyes: EOMI, PERRL, No scleral icterus; ENMT: Mouth and pharynx normal, Mucous membranes dry; Neck: Supple, Full range of motion, No lymphadenopathy; Cardiovascular: Regular rate and rhythm, No murmur, rub, or gallop; Respiratory: Breath sounds coarse & equal bilaterally, scattered faint wheezes.  Speaking full sentences, Normal respiratory effort/excursion; Chest: Nontender, Movement normal; Abdomen: Soft, Nontender, Nondistended, Normal bowel sounds; Genitourinary: No CVA tenderness; Extremities: Pulses normal, No tenderness, No edema, No calf edema or asymmetry.; Neuro: AA&Ox3, Major CN grossly intact.  Speech clear. No gross focal motor or sensory deficits in extremities.; Skin: Color normal, Warm, Dry.   ED Course  Procedures   1145:  Pt's O2 Sat 84% R/A on arrival to ED, increases to 96% on O2 2L N/C.  Will start neb, workup ordered.  1530:  Neb completed, Sats 96% on O2 N/C, lungs coarse, resps easy, speaking in full sentences. Pt became tachycardic on orthostatic VS.  Attempted to walk pt, but after a very short distance pt c/o feeling "weak" and near syncopal, with her O2 Sats dropping to 94% R/A from 96% R/A at rest on stretcher after O2 N/C removed. No PE or pneumonia on CT-A chest.  Will admit for dehydration, bronchospasm/SOB. T/C to [facility] Dr. Izola Price, case discussed, including:  HPI, pertinent PM/SHx, VS/PE, dx testing, ED course and treatment:  Agreeable to observation admit, requests to write temporary orders, obtain tele bed to team 5.   MDM  MDM Reviewed: previous chart, nursing note and vitals Reviewed previous: ECG Interpretation: ECG, labs and x-ray    Date: 09/20/2012   Rate: 116  Rhythm: sinus tachycardia  QRS Axis: normal  Intervals: normal  ST/T Wave abnormalities: normal  Conduction Disutrbances:none  Narrative Interpretation:   Old EKG Reviewed: unchanged; no significant changes from previous EKG dated 11/17/2011.   Results for orders placed during the hospital encounter of 09/20/12  CBC WITH DIFFERENTIAL      Component Value Range   WBC 7.7  4.0 - 10.5  K/uL   RBC 4.23  3.87 - 5.11 MIL/uL   Hemoglobin 12.3  12.0 - 15.0 g/dL   HCT 16.1  09.6 - 04.5 %   MCV 85.6  78.0 - 100.0 fL   MCH 29.1  26.0 - 34.0 pg   MCHC 34.0  30.0 - 36.0 g/dL   RDW 40.9  81.1 - 91.4 %   Platelets 260  150 - 400 K/uL   Neutrophils Relative 56  43 - 77 %   Neutro Abs 4.3  1.7 - 7.7 K/uL   Lymphocytes Relative 27  12 - 46 %   Lymphs Abs 2.1  0.7 - 4.0 K/uL   Monocytes Relative 10  3 - 12 %   Monocytes Absolute 0.8  0.1 - 1.0 K/uL   Eosinophils Relative 6 (*) 0 - 5 %   Eosinophils Absolute 0.5  0.0 - 0.7 K/uL   Basophils Relative 0  0 - 1 %   Basophils Absolute 0.0  0.0 - 0.1 K/uL  COMPREHENSIVE METABOLIC PANEL      Component Value Range   Sodium 136  135 - 145 mEq/L   Potassium 4.3  3.5 - 5.1 mEq/L   Chloride 100  96 - 112 mEq/L   CO2 26  19 - 32 mEq/L   Glucose, Bld 190 (*) 70 - 99 mg/dL   BUN 11  6 - 23 mg/dL   Creatinine, Ser 7.82  0.50 - 1.10 mg/dL   Calcium 8.9  8.4 - 95.6 mg/dL   Total Protein 6.9  6.0 - 8.3 g/dL   Albumin 3.5  3.5 - 5.2 g/dL   AST 34  0 - 37 U/L   ALT 37 (*) 0 - 35 U/L   Alkaline Phosphatase 99  39 - 117 U/L   Total Bilirubin 0.2 (*) 0.3 - 1.2 mg/dL   GFR calc non Af Amer >90  >90 mL/min   GFR calc Af Amer >90  >90 mL/min  URINALYSIS, ROUTINE W REFLEX MICROSCOPIC      Component Value Range   Color, Urine YELLOW  YELLOW   APPearance CLEAR  CLEAR   Specific Gravity, Urine 1.012  1.005 - 1.030   pH 7.5  5.0 - 8.0   Glucose, UA 250 (*) NEGATIVE mg/dL   Hgb urine dipstick NEGATIVE  NEGATIVE   Bilirubin Urine NEGATIVE  NEGATIVE    Ketones, ur NEGATIVE  NEGATIVE mg/dL   Protein, ur NEGATIVE  NEGATIVE mg/dL   Urobilinogen, UA 1.0  0.0 - 1.0 mg/dL   Nitrite NEGATIVE  NEGATIVE   Leukocytes, UA TRACE (*) NEGATIVE  TROPONIN I      Component Value Range   Troponin I <0.30  <0.30 ng/mL  LACTIC ACID, PLASMA      Component Value Range   Lactic Acid, Venous 1.6  0.5 - 2.2 mmol/L  GLUCOSE, CAPILLARY      Component Value Range   Glucose-Capillary 149 (*) 70 - 99 mg/dL   Comment 1 Notify RN    URINE MICROSCOPIC-ADD ON      Component Value Range   Squamous Epithelial / LPF RARE  RARE   WBC, UA 0-2  <3 WBC/hpf   Bacteria, UA RARE  RARE   Dg Chest 2 View 09/20/2012  *RADIOLOGY REPORT*  Clinical Data: Short of breath.  Body aches.  CHEST - 2 VIEW  Comparison: 11/22/2011.  Findings: Mild atelectasis is present along the minor fissure.  No airspace disease.  No effusion.  Left basilar atelectasis. Cardiopericardial  silhouette and mediastinal contours are within normal limits. Monitoring leads are projected over the chest.  IMPRESSION: Scattered areas of atelectasis.  No acute cardiopulmonary disease.   Original Report Authenticated By: Andreas Newport, M.D.    Ct Angio Chest Pe W/cm &/or Wo Cm 09/20/2012  *RADIOLOGY REPORT*  Clinical Data: Rule out pulmonary embolus.  CT ANGIOGRAPHY CHEST  Technique:  Multidetector CT imaging of the chest using the standard protocol during bolus administration of intravenous contrast. Multiplanar reconstructed images including MIPs were obtained and reviewed to evaluate the vascular anatomy.  Contrast: OMNIPAQUE IOHEXOL 350 MG/ML SOLN  Comparison: 11/17/2011  Findings: Lungs/pleura: No pleural effusion.  Nonspecific bronchial wall thickening is identified.  Patchy sub solid density within the peribronchovascular right upper lobe is identified, image 38.  This measures 1.1 cm.  Heart/Mediastinum: The heart size appears normal.  No pericardial effusion.  Prominent mediastinal lymph nodes are again  identified. Low right paratracheal lymph node measures 1.1 cm, image 30.  This is compared with 1.3 cm previously. Right hilar lymph node measures 1.3 cm, image 36.  This is compared with 1.2 cm previously.  No abnormal filling defects within the pulmonary arteries to suggest an acute pulmonary emboli.  Upper abdomen: Mild diffuse low attenuation within the liver parenchyma.  Bones/Musculoskeletal:  No abnormalities identified.  IMPRESSION:  1.  No evidence for acute pulmonary embolus. 2.  Sub solid nodule in the right upper lobe is nonspecific and may be the sequela of inflammation or infection.  Follow-up examination in 3 months is recommended to assess for persistence. This recommendation follows the consensus statement: Recommendations for the Management of Subsolid Pulmonary Nodules Detected at CT:  A Statement from the Fleischner Society.  Radiology 2013; 266:304- 317. 3. Similar appearance of borderline enlarged mediastinal and hilar lymph nodes. 4.  Hepatic steatosis.   Original Report Authenticated By: Signa Kell, M.D.               Laray Anger, DO 09/21/12 2221

## 2012-09-20 NOTE — ED Notes (Signed)
Patient ambulated after the MD talked to her she walk from the bed to the hall and it seem as if she was going to fall. o2stats 94 heart rate 107.

## 2012-09-20 NOTE — ED Notes (Signed)
Patient went to x-ray I will perform orthostatic vitals when she return

## 2012-09-20 NOTE — ED Notes (Signed)
Patient refused to walk I made the MD aware

## 2012-09-20 NOTE — H&P (Addendum)
[facility] History and Physical  Cheryl [name] AVW:[id number] DOB: August 16, 1956 DOA: 09/20/2012  Referring physician: ED physician PCP: Quitman Livings, MD   Chief Complaint: Shortness of breath   HPI:  Pt is 57 yo female who presents to [facility] ED with main concern of progressively worsening shortness of breath that initially started 3-4 days prior to admission and associated with productive cough of clear sputum generalized weakness, poor oral intake. Pt reports similar events in the past when she was diagnosed with PNA. She denies chest pain or fevers, chills, no other abdominal or urinary concerns. Pt denies any specific focal neurological weakness, no headaches, no visual changes. In ED, pt found to be hypoxic with saturations in high 80's and low 90's. CT chest negative for PE or PNA. [facility] asked to admit for observations.   Assessment and Plan:  Principal Problem:  *SOB (shortness of breath) - unclear etiology as CXR and CT chest unremarkable for acute findings - will admit the pt to telemetry floor  - check TSH, UDS - monitor vitals on telemetry unit  Active Problems:  Right upper lung lobe nodule - noted on CT angio chest, recommendation is to repeat CT chest in 3 months to ensure stability   DM (diabetes mellitus) - will check A1C to assess level of control - continue home medication regimen   Bipolar 1 disorder - clinically stable  Hypothyroidism - will check TSH and will continue home dose Synthroid   Code Status: Full Family Communication: Pt at bedside Disposition Plan: Admit to telemetry bed for evaluation   Review of Systems:  Constitutional: Negative for fever, chills and malaise/fatigue. Negative for diaphoresis.  HENT: Negative for hearing loss, ear pain, nosebleeds, congestion, sore throat, neck pain, tinnitus and ear discharge.   Eyes: Negative for blurred vision, double vision, photophobia, pain, discharge and redness.  Respiratory: Positive for cough, sputum  production, shortness of breath  Cardiovascular: Negative for chest pain, palpitations, orthopnea, claudication and leg swelling.  Gastrointestinal: Negative for nausea, vomiting and abdominal pain. Negative for heartburn, constipation, blood in stool and melena.  Genitourinary: Negative for dysuria, urgency, frequency, hematuria and flank pain.  Musculoskeletal: Negative for myalgias, back pain, joint pain and falls.  Skin: Negative for itching and rash.  Neurological: Negative for dizziness and weakness. Negative for tingling, tremors, sensory change, speech change, focal weakness, loss of consciousness and headaches.  Endo/Heme/Allergies: Negative for environmental allergies and polydipsia. Does not bruise/bleed easily.  Psychiatric/Behavioral: Negative for suicidal ideas. The patient is not nervous/anxious.      Past Medical History  Diagnosis Date  . Thyroid disease   . Diabetes mellitus   . Depression   . Schizophrenia   . Bipolar affective   . Suicidal ideation   . Anxiety   . Hypothyroidism   . Recurrent upper respiratory infection (URI)   . Pneumonia     11/2011  . Arthritis     back, hands  . Shortness of breath     with exertion  . Headache   . Blood transfusion   . History of DVT of lower extremity     left, after pregnancy    Past Surgical History  Procedure Date  . Appendectomy   . Tubal ligation   . Esophagogastroduodenoscopy 11/23/2011    Procedure: ESOPHAGOGASTRODUODENOSCOPY (EGD);  Surgeon: Freddy Jaksch, MD;  Location: [facility];  Service: Endoscopy;  Laterality: N/A;    Social History:  reports that she has never smoked. She has never used smokeless tobacco. She  reports that she does not drink alcohol or use illicit drugs.  Allergies  Allergen Reactions  . Sulfa Antibiotics Rash    Family History  Problem Relation Age of Onset  . Diabetes Mother   . Lung cancer Father   . Heart failure Father     Prior to Admission medications     Medication Sig Start Date End Date Taking? Authorizing Provider  citalopram (CELEXA) 40 MG tablet Take 40 mg by mouth every morning.   Yes Historical Provider, MD  clonazePAM (KLONOPIN) 0.5 MG tablet Take 0.5 mg by mouth 3 (three) times daily.   Yes Historical Provider, MD  docusate sodium (COLACE) 100 MG capsule Take 200 mg by mouth 2 (two) times daily.    Yes Historical Provider, MD  gabapentin (NEURONTIN) 600 MG tablet Take 600 mg by mouth 4 (four) times daily.    Yes Historical Provider, MD  HYDROcodone-acetaminophen (NORCO) 7.5-325 MG per tablet Take 1 tablet by mouth every 6 (six) hours as needed. Pain   Yes Historical Provider, MD  insulin NPH-insulin regular (NOVOLIN 70/30) (70-30) 100 UNIT/ML injection Inject 20-26 Units into the skin 2 (two) times daily. Take 26 units at 7am and 20 units and 7pm   Yes Historical Provider, MD  levothyroxine (SYNTHROID, LEVOTHROID) 150 MCG tablet Take 150 mcg by mouth daily before breakfast.    Yes Historical Provider, MD  metFORMIN (GLUCOPHAGE) 500 MG tablet Take 500 mg by mouth 2 (two) times daily with a meal.   Yes Historical Provider, MD  Multiple Vitamin (MULITIVITAMIN WITH MINERALS) TABS Take 1 tablet by mouth every morning.   Yes Historical Provider, MD  OLANZapine (ZYPREXA) 20 MG tablet Take 20 mg by mouth at bedtime.   Yes Historical Provider, MD    Physical Exam: Filed Vitals:   09/20/12 1252 09/20/12 1253 09/20/12 1254 09/20/12 1446  BP: 143/63 122/66 129/62 143/54  Pulse: 99 107 111 96  Temp:    98.3 F (36.8 C)  TempSrc:    Oral  Resp:    20  SpO2:    94%    Physical Exam  Constitutional: Appears well-developed and well-nourished. No distress.  HENT: Normocephalic. External right and left ear normal. Oropharynx is clear and moist.  Eyes: Conjunctivae and EOM are normal. PERRLA, no scleral icterus.  Neck: Normal ROM. Neck supple. No JVD. No tracheal deviation. No thyromegaly.  CVS: RRR, S1/S2 +, no murmurs, no gallops, no carotid  bruit.  Pulmonary: Effort and breath sounds normal, no stridor, rhonchi, wheezes, rales.  Abdominal: Soft. BS +,  no distension, tenderness, rebound or guarding.  Musculoskeletal: Normal range of motion. No edema and no tenderness.  Lymphadenopathy: No lymphadenopathy noted, cervical, inguinal. Neuro: Alert. Normal reflexes, muscle tone coordination. No cranial nerve deficit. Skin: Skin is warm and dry. No rash noted. Not diaphoretic. No erythema. No pallor.  Psychiatric: Normal mood and affect. Behavior, judgment, thought content normal.   Labs on Admission:  Basic Metabolic Panel:  Lab 09/20/12 8295  NA 136  K 4.3  CL 100  CO2 26  GLUCOSE 190*  BUN 11  CREATININE 0.69  CALCIUM 8.9  MG --  PHOS --   Liver Function Tests:  Lab 09/20/12 1054  AST 34  ALT 37*  ALKPHOS 99  BILITOT 0.2*  PROT 6.9  ALBUMIN 3.5   CBC:  Lab 09/20/12 1054  WBC 7.7  NEUTROABS 4.3  HGB 12.3  HCT 36.2  MCV 85.6  PLT 260   Cardiac Enzymes:  Lab  09/20/12 1146  CKTOTAL --  CKMB --  CKMBINDEX --  TROPONINI <0.30   CBG:  Lab 09/20/12 1151  GLUCAP 149*    Radiological Exams on Admission: Dg Chest 2 View 09/20/2012    Scattered areas of atelectasis.  No acute cardiopulmonary disease.     Ct Angio Chest Pe W/cm &/or Wo Cm 09/20/2012  1.  No evidence for acute pulmonary embolus.  2.  Sub solid nodule in the right upper lobe is nonspecific and may be the sequela of inflammation or infection.  3. Similar appearance of borderline enlarged mediastinal and hilar lymph nodes.  4.  Hepatic steatosis.     EKG: Normal sinus rhythm, no ST/T wave changes  Debbora Presto, MD  [facility] Pager [phone]  If 7PM-7AM, please contact night-coverage [email or web address] Password [facility] 09/20/2012, 5:26 PM

## 2012-09-20 NOTE — ED Notes (Addendum)
Pt refuse to walk up to bedside commode to void Dr Joan Mayans aware

## 2012-09-20 NOTE — ED Notes (Signed)
Pt presents w/ shortness of breath, cough, head ache, and body aches all over. Daughter states she has been in the bed for 5 days and she has tried to keep liquids in her d/t pt's DM. Denies emesis or diarrhea

## 2012-09-20 NOTE — ED Notes (Signed)
RN to obtain labs with start of IV 

## 2012-09-20 NOTE — ED Notes (Signed)
ZOX:[id number]<AV> Expected date:09/20/12<BR> Expected time:10:56 AM<BR> Means of arrival:<BR> Comments:<BR> Hold T - 4

## 2012-09-21 DIAGNOSIS — R5381 Other malaise: Secondary | ICD-10-CM

## 2012-09-21 DIAGNOSIS — E039 Hypothyroidism, unspecified: Secondary | ICD-10-CM

## 2012-09-21 DIAGNOSIS — R531 Weakness: Secondary | ICD-10-CM | POA: Diagnosis present

## 2012-09-21 DIAGNOSIS — J209 Acute bronchitis, unspecified: Secondary | ICD-10-CM | POA: Diagnosis present

## 2012-09-21 DIAGNOSIS — R5383 Other fatigue: Secondary | ICD-10-CM

## 2012-09-21 LAB — BASIC METABOLIC PANEL
CO2: 24 mEq/L (ref 19–32)
GFR calc non Af Amer: >90 mL/min (ref >90)
Glucose, Bld: 156 mg/dL — ABNORMAL HIGH (ref 70–99)
Potassium: 3.8 mEq/L (ref 3.5–5.1)
Sodium: 138 mEq/L (ref 135–145)

## 2012-09-21 LAB — CBC
Hemoglobin: 11.4 g/dL — ABNORMAL LOW (ref 12.0–15.0)
MCH: 28.1 pg (ref 26.0–34.0)
Platelets: 249 10*3/uL (ref 150–400)
RBC: 4.06 MIL/uL (ref 3.87–5.11)
WBC: 5.3 10*3/uL (ref 4.0–10.5)

## 2012-09-21 LAB — GLUCOSE, CAPILLARY: Glucose-Capillary: 216 mg/dL — ABNORMAL HIGH (ref 70–99)

## 2012-09-21 LAB — BLOOD GAS, ARTERIAL
Bicarbonate: 25.5 mEq/L — ABNORMAL HIGH (ref 20.0–24.0)
TCO2: 23.6 mmol/L (ref 0–100)
pCO2 arterial: 50 mmHg — ABNORMAL HIGH (ref 35.0–45.0)
pH, Arterial: 7.329 — ABNORMAL LOW (ref 7.350–7.450)

## 2012-09-21 LAB — URINE CULTURE: Colony Count: 35000

## 2012-09-21 LAB — PRO B NATRIURETIC PEPTIDE: Pro B Natriuretic peptide (BNP): 6.9 pg/mL (ref 0–125)

## 2012-09-21 LAB — HEMOGLOBIN A1C: Hgb A1c MFr Bld: 7.2 % — ABNORMAL HIGH (ref <5.7)

## 2012-09-21 MED ORDER — PANTOPRAZOLE SODIUM 40 MG PO TBEC
40.0000 mg | DELAYED_RELEASE_TABLET | Freq: Every day | ORAL | Status: DC
  Administered 2012-09-21 – 2012-09-23 (×3): 40 mg via ORAL
  Filled 2012-09-21 (×3): qty 1

## 2012-09-21 MED ORDER — GUAIFENESIN-DM 100-10 MG/5ML PO SYRP
5.0000 mL | ORAL_SOLUTION | ORAL | Status: DC | PRN
  Administered 2012-09-24 – 2012-09-25 (×2): 5 mL via ORAL
  Filled 2012-09-21 (×2): qty 10

## 2012-09-21 MED ORDER — METHYLPREDNISOLONE SODIUM SUCC 40 MG IJ SOLR
40.0000 mg | Freq: Three times a day (TID) | INTRAMUSCULAR | Status: AC
  Administered 2012-09-21 (×2): 40 mg via INTRAVENOUS
  Filled 2012-09-21 (×2): qty 1

## 2012-09-21 MED ORDER — ALBUTEROL SULFATE (5 MG/ML) 0.5% IN NEBU
2.5000 mg | INHALATION_SOLUTION | Freq: Four times a day (QID) | RESPIRATORY_TRACT | Status: DC
  Administered 2012-09-21 – 2012-09-22 (×7): 2.5 mg via RESPIRATORY_TRACT
  Filled 2012-09-21 (×7): qty 0.5

## 2012-09-21 MED ORDER — DM-GUAIFENESIN ER 30-600 MG PO TB12
1.0000 | ORAL_TABLET | Freq: Two times a day (BID) | ORAL | Status: DC
  Administered 2012-09-21 – 2012-09-23 (×6): 1 via ORAL
  Filled 2012-09-21 (×8): qty 1

## 2012-09-21 MED ORDER — PREDNISONE 20 MG PO TABS
40.0000 mg | ORAL_TABLET | Freq: Every day | ORAL | Status: DC
  Administered 2012-09-22: 40 mg via ORAL
  Filled 2012-09-21 (×2): qty 2

## 2012-09-21 NOTE — Progress Notes (Addendum)
[facility] PROGRESS NOTE  ANASHA PERFECTO RUE:[id number] DOB: 03/10/1956 DOA: 09/20/2012 PCP: Quitman Livings, MD  Assessment/Plan: *SOB (shortness of breath)/Bronchitis with bronchospasm  -CXR and CT chest unremarkable for acute findings  -Place on bronchodilators, Solu-Medrol today and change to prednisone in a.m. - Will obtain ABG and BNP to further evaluate, obtained another set of cardiac enzymes. - TSH is within normal limits, UDS is negative  Active Problems:  Right upper lung lobe nodule  - noted on CT angio chest, recommendation is to repeat CT chest in 3 months to ensure stability  DM (diabetes mellitus)  -  check A1C is 7.2 - continue home medication regimen  Bipolar 1 disorder  - clinically stable  Hypothyroidism  -TSH is within normal limits as above and will continue home dose Synthroid  Weakness -Consult PT, TSH within normal limits as above  Code Status: Full Family Communication: Daughter at bedside Disposition Plan: To home when medically stable   Consultants:  None  Procedures:  None  Antibiotics:  none  HPI/Subjective: +Cough-states nonproductive today, still short of breath. Denies chest pain  Objective: Filed Vitals:   09/20/12 1446 09/20/12 1942 09/20/12 2005 09/21/12 0646  BP: 143/54 119/79 123/65 140/77  Pulse: 96 90 90 92  Temp: 98.3 F (36.8 C) 98.1 F (36.7 C) 98.8 F (37.1 C) 97.8 F (36.6 C)  TempSrc: Oral Oral Oral Oral  Resp: 20 18 20 18   Height:  5\' 6"  (1.676 m)    Weight:  101.6 kg (223 lb 15.8 oz)    SpO2: 94% 97% 97% 96%    Intake/Output Summary (Last 24 hours) at 09/21/12 1225 Last data filed at 09/21/12 0900  Gross per 24 hour  Intake 1563.33 ml  Output   1825 ml  Net -261.67 ml   Filed Weights   09/20/12 1942  Weight: 101.6 kg (223 lb 15.8 oz)    Exam:   General: Older female, acutely ill-appearing in no respiratory distress  Cardiovascular: Regular rate and rhythm, normal S1-S2  Respiratory: Diffuse  wheezing bilaterally, no crackles  Abdomen: Soft, bowel sounds present nontender nondistended no organomegaly and no masses palpable  Extremities: No cyanosis and no edema  Data Reviewed: Basic Metabolic Panel:  Lab 09/21/12 1478 09/20/12 1054  NA 138 136  K 3.8 4.3  CL 104 100  CO2 24 26  GLUCOSE 156* 190*  BUN 13 11  CREATININE 0.75 0.69  CALCIUM 8.6 8.9  MG -- --  PHOS -- --   Liver Function Tests:  Lab 09/20/12 1054  AST 34  ALT 37*  ALKPHOS 99  BILITOT 0.2*  PROT 6.9  ALBUMIN 3.5   No results found for this basename: LIPASE:5,AMYLASE:5 in the last 168 hours No results found for this basename: AMMONIA:5 in the last 168 hours CBC:  Lab 09/21/12 0541 09/20/12 1054  WBC 5.3 7.7  NEUTROABS -- 4.3  HGB 11.4* 12.3  HCT 35.2* 36.2  MCV 86.7 85.6  PLT 249 260   Cardiac Enzymes:  Lab 09/21/12 0951 09/20/12 1146  CKTOTAL -- --  CKMB -- --  CKMBINDEX -- --  TROPONINI <0.30 <0.30   BNP (last 3 results)  Basename 09/21/12 0951  PROBNP 6.9   CBG:  Lab 09/21/12 0738 09/20/12 2230 09/20/12 1151  GLUCAP 146* 122* 149*    No results found for this or any previous visit (from the past 240 hour(s)).   Studies: Dg Chest 2 View  09/20/2012  *RADIOLOGY REPORT*  Clinical Data: Short of breath.  Body aches.  CHEST - 2 VIEW  Comparison: 11/22/2011.  Findings: Mild atelectasis is present along the minor fissure.  No airspace disease.  No effusion.  Left basilar atelectasis. Cardiopericardial silhouette and mediastinal contours are within normal limits. Monitoring leads are projected over the chest.  IMPRESSION: Scattered areas of atelectasis.  No acute cardiopulmonary disease.   Original Report Authenticated By: Andreas Newport, M.D.    Ct Angio Chest Pe W/cm &/or Wo Cm  09/20/2012  *RADIOLOGY REPORT*  Clinical Data: Rule out pulmonary embolus.  CT ANGIOGRAPHY CHEST  Technique:  Multidetector CT imaging of the chest using the standard protocol during bolus administration  of intravenous contrast. Multiplanar reconstructed images including MIPs were obtained and reviewed to evaluate the vascular anatomy.  Contrast: OMNIPAQUE IOHEXOL 350 MG/ML SOLN  Comparison: 11/17/2011  Findings: Lungs/pleura: No pleural effusion.  Nonspecific bronchial wall thickening is identified.  Patchy sub solid density within the peribronchovascular right upper lobe is identified, image 38.  This measures 1.1 cm.  Heart/Mediastinum: The heart size appears normal.  No pericardial effusion.  Prominent mediastinal lymph nodes are again identified. Low right paratracheal lymph node measures 1.1 cm, image 30.  This is compared with 1.3 cm previously. Right hilar lymph node measures 1.3 cm, image 36.  This is compared with 1.2 cm previously.  No abnormal filling defects within the pulmonary arteries to suggest an acute pulmonary emboli.  Upper abdomen: Mild diffuse low attenuation within the liver parenchyma.  Bones/Musculoskeletal:  No abnormalities identified.  IMPRESSION:  1.  No evidence for acute pulmonary embolus. 2.  Sub solid nodule in the right upper lobe is nonspecific and may be the sequela of inflammation or infection.  Follow-up examination in 3 months is recommended to assess for persistence. This recommendation follows the consensus statement: Recommendations for the Management of Subsolid Pulmonary Nodules Detected at CT:  A Statement from the Fleischner Society.  Radiology 2013; 266:304- 317. 3. Similar appearance of borderline enlarged mediastinal and hilar lymph nodes. 4.  Hepatic steatosis.   Original Report Authenticated By: Signa Kell, M.D.     Scheduled Meds:   . albuterol  2.5 mg Nebulization QID  . citalopram  40 mg Oral q morning - 10a  . clonazePAM  0.5 mg Oral TID  . dextromethorphan-guaiFENesin  1 tablet Oral BID  . docusate sodium  200 mg Oral BID  . enoxaparin (LOVENOX) injection  40 mg Subcutaneous Q24H  . gabapentin  600 mg Oral TID PC & HS  . insulin aspart   0-9 Units Subcutaneous TID WC  . insulin aspart protamine-insulin aspart  15 Units Subcutaneous BID WC  . levothyroxine  150 mcg Oral QAC breakfast  . metFORMIN  500 mg Oral BID WC  . methylPREDNISolone (SOLU-MEDROL) injection  40 mg Intravenous Q8H  . multivitamin with minerals  1 tablet Oral q morning - 10a  . OLANZapine  20 mg Oral QHS  . predniSONE  40 mg Oral Q breakfast  . sodium chloride  3 mL Intravenous Q12H   Continuous Infusions:   Principal Problem:  *SOB (shortness of breath) Active Problems:  Bipolar 1 disorder  DM (diabetes mellitus)    Time spent:    [facility]  [facility] Pager [phone]. If 8PM-8AM, please contact night-coverage at [email or web address], password [facility] 09/21/2012, 12:25 PM  LOS: 1 day

## 2012-09-22 DIAGNOSIS — R0602 Shortness of breath: Secondary | ICD-10-CM

## 2012-09-22 DIAGNOSIS — K219 Gastro-esophageal reflux disease without esophagitis: Secondary | ICD-10-CM

## 2012-09-22 LAB — GLUCOSE, CAPILLARY: Glucose-Capillary: 183 mg/dL — ABNORMAL HIGH (ref 70–99)

## 2012-09-22 MED ORDER — METHYLPREDNISOLONE SODIUM SUCC 40 MG IJ SOLR
40.0000 mg | Freq: Three times a day (TID) | INTRAMUSCULAR | Status: DC
  Administered 2012-09-22 – 2012-09-23 (×5): 40 mg via INTRAVENOUS
  Filled 2012-09-22 (×6): qty 1

## 2012-09-22 MED ORDER — ALBUTEROL SULFATE (5 MG/ML) 0.5% IN NEBU: 2.5000 mg | INHALATION_SOLUTION | RESPIRATORY_TRACT | Status: DC | PRN

## 2012-09-22 MED ORDER — ALBUTEROL SULFATE (5 MG/ML) 0.5% IN NEBU
2.5000 mg | INHALATION_SOLUTION | RESPIRATORY_TRACT | Status: DC
  Administered 2012-09-22 – 2012-09-23 (×6): 2.5 mg via RESPIRATORY_TRACT
  Filled 2012-09-22 (×6): qty 0.5

## 2012-09-22 MED ORDER — SENNA 8.6 MG PO TABS
2.0000 | ORAL_TABLET | Freq: Once | ORAL | Status: AC
  Administered 2012-09-23: 17.2 mg via ORAL
  Filled 2012-09-22: qty 2

## 2012-09-22 NOTE — Progress Notes (Addendum)
[facility] PROGRESS NOTE  Cheryl [name]:[id number] DOB: 1956/01/15 DOA: 09/20/2012 PCP: Quitman Livings, MD  Assessment/Plan: *SOB (shortness of breath)/Bronchitis with bronchospasm  -CXR and CT chest unremarkable for acute findings  -continue bronchodilators, will continue solumedrol today as sill wheezing  - Will obtain BNP with no evidence of fluid retention, cardiac enzymes neg. - TSH is within normal limits, UDS is negative  -follow and eval for home O2 in am Active Problems:  Right upper lung lobe nodule  - noted on CT angio chest, recommendation is to repeat CT chest in 3 months to ensure stability  DM (diabetes mellitus)  -  check A1C is 7.2 - continue home medication regimen  Bipolar 1 disorder  - clinically stable  Hypothyroidism  -TSH is within normal limits as above and will continue home dose Synthroid  Weakness -PT recommending 24/7 assistance/supervision with no further PT follow up, TSH within normal limits as above  Code Status: Full Family Communication: Daughter at bedside Disposition Plan: To home when medically stable   Consultants:  None  Procedures:  None  Antibiotics:  none  HPI/Subjective: +still dyspneic, denies CP  Objective: Filed Vitals:   09/21/12 2228 09/22/12 0518 09/22/12 0839 09/22/12 0841  BP: 153/78 139/67  144/77  Pulse: 99 106  103  Temp: 97.2 F (36.2 C) 98.4 F (36.9 C)  97.8 F (36.6 C)  TempSrc: Oral Oral  Oral  Resp: 20 20    Height:      Weight:      SpO2: 93% 94% 94%     Intake/Output Summary (Last 24 hours) at 09/22/12 1233 Last data filed at 09/22/12 0900  Gross per 24 hour  Intake    900 ml  Output   4200 ml  Net  -3300 ml   Filed Weights   09/20/12 1942  Weight: 101.6 kg (223 lb 15.8 oz)    Exam:   General: Older female, acutely ill-appearing in no respiratory distress  Cardiovascular: Regular rate and rhythm, normal S1-S2  Respiratory: scattered wheezes bilaterally, no  crackles  Abdomen: Soft, bowel sounds present nontender nondistended no organomegaly and no masses palpable  Extremities: No cyanosis and no edema  Data Reviewed: Basic Metabolic Panel:  Lab 09/21/12 8119 09/20/12 1054  NA 138 136  K 3.8 4.3  CL 104 100  CO2 24 26  GLUCOSE 156* 190*  BUN 13 11  CREATININE 0.75 0.69  CALCIUM 8.6 8.9  MG -- --  PHOS -- --   Liver Function Tests:  Lab 09/20/12 1054  AST 34  ALT 37*  ALKPHOS 99  BILITOT 0.2*  PROT 6.9  ALBUMIN 3.5   No results found for this basename: LIPASE:5,AMYLASE:5 in the last 168 hours No results found for this basename: AMMONIA:5 in the last 168 hours CBC:  Lab 09/21/12 0541 09/20/12 1054  WBC 5.3 7.7  NEUTROABS -- 4.3  HGB 11.4* 12.3  HCT 35.2* 36.2  MCV 86.7 85.6  PLT 249 260   Cardiac Enzymes:  Lab 09/21/12 0951 09/20/12 1146  CKTOTAL -- --  CKMB -- --  CKMBINDEX -- --  TROPONINI <0.30 <0.30   BNP (last 3 results)  Basename 09/21/12 0951  PROBNP 6.9   CBG:  Lab 09/22/12 1134 09/22/12 0739 09/21/12 1633 09/21/12 1111 09/21/12 0738  GLUCAP 205* 183* 216* 157* 146*    Recent Results (from the past 240 hour(s))  URINE CULTURE     Status: Normal   Collection Time   09/20/12 12:07 PM  Component Value Range Status Comment   Specimen Description URINE, CLEAN CATCH   Final    Special Requests NONE   Final    Culture  Setup Time 09/20/2012 18:50   Final    Colony Count 35,000 COLONIES/ML   Final    Culture     Final    Value: GROUP B STREP(S.AGALACTIAE)ISOLATED     Note: TESTING AGAINST S. AGALACTIAE NOT ROUTINELY PERFORMED DUE TO PREDICTABILITY OF AMP/PEN/VAN SUSCEPTIBILITY.   Report Status 09/21/2012 FINAL   Final      Studies: Ct Angio Chest Pe W/cm &/or Wo Cm  09/20/2012  *RADIOLOGY REPORT*  Clinical Data: Rule out pulmonary embolus.  CT ANGIOGRAPHY CHEST  Technique:  Multidetector CT imaging of the chest using the standard protocol during bolus administration of intravenous contrast.  Multiplanar reconstructed images including MIPs were obtained and reviewed to evaluate the vascular anatomy.  Contrast: OMNIPAQUE IOHEXOL 350 MG/ML SOLN  Comparison: 11/17/2011  Findings: Lungs/pleura: No pleural effusion.  Nonspecific bronchial wall thickening is identified.  Patchy sub solid density within the peribronchovascular right upper lobe is identified, image 38.  This measures 1.1 cm.  Heart/Mediastinum: The heart size appears normal.  No pericardial effusion.  Prominent mediastinal lymph nodes are again identified. Low right paratracheal lymph node measures 1.1 cm, image 30.  This is compared with 1.3 cm previously. Right hilar lymph node measures 1.3 cm, image 36.  This is compared with 1.2 cm previously.  No abnormal filling defects within the pulmonary arteries to suggest an acute pulmonary emboli.  Upper abdomen: Mild diffuse low attenuation within the liver parenchyma.  Bones/Musculoskeletal:  No abnormalities identified.  IMPRESSION:  1.  No evidence for acute pulmonary embolus. 2.  Sub solid nodule in the right upper lobe is nonspecific and may be the sequela of inflammation or infection.  Follow-up examination in 3 months is recommended to assess for persistence. This recommendation follows the consensus statement: Recommendations for the Management of Subsolid Pulmonary Nodules Detected at CT:  A Statement from the Fleischner Society.  Radiology 2013; 266:304- 317. 3. Similar appearance of borderline enlarged mediastinal and hilar lymph nodes. 4.  Hepatic steatosis.   Original Report Authenticated By: Signa Kell, M.D.     Scheduled Meds:    . albuterol  2.5 mg Nebulization QID  . citalopram  40 mg Oral q morning - 10a  . clonazePAM  0.5 mg Oral TID  . dextromethorphan-guaiFENesin  1 tablet Oral BID  . docusate sodium  200 mg Oral BID  . enoxaparin (LOVENOX) injection  40 mg Subcutaneous Q24H  . gabapentin  600 mg Oral TID PC & HS  . insulin aspart  0-9 Units Subcutaneous  TID WC  . insulin aspart protamine-insulin aspart  15 Units Subcutaneous BID WC  . levothyroxine  150 mcg Oral QAC breakfast  . metFORMIN  500 mg Oral BID WC  . methylPREDNISolone (SOLU-MEDROL) injection  40 mg Intravenous Q8H  . multivitamin with minerals  1 tablet Oral q morning - 10a  . OLANZapine  20 mg Oral QHS  . pantoprazole  40 mg Oral Daily  . sodium chloride  3 mL Intravenous Q12H   Continuous Infusions:   Principal Problem:  *SOB (shortness of breath) Active Problems:  Bipolar 1 disorder  DM (diabetes mellitus)  Bronchitis, acute, with bronchospasm  Weakness    Time spent:    Kela Millin  [facility] Pager [phone]. If 8PM-8AM, please contact night-coverage at [email or web address], password [facility] 09/22/2012, 12:33 PM  LOS: 2 days

## 2012-09-22 NOTE — Evaluation (Signed)
Physical Therapy Evaluation Patient Details Name: Cheryl [name] MRN: [id number] DOB: 1955/12/02 Today's Date: 09/22/2012 Time: 7829-5621 PT Time Calculation (min): 14 min  PT Assessment / Plan / Recommendation Clinical Impression  Pt admitted for SOB (shortness of breath)/Bronchitis with bronchospasm.  Pt would benefit from acute PT services in order to improve independence with transfers and ambulation and increase activity tolerance prior to d/c home with daughter.    PT Assessment  Patient needs continued PT services    Follow Up Recommendations  Supervision/Assistance - 24 hour;No PT follow up    Does the patient have the potential to tolerate intense rehabilitation      Barriers to Discharge        Equipment Recommendations  None recommended by PT (if can find RW at home)    Recommendations for Other Services     Frequency Min 3X/week    Precautions / Restrictions Precautions Precautions: Fall Restrictions Weight Bearing Restrictions: No   Pertinent Vitals/Pain SaO2 room air at rest 92% SaO2 during ambulation 92-96% room air Reapplied 2L O2 [place] upon return to room due to dizziness BP 140/77 mmHg after sitting on bed      Mobility  Bed Mobility Bed Mobility: Supine to Sit Supine to Sit: 5: Supervision;HOB elevated Transfers Transfers: Stand to Sit;Sit to Stand Sit to Stand: 4: Min assist;With upper extremity assist;From bed Stand to Sit: 4: Min assist;With upper extremity assist;To bed Details for Transfer Assistance: assist to rise and control descent Ambulation/Gait Ambulation/Gait Assistance: 4: Min assist Ambulation Distance (Feet): 80 Feet Assistive device: Rolling walker Ambulation/Gait Assistance Details: daughter ambulated with pt as well for safety, daughter reports pt sometimes gets dizzy and lightheaded and passes out at home and in stores because she doesn't let her daughter know when she feels dizzy, recommended standing and marching in place prior to  ambulating and sitting back down if dizziness does not resolve.  pt ambulated on room air with SaO2 92% or greater however reapplied [place] once back in room due to dizziness.  verbal cues for safe use of RW Gait Pattern: Step-through pattern;Trunk flexed;Decreased stride length Gait velocity: decreased    Shoulder Instructions     Exercises     PT Diagnosis: Difficulty walking  PT Problem List: Decreased activity tolerance;Decreased mobility;Decreased safety awareness;Decreased knowledge of use of DME PT Treatment Interventions: DME instruction;Gait training;Stair training;Patient/family education;Functional mobility training;Therapeutic exercise;Therapeutic activities   PT Goals Acute Rehab PT Goals PT Goal Formulation: With patient Time For Goal Achievement: 09/29/12 Potential to Achieve Goals: Good Pt will go Sit to Stand: with modified independence PT Goal: Sit to Stand - Progress: Goal set today Pt will go Stand to Sit: with modified independence PT Goal: Stand to Sit - Progress: Goal set today Pt will Ambulate: 51 - 150 feet;with modified independence;with least restrictive assistive device PT Goal: Ambulate - Progress: Goal set today Pt will Go Up / Down Stairs: 1-2 stairs;with supervision;with least restrictive assistive device PT Goal: Up/Down Stairs - Progress: Goal set today Pt will Perform Home Exercise Program: with supervision, verbal cues required/provided PT Goal: Perform Home Exercise Program - Progress: Goal set today  Visit Information  Last PT Received On: 09/22/12 Assistance Needed: +2 (safety for ambulation)    Subjective Data  Subjective: I just feel real weak.   Prior Functioning  Home Living Lives With: Daughter Available Help at Discharge: Family Type of Home: House Home Access: Stairs to enter Secretary/administrator of Steps: 2 Entrance Stairs-Rails: Right Home Layout: One  level Home Adaptive Equipment: Walker - rolling Prior Function Level of  Independence: Independent Communication Communication: No difficulties    Cognition  Overall Cognitive Status: Appears within functional limits for tasks assessed/performed Arousal/Alertness: Awake/alert Orientation Level: Appears intact for tasks assessed Behavior During Session: [facility] for tasks performed    Extremity/Trunk Assessment Right Lower Extremity Assessment RLE ROM/Strength/Tone: [facility] for tasks assessed Left Lower Extremity Assessment LLE ROM/Strength/Tone: [facility] for tasks assessed   Balance    End of Session PT - End of Session Activity Tolerance: Patient limited by fatigue Patient left: in bed;with call bell/phone within reach;with family/visitor present;with bed alarm set  GP     [name],KATHrine E 09/22/2012, 10:35 AM Pager: [phone]

## 2012-09-22 NOTE — Care Management Note (Unsigned)
    Page 1 of 2   09/26/2012     6:01:28 PM   CARE MANAGEMENT NOTE 09/26/2012  Patient:  Cheryl [name], Cheryl [name]   Account Number:  [id number]  Date Initiated:  09/22/2012  Documentation initiated by:  Lanier Clam  Subjective/Objective Assessment:   ADMITTED W/SOB.BRONCHITIS.     Action/Plan:   FROM HOME W/KIDS.HAS PCP,PHARMACY.   Anticipated DC Date:  09/27/2012   Anticipated DC Plan:  HOME W HOME HEALTH SERVICES      DC Planning Services  CM consult      Choice offered to / List presented to:  C-1 Patient        HH arranged  HH-1 RN      [facility] agency  [facility]   Status of service:  In process, will continue to follow Medicare Important Message given?   (If response is "NO", the following Medicare IM given date fields will be blank) Date Medicare IM given:   Date Additional Medicare IM given:    Discharge Disposition:    Per UR Regulation:  Reviewed for med. necessity/level of care/duration of stay  If discussed at Long Length of Stay Meetings, dates discussed:    Comments:  09/26/12 Leightyn Cina RN,BSN NCM [phone] IF HOME 02 NEEDED CAN ARRANGE IF QUALIFIES.WILL NEED 02 SATS CHECKED & DOCUMENTED IN PROGRESS NOTES.  09/25/12 Marv Alfrey RN,BSN NCM [phone] [facility] CHOSEN-SUSAN(LIASON) INFORMED OF HHRN ORDER.COUGH,WHEEZE,NEBS,SOLUMEDROL TAPERING.  09/24/12 Kidus Delman RN,BSN NCM [phone] PATIENT W/DRY COUGH-IV ABX,IV SOLUMEDROL,NEBS.[facility] AGENCY LIST-RECOMMEND HHRN-DISEASE MGMNT,MEDS INSTRUCTION.  09/23/12 Michaelangelo Mittelman RN,BSN NCM [phone] PATIENT STATES SHE WILL USE HER DAUGHTER'S RW.PATIENT HAS FAMILY SUPPORT.PROVIDED W/PRIVATE SITTER LIST AS RESOURCE IF NEEDED.NOTED 02 SATS 93% ON RA W/AMBULATION.  09/22/12 Maks Cavallero RN,BSN NCM [phone] NOTED LOW SATS, IF HOME 02 NEEDED CAN ARRANGE.

## 2012-09-23 LAB — BASIC METABOLIC PANEL
Chloride: 98 mEq/L (ref 96–112)
Creatinine, Ser: 0.66 mg/dL (ref 0.50–1.10)
GFR calc Af Amer: >90 mL/min (ref >90)
Potassium: 4.2 mEq/L (ref 3.5–5.1)

## 2012-09-23 LAB — GLUCOSE, CAPILLARY: Glucose-Capillary: 240 mg/dL — ABNORMAL HIGH (ref 70–99)

## 2012-09-23 MED ORDER — LEVOFLOXACIN 500 MG PO TABS
500.0000 mg | ORAL_TABLET | Freq: Every day | ORAL | Status: DC
  Administered 2012-09-23 – 2012-09-27 (×5): 500 mg via ORAL
  Filled 2012-09-23 (×6): qty 1

## 2012-09-23 MED ORDER — PNEUMOCOCCAL VAC POLYVALENT 25 MCG/0.5ML IJ INJ
0.5000 mL | INJECTION | INTRAMUSCULAR | Status: AC
  Filled 2012-09-23 (×2): qty 0.5

## 2012-09-23 MED ORDER — PANTOPRAZOLE SODIUM 40 MG PO TBEC
40.0000 mg | DELAYED_RELEASE_TABLET | Freq: Two times a day (BID) | ORAL | Status: DC
  Administered 2012-09-24 – 2012-09-27 (×7): 40 mg via ORAL
  Filled 2012-09-23 (×9): qty 1

## 2012-09-23 MED ORDER — ALBUTEROL SULFATE HFA 108 (90 BASE) MCG/ACT IN AERS
2.0000 | INHALATION_SPRAY | RESPIRATORY_TRACT | Status: DC | PRN
Start: 2012-09-23 — End: 2012-09-24
  Filled 2012-09-23: qty 6.7

## 2012-09-23 MED ORDER — METHYLPREDNISOLONE SODIUM SUCC 40 MG IJ SOLR
40.0000 mg | Freq: Two times a day (BID) | INTRAMUSCULAR | Status: DC
  Administered 2012-09-24: 40 mg via INTRAVENOUS
  Filled 2012-09-23 (×3): qty 1

## 2012-09-23 MED ORDER — ALBUTEROL SULFATE HFA 108 (90 BASE) MCG/ACT IN AERS
2.0000 | INHALATION_SPRAY | Freq: Four times a day (QID) | RESPIRATORY_TRACT | Status: DC
  Administered 2012-09-24 (×2): 2 via RESPIRATORY_TRACT
  Filled 2012-09-23: qty 6.7

## 2012-09-23 NOTE — Progress Notes (Signed)
Inpatient Diabetes Program Recommendations  AACE/ADA: New Consensus Statement on Inpatient Glycemic Control (2013)  Target Ranges:  Prepandial:   less than 140 mg/dL      Peak postprandial:   less than 180 mg/dL (1-2 hours)      Critically ill patients:  140 - 180 mg/dL   Reason for Visit: Hyperglycemia  Results for Cheryl [name], Cheryl [name] (MRN [id number]) as of 09/23/2012 13:28  Ref. Range 09/20/2012 17:58  Hemoglobin A1C Latest Range: <5.7 % 7.2 (H)  Results for Cheryl [name], Cheryl [name] (MRN [id number]) as of 09/23/2012 13:28  Ref. Range 09/22/2012 11:34 09/22/2012 16:55 09/23/2012 07:23 09/23/2012 11:33  Glucose-Capillary Latest Range: 70-99 mg/dL 130 (H) 865 (H) 784 (H) 308 (H)    Inpatient Diabetes Program Recommendations Insulin - Basal: Increase 70/30 to 20 units bid Correction (SSI): Increase Novolog to resistant tidwc and hs while on steroids  Note: Hyperglycemia most likely d/t steroids.  Will follow.  Thank you. Ailene Ards, RD, LDN, CDE Inpatient Diabetes Coordinator

## 2012-09-23 NOTE — Progress Notes (Signed)
[facility] PROGRESS NOTE  Cheryl [name]:[id number] DOB: Aug 28, 1956 DOA: 09/20/2012 PCP: Quitman Livings, MD  Assessment/Plan: *SOB (shortness of breath)/Bronchitis with bronchospasm  -CXR and CT chest unremarkable for acute findings  - Will add abx, continue bronchodilators, also continue solumedrol today as still with wheezing, follow and change to prednisone  -BNP with no evidence of fluid retention, cardiac enzymes neg. - TSH is within normal limits, UDS is negative  - ambulatory pulse ox 93% on room air, does not meet criteria for home O2 Active Problems:  Right upper lung lobe nodule  - noted on CT angio chest, recommendation is to repeat CT chest in 3 months to ensure stability  -PLEASE NOTE PT NEEDS TO F/U WITH PCP FOR REPEAT CT IN . DM (diabetes mellitus)  -  check A1C is 7.2 - continue home medication regimen, cover with SSI  Bipolar 1 disorder  - clinically stable  Hypothyroidism  -TSH is within normal limits as above and will continue home dose Synthroid  Weakness -PT recommending 24/7 assistance/supervision with no further PT follow up, TSH within normal limits as above Dizziness -check orthostatics, follow Code Status: Full Family Communication: Daughter at bedside Disposition Plan: Possibly home in am if continues to improve   Consultants:  None  Procedures:  None  Antibiotics:  levaquin started today 1/14  HPI/Subjective: +states she feels better, but felt dizzy with standing today  Objective: Filed Vitals:   09/23/12 0800 09/23/12 1223 09/23/12 1225 09/23/12 1227  BP:  139/58 144/74 133/67  Pulse:  111 109 120  Temp:      TempSrc:      Resp:      Height:      Weight:      SpO2: 87%       Intake/Output Summary (Last 24 hours) at 09/23/12 1347 Last data filed at 09/23/12 1200  Gross per 24 hour  Intake   1800 ml  Output   3600 ml  Net  -1800 ml   Filed Weights   09/20/12 1942  Weight: 101.6 kg (223 lb 15.8 oz)     Exam:   General: Older female, acutely ill-appearing in no respiratory distress  Cardiovascular: Regular rate and rhythm, normal S1-S2  Respiratory: scattered wheezes bilaterally, no crackles  Abdomen: Soft, bowel sounds present nontender nondistended no organomegaly and no masses palpable  Extremities: No cyanosis and no edema  Data Reviewed: Basic Metabolic Panel:  Lab 09/23/12 9562 09/21/12 0541 09/20/12 1054  NA 135 138 136  K 4.2 3.8 4.3  CL 98 104 100  CO2 27 24 26   GLUCOSE 284* 156* 190*  BUN 18 13 11   CREATININE 0.66 0.75 0.69  CALCIUM 9.3 8.6 8.9  MG -- -- --  PHOS -- -- --   Liver Function Tests:  Lab 09/20/12 1054  AST 34  ALT 37*  ALKPHOS 99  BILITOT 0.2*  PROT 6.9  ALBUMIN 3.5   No results found for this basename: LIPASE:5,AMYLASE:5 in the last 168 hours No results found for this basename: AMMONIA:5 in the last 168 hours CBC:  Lab 09/21/12 0541 09/20/12 1054  WBC 5.3 7.7  NEUTROABS -- 4.3  HGB 11.4* 12.3  HCT 35.2* 36.2  MCV 86.7 85.6  PLT 249 260   Cardiac Enzymes:  Lab 09/21/12 0951 09/20/12 1146  CKTOTAL -- --  CKMB -- --  CKMBINDEX -- --  TROPONINI <0.30 <0.30   BNP (last 3 results)  Basename 09/21/12 0951  PROBNP 6.9   CBG:  Lab 09/23/12 1133 09/23/12 0723 09/22/12 1655 09/22/12 1134 09/22/12 0739  GLUCAP 308* 240* 288* 205* 183*    Recent Results (from the past 240 hour(s))  URINE CULTURE     Status: Normal   Collection Time   09/20/12 12:07 PM      Component Value Range Status Comment   Specimen Description URINE, CLEAN CATCH   Final    Special Requests NONE   Final    Culture  Setup Time 09/20/2012 18:50   Final    Colony Count 35,000 COLONIES/ML   Final    Culture     Final    Value: GROUP B STREP(S.AGALACTIAE)ISOLATED     Note: TESTING AGAINST S. AGALACTIAE NOT ROUTINELY PERFORMED DUE TO PREDICTABILITY OF AMP/PEN/VAN SUSCEPTIBILITY.   Report Status 09/21/2012 FINAL   Final      Studies: No results  found.  Scheduled Meds:    . albuterol  2.5 mg Nebulization Q4H WA  . citalopram  40 mg Oral q morning - 10a  . clonazePAM  0.5 mg Oral TID  . dextromethorphan-guaiFENesin  1 tablet Oral BID  . docusate sodium  200 mg Oral BID  . enoxaparin (LOVENOX) injection  40 mg Subcutaneous Q24H  . gabapentin  600 mg Oral TID PC & HS  . insulin aspart  0-9 Units Subcutaneous TID WC  . insulin aspart protamine-insulin aspart  15 Units Subcutaneous BID WC  . levofloxacin  500 mg Oral Daily  . levothyroxine  150 mcg Oral QAC breakfast  . metFORMIN  500 mg Oral BID WC  . methylPREDNISolone (SOLU-MEDROL) injection  40 mg Intravenous Q8H  . multivitamin with minerals  1 tablet Oral q morning - 10a  . OLANZapine  20 mg Oral QHS  . pantoprazole  40 mg Oral Daily  . sodium chloride  3 mL Intravenous Q12H   Continuous Infusions:   Principal Problem:  *SOB (shortness of breath) Active Problems:  Bipolar 1 disorder  DM (diabetes mellitus)  Bronchitis, acute, with bronchospasm  Weakness    Time spent:    Kela Millin  [facility] Pager [phone]. If 8PM-8AM, please contact night-coverage at [email or web address], password [facility] 09/23/2012, 1:47 PM  LOS: 3 days

## 2012-09-23 NOTE — Progress Notes (Signed)
Physical Therapy Treatment Patient Details Name: Cheryl [name] MRN: [id number] DOB: October 21, 1955 Today's Date: 09/23/2012 Time: 4098-1191 PT Time Calculation (min): 11 min  PT Assessment / Plan / Recommendation Comments on Treatment Session  Pt ambulated in hallway with encouragement due to fatigue.  SaO2 on room air 93% during ambulation.  Daughter reports she feels she can care for pt at home and has RW for pt to use.    Follow Up Recommendations  Supervision/Assistance - 24 hour;No PT follow up     Does the patient have the potential to tolerate intense rehabilitation     Barriers to Discharge        Equipment Recommendations  None recommended by PT    Recommendations for Other Services    Frequency     Plan Discharge plan remains appropriate;Frequency remains appropriate    Precautions / Restrictions Precautions Precautions: Fall Restrictions Weight Bearing Restrictions: No   Pertinent Vitals/Pain No pain reported.    Mobility  Bed Mobility Bed Mobility: Sit to Supine Sit to Supine: 5: Supervision Transfers Transfers: Stand to Sit;Sit to Stand Sit to Stand: 4: Min guard;From bed Stand to Sit: 4: Min guard;To bed Details for Transfer Assistance: verbal cues for technique Ambulation/Gait Ambulation/Gait Assistance: 4: Min assist Ambulation Distance (Feet): 120 Feet Assistive device: Rolling walker Ambulation/Gait Assistance Details: +2 for safety due to hx of dizziness with mobility, pt denied dizziness today but reports increased fatigue, SaO2 monitored during ambulation and remained 93% on room air and HR up to 127 bpm. Gait Pattern: Step-through pattern;Trunk flexed;Decreased stride length Gait velocity: decreased    Exercises     PT Diagnosis:    PT Problem List:   PT Treatment Interventions:     PT Goals Acute Rehab PT Goals PT Goal: Sit to Stand - Progress: Progressing toward goal PT Goal: Stand to Sit - Progress: Progressing toward goal PT Goal: Ambulate  - Progress: Progressing toward goal  Visit Information  Last PT Received On: 09/23/12 Assistance Needed: +2 (safety for ambulation)    Subjective Data  Subjective: I just want to get back in bed. (during ambulation)   Cognition  Overall Cognitive Status: Appears within functional limits for tasks assessed/performed Arousal/Alertness: Awake/alert Orientation Level: Appears intact for tasks assessed Behavior During Session: [facility] for tasks performed    Balance     End of Session PT - End of Session Activity Tolerance: Patient limited by fatigue Patient left: in bed;with call bell/phone within reach;with family/visitor present   GP     Allyne Hebert,KATHrine E 09/23/2012, 11:34 AM Pager: [phone]

## 2012-09-24 LAB — GLUCOSE, CAPILLARY
Glucose-Capillary: 140 mg/dL — ABNORMAL HIGH (ref 70–99)
Glucose-Capillary: 89 mg/dL (ref 70–99)
Glucose-Capillary: 180 mg/dL — ABNORMAL HIGH (ref 70–99)

## 2012-09-24 MED ORDER — INSULIN ASPART PROT & ASPART (70-30 MIX) 100 UNIT/ML ~~LOC~~ SUSP
20.0000 [IU] | Freq: Two times a day (BID) | SUBCUTANEOUS | Status: DC
  Administered 2012-09-24 – 2012-09-27 (×7): 20 [IU] via SUBCUTANEOUS
  Filled 2012-09-24: qty 3

## 2012-09-24 MED ORDER — LEVALBUTEROL HCL 0.63 MG/3ML IN NEBU
0.6300 mg | INHALATION_SOLUTION | Freq: Four times a day (QID) | RESPIRATORY_TRACT | Status: DC
  Administered 2012-09-24 – 2012-09-27 (×13): 0.63 mg via RESPIRATORY_TRACT
  Filled 2012-09-24 (×19): qty 3

## 2012-09-24 MED ORDER — INFLUENZA VIRUS VACC SPLIT PF IM SUSP
0.5000 mL | INTRAMUSCULAR | Status: AC
  Administered 2012-09-25: 0.5 mL via INTRAMUSCULAR
  Filled 2012-09-24 (×2): qty 0.5

## 2012-09-24 MED ORDER — METHYLPREDNISOLONE SODIUM SUCC 125 MG IJ SOLR
80.0000 mg | Freq: Three times a day (TID) | INTRAMUSCULAR | Status: DC
  Administered 2012-09-24 – 2012-09-25 (×3): 80 mg via INTRAVENOUS
  Filled 2012-09-24 (×6): qty 1.28

## 2012-09-24 MED ORDER — DM-GUAIFENESIN ER 30-600 MG PO TB12
2.0000 | ORAL_TABLET | Freq: Two times a day (BID) | ORAL | Status: DC
  Administered 2012-09-24 – 2012-09-25 (×3): 2 via ORAL
  Filled 2012-09-24 (×3): qty 2

## 2012-09-24 MED ORDER — IPRATROPIUM BROMIDE 0.02 % IN SOLN
0.5000 mg | RESPIRATORY_TRACT | Status: DC | PRN
  Filled 2012-09-24: qty 2.5

## 2012-09-24 MED ORDER — LEVALBUTEROL HCL 0.63 MG/3ML IN NEBU
0.6300 mg | INHALATION_SOLUTION | RESPIRATORY_TRACT | Status: DC | PRN
  Filled 2012-09-24: qty 3

## 2012-09-24 MED ORDER — INSULIN ASPART 100 UNIT/ML ~~LOC~~ SOLN
0.0000 [IU] | Freq: Three times a day (TID) | SUBCUTANEOUS | Status: DC
  Administered 2012-09-24 – 2012-09-25 (×3): 4 [IU] via SUBCUTANEOUS
  Administered 2012-09-25: 15 [IU] via SUBCUTANEOUS
  Administered 2012-09-26: 3 [IU] via SUBCUTANEOUS
  Administered 2012-09-26 (×2): 7 [IU] via SUBCUTANEOUS
  Administered 2012-09-27: 4 [IU] via SUBCUTANEOUS
  Administered 2012-09-27: 7 [IU] via SUBCUTANEOUS

## 2012-09-24 MED ORDER — IPRATROPIUM BROMIDE 0.02 % IN SOLN
0.5000 mg | Freq: Four times a day (QID) | RESPIRATORY_TRACT | Status: DC
  Administered 2012-09-24 – 2012-09-27 (×13): 0.5 mg via RESPIRATORY_TRACT
  Filled 2012-09-24 (×13): qty 2.5

## 2012-09-24 MED ORDER — METHYLPREDNISOLONE SODIUM SUCC 125 MG IJ SOLR
125.0000 mg | Freq: Three times a day (TID) | INTRAMUSCULAR | Status: DC
  Filled 2012-09-24 (×3): qty 2

## 2012-09-24 NOTE — Progress Notes (Signed)
[facility] PROGRESS NOTE  Cheryl [name] ZOX:[id number] DOB: 1955-12-13 DOA: 09/20/2012 PCP: Quitman Livings, MD  Assessment/Plan: #1 shortness of breath/acute bronchitis with bronchospasm Chest x-ray and CT scan negative for acute infiltrate. Patient with worsening wheezing per nursing. Patient still with a cough. ABG which was done on 09/21/2012 with a pH of 7.33, PCO2 of 50, PO2 of 60, bicarbonate of 25. Patient denies any tobacco use however patient has had secondhand exposure from her ex-boyfriend and daughter. Cardiac enzymes have been negative. BNP is within normal limits. Patient was started on oral Levaquin yesterday. Will place patient on scheduled Atrovent and Xopenex nebs. Will increase IV Solu-Medrol to 80 mg IV every 8 hours. Will increase Mucinex. Continue oxygen. Pulmonary toilet. Follow.  #2 right upper lung lobe nodule Per CT scan. Patient will need a repeat CT scan of followup with PCP in 3 months.  #3 type 2 diabetes Hemoglobin A1c 7.2. CBGs have ranged from 140 - 308. Patient on IV steroids. Will increase Novolin 70/30 to 20 units twice daily. Will discontinue metformin. Will change sliding scale insulin to resistant scale.  #4 bipolar disorder  stable. Continue Zyprexa.  #5 hypothyroidism TSH within normal limits. Continue home dose Synthroid.  #6 weakness Patient been seen by PT recommended 24 7 assistance/supervision with no further PT followup.  #7 dizziness Improved. Follow.  Code Status: Full Family Communication: Updated patient and daughter at bedside. Disposition Plan: Home when medically stable   Consultants:  None  Procedures:  CT chest 09/20/2012  Antibiotics:  Oral Levaquin 09/23/2012    HPI/Subjective: Patient per nursing with worsening wheezing and persistent cough. Patient states unable to bring up anything with the cough. Patient with some wheezing. Patient states had a bowel movement today. Per daughter patient with no tobacco  history however patient has been exposed to cigarette smoke for several years in the house.  Objective: Filed Vitals:   09/23/12 1917 09/23/12 2203 09/24/12 0526 09/24/12 0850  BP:  134/69 144/70   Pulse:  91 97   Temp:  97.3 F (36.3 C) 97.3 F (36.3 C)   TempSrc:  Oral Oral   Resp:  18 18   Height:      Weight:      SpO2: 91% 94% 94% 90%    Intake/Output Summary (Last 24 hours) at 09/24/12 1018 Last data filed at 09/24/12 0804  Gross per 24 hour  Intake    920 ml  Output   1401 ml  Net   -481 ml   Filed Weights   09/20/12 1942  Weight: 101.6 kg (223 lb 15.8 oz)    Exam:   General:  NAD  Cardiovascular: Tacycardia, RRR. No JVD, No LE edema  Respiratory: expiratory wheezing, some coarse BS. Some upper airway noise.  Abdomen: Soft/NT/ND/+BS  Data Reviewed: Basic Metabolic Panel:  Lab 09/23/12 8119 09/21/12 0541 09/20/12 1054  NA 135 138 136  K 4.2 3.8 4.3  CL 98 104 100  CO2 27 24 26   GLUCOSE 284* 156* 190*  BUN 18 13 11   CREATININE 0.66 0.75 0.69  CALCIUM 9.3 8.6 8.9  MG -- -- --  PHOS -- -- --   Liver Function Tests:  Lab 09/20/12 1054  AST 34  ALT 37*  ALKPHOS 99  BILITOT 0.2*  PROT 6.9  ALBUMIN 3.5   No results found for this basename: LIPASE:5,AMYLASE:5 in the last 168 hours No results found for this basename: AMMONIA:5 in the last 168 hours CBC:  Lab 09/21/12 0541  09/20/12 1054  WBC 5.3 7.7  NEUTROABS -- 4.3  HGB 11.4* 12.3  HCT 35.2* 36.2  MCV 86.7 85.6  PLT 249 260   Cardiac Enzymes:  Lab 09/21/12 0951 09/20/12 1146  CKTOTAL -- --  CKMB -- --  CKMBINDEX -- --  TROPONINI <0.30 <0.30   BNP (last 3 results)  Basename 09/21/12 0951  PROBNP 6.9   CBG:  Lab 09/24/12 0726 09/23/12 1632 09/23/12 1133 09/23/12 0723 09/22/12 1655  GLUCAP 140* 245* 308* 240* 288*    Recent Results (from the past 240 hour(s))  URINE CULTURE     Status: Normal   Collection Time   09/20/12 12:07 PM      Component Value Range Status Comment    Specimen Description URINE, CLEAN CATCH   Final    Special Requests NONE   Final    Culture  Setup Time 09/20/2012 18:50   Final    Colony Count 35,000 COLONIES/ML   Final    Culture     Final    Value: GROUP B STREP(S.AGALACTIAE)ISOLATED     Note: TESTING AGAINST S. AGALACTIAE NOT ROUTINELY PERFORMED DUE TO PREDICTABILITY OF AMP/PEN/VAN SUSCEPTIBILITY.   Report Status 09/21/2012 FINAL   Final      Studies: No results found.  Scheduled Meds:   . citalopram  40 mg Oral q morning - 10a  . clonazePAM  0.5 mg Oral TID  . dextromethorphan-guaiFENesin  2 tablet Oral BID  . docusate sodium  200 mg Oral BID  . enoxaparin (LOVENOX) injection  40 mg Subcutaneous Q24H  . gabapentin  600 mg Oral TID PC & HS  . insulin aspart  0-9 Units Subcutaneous TID WC  . insulin aspart protamine-insulin aspart  20 Units Subcutaneous BID WC  . ipratropium  0.5 mg Nebulization Q6H  . levalbuterol  0.63 mg Nebulization Q6H  . levofloxacin  500 mg Oral Daily  . levothyroxine  150 mcg Oral QAC breakfast  . metFORMIN  500 mg Oral BID WC  . methylPREDNISolone (SOLU-MEDROL) injection  80 mg Intravenous Q8H  . multivitamin with minerals  1 tablet Oral q morning - 10a  . OLANZapine  20 mg Oral QHS  . pantoprazole  40 mg Oral BID AC  . pneumococcal 23 valent vaccine  0.5 mL Intramuscular Tomorrow-1000  . sodium chloride  3 mL Intravenous Q12H   Continuous Infusions:   Principal Problem:  *SOB (shortness of breath) Active Problems:  Bipolar 1 disorder  DM (diabetes mellitus)  Bronchitis, acute, with bronchospasm  Weakness    Time spent:  >35 mins    [facility]  [facility] Pager [phone]. If 8PM-8AM, please contact night-coverage at [email or web address], password [facility] 09/24/2012, 10:18 AM  LOS: 4 days

## 2012-09-25 LAB — CBC
Platelets: 316 10*3/uL (ref 150–400)
RBC: 4.32 MIL/uL (ref 3.87–5.11)
RDW: 12.3 % (ref 11.5–15.5)
WBC: 10.6 10*3/uL — ABNORMAL HIGH (ref 4.0–10.5)

## 2012-09-25 LAB — GLUCOSE, CAPILLARY

## 2012-09-25 LAB — BASIC METABOLIC PANEL
Calcium: 9 mg/dL (ref 8.4–10.5)
GFR calc non Af Amer: >90 mL/min (ref >90)
Sodium: 138 mEq/L (ref 135–145)

## 2012-09-25 MED ORDER — METHYLPREDNISOLONE SODIUM SUCC 125 MG IJ SOLR
80.0000 mg | Freq: Two times a day (BID) | INTRAMUSCULAR | Status: DC
  Administered 2012-09-25 – 2012-09-26 (×2): 80 mg via INTRAVENOUS
  Filled 2012-09-25 (×4): qty 1.28

## 2012-09-25 MED ORDER — MENTHOL 3 MG MT LOZG
1.0000 | LOZENGE | OROMUCOSAL | Status: DC | PRN
  Filled 2012-09-25: qty 9

## 2012-09-25 MED ORDER — BENZONATATE 100 MG PO CAPS: 100.0000 mg | ORAL_CAPSULE | Freq: Three times a day (TID) | ORAL | Status: DC

## 2012-09-25 MED ORDER — BENZONATATE 100 MG PO CAPS
200.0000 mg | ORAL_CAPSULE | Freq: Three times a day (TID) | ORAL | Status: DC
  Administered 2012-09-25 – 2012-09-27 (×6): 200 mg via ORAL
  Filled 2012-09-25 (×8): qty 2

## 2012-09-25 NOTE — Progress Notes (Signed)
[facility] PROGRESS NOTE  Cheryl [name] AVW:[id number] DOB: 09/23/1955 DOA: 09/20/2012 PCP: Quitman Livings, MD  Assessment/Plan: #1 shortness of breath/acute bronchitis with bronchospasm Chest x-ray and CT scan negative for acute infiltrate. Patient with worsening wheezing per nursing. Patient still with a cough. ABG which was done on 09/21/2012 with a pH of 7.33, PCO2 of 50, PO2 of 60, bicarbonate of 25. Patient denies any tobacco use however patient has had secondhand exposure from her ex-boyfriend and daughter. Cardiac enzymes have been negative. BNP is within normal limits. Patient was started on oral Levaquin. Continue scheduled Atrovent and Xopenex nebs. Will decrease IV Solu-Medrol to 80 mg IV every 12 hours. D/C Mucinex. Add Lawyer. Continue oxygen. Pulmonary toilet. Follow.  #2 right upper lung lobe nodule Per CT scan. Patient will need a repeat CT scan of followup with PCP in 3 months.  #3 type 2 diabetes Hemoglobin A1c 7.2. CBGs have ranged from 180 - 237. Patient on IV steroids. Continue Novolin 70/30  20 units twice daily. Continue resistant sliding scale. CBG should improve with steroid taper.   #4 bipolar disorder  stable. Continue Zyprexa.  #5 hypothyroidism TSH within normal limits. Continue home dose Synthroid.  #6 weakness Patient been seen by PT recommended 24 7 assistance/supervision with no further PT followup.  #7 dizziness Improved. Follow.  Code Status: Full Family Communication: Updated patient and daughter at bedside. Disposition Plan: Home when medically stable   Consultants:  None  Procedures:  CT chest 09/20/2012  Antibiotics:  Oral Levaquin 09/23/2012    HPI/Subjective: Patient sitting up in bed eating her lunch. She states shortness of breath has improved. Patient states no change in her cough.  Objective: Filed Vitals:   09/24/12 2150 09/25/12 0135 09/25/12 0544 09/25/12 0800  BP: 151/77  108/85   Pulse: 101  111   Temp:  98 F (36.7 C)  97.7 F (36.5 C)   TempSrc: Oral  Oral   Resp: 20  18   Height:      Weight:      SpO2: 93% 88% 93% 92%    Intake/Output Summary (Last 24 hours) at 09/25/12 1202 Last data filed at 09/25/12 0900  Gross per 24 hour  Intake    840 ml  Output    701 ml  Net    139 ml   Filed Weights   09/20/12 1942  Weight: 101.6 kg (223 lb 15.8 oz)    Exam:   General:  NAD  Cardiovascular: Tachycardia, RRR. No JVD, No LE edema  Respiratory: decreasing expiratory wheezing, some coarse BS. Some upper airway noise.  Abdomen: Soft/NT/ND/+BS  Data Reviewed: Basic Metabolic Panel:  Lab 09/25/12 8295 09/23/12 0453 09/21/12 0541 09/20/12 1054  NA 138 135 138 136  K 4.2 4.2 3.8 4.3  CL 97 98 104 100  CO2 29 27 24 26   GLUCOSE 241* 284* 156* 190*  BUN 23 18 13 11   CREATININE 0.71 0.66 0.75 0.69  CALCIUM 9.0 9.3 8.6 8.9  MG -- -- -- --  PHOS -- -- -- --   Liver Function Tests:  Lab 09/20/12 1054  AST 34  ALT 37*  ALKPHOS 99  BILITOT 0.2*  PROT 6.9  ALBUMIN 3.5   No results found for this basename: LIPASE:5,AMYLASE:5 in the last 168 hours No results found for this basename: AMMONIA:5 in the last 168 hours CBC:  Lab 09/25/12 0430 09/21/12 0541 09/20/12 1054  WBC 10.6* 5.3 7.7  NEUTROABS -- -- 4.3  HGB 12.6  11.4* 12.3  HCT 37.2 35.2* 36.2  MCV 86.1 86.7 85.6  PLT 316 249 260   Cardiac Enzymes:  Lab 09/21/12 0951 09/20/12 1146  CKTOTAL -- --  CKMB -- --  CKMBINDEX -- --  TROPONINI <0.30 <0.30   BNP (last 3 results)  Basename 09/21/12 0951  PROBNP 6.9   CBG:  Lab 09/25/12 0756 09/24/12 2127 09/24/12 1639 09/24/12 1141 09/24/12 0726  GLUCAP 175* 237* 180* 89 140*    Recent Results (from the past 240 hour(s))  URINE CULTURE     Status: Normal   Collection Time   09/20/12 12:07 PM      Component Value Range Status Comment   Specimen Description URINE, CLEAN CATCH   Final    Special Requests NONE   Final    Culture  Setup Time 09/20/2012 18:50    Final    Colony Count 35,000 COLONIES/ML   Final    Culture     Final    Value: GROUP B STREP(S.AGALACTIAE)ISOLATED     Note: TESTING AGAINST S. AGALACTIAE NOT ROUTINELY PERFORMED DUE TO PREDICTABILITY OF AMP/PEN/VAN SUSCEPTIBILITY.   Report Status 09/21/2012 FINAL   Final      Studies: No results found.  Scheduled Meds:    . benzonatate  100 mg Oral TID  . citalopram  40 mg Oral q morning - 10a  . clonazePAM  0.5 mg Oral TID  . dextromethorphan-guaiFENesin  2 tablet Oral BID  . docusate sodium  200 mg Oral BID  . enoxaparin (LOVENOX) injection  40 mg Subcutaneous Q24H  . gabapentin  600 mg Oral TID PC & HS  . insulin aspart  0-20 Units Subcutaneous TID WC  . insulin aspart protamine-insulin aspart  20 Units Subcutaneous BID WC  . ipratropium  0.5 mg Nebulization Q6H  . levalbuterol  0.63 mg Nebulization Q6H  . levofloxacin  500 mg Oral Daily  . levothyroxine  150 mcg Oral QAC breakfast  . methylPREDNISolone (SOLU-MEDROL) injection  80 mg Intravenous Q12H  . multivitamin with minerals  1 tablet Oral q morning - 10a  . OLANZapine  20 mg Oral QHS  . pantoprazole  40 mg Oral BID AC  . sodium chloride  3 mL Intravenous Q12H   Continuous Infusions:   Principal Problem:  *SOB (shortness of breath) Active Problems:  Bipolar 1 disorder  DM (diabetes mellitus)  Bronchitis, acute, with bronchospasm  Weakness    Time spent:  >35 mins    [facility]  [facility] Pager [phone]. If 8PM-8AM, please contact night-coverage at [email or web address], password [facility] 09/25/2012, 12:02 PM  LOS: 5 days

## 2012-09-26 LAB — CBC
HCT: 36.4 % (ref 36.0–46.0)
Hemoglobin: 12.3 g/dL (ref 12.0–15.0)
MCHC: 33.8 g/dL (ref 30.0–36.0)
MCV: 85.6 fL (ref 78.0–100.0)
RDW: 12.6 % (ref 11.5–15.5)
WBC: 14.8 10*3/uL — ABNORMAL HIGH (ref 4.0–10.5)

## 2012-09-26 LAB — BASIC METABOLIC PANEL
BUN: 23 mg/dL (ref 6–23)
Chloride: 99 mEq/L (ref 96–112)
Creatinine, Ser: 0.72 mg/dL (ref 0.50–1.10)
GFR calc Af Amer: >90 mL/min (ref >90)
Glucose, Bld: 233 mg/dL — ABNORMAL HIGH (ref 70–99)

## 2012-09-26 MED ORDER — METHYLPREDNISOLONE SODIUM SUCC 125 MG IJ SOLR
60.0000 mg | Freq: Every day | INTRAMUSCULAR | Status: DC
  Administered 2012-09-27: 60 mg via INTRAVENOUS
  Filled 2012-09-26: qty 0.96

## 2012-09-26 NOTE — Progress Notes (Signed)
[facility] PROGRESS NOTE  Cheryl PECKENPAUGH NWG:[id number] DOB: [birth year] DOA: 09/20/2012 PCP: Quitman Livings, MD  Assessment/Plan: #1 shortness of breath/acute bronchitis with bronchospasm/ ?? Copd exac Chest x-ray and CT scan negative for acute infiltrate. Patient with improving wheezing per nursing. Patient still with a cough. ABG which was done on 09/21/2012 with a pH of 7.33, PCO2 of 50, PO2 of 60, bicarbonate of 25. Patient denies any tobacco use however patient has had secondhand exposure from her ex-boyfriend and daughter. Cardiac enzymes have been negative. BNP is within normal limits. Patient was started on oral Levaquin. Continue scheduled Atrovent and Xopenex nebs. Will decrease IV Solu-Medrol to 60 mg IV daily. Continue Tessalon Perles. Continue oxygen. Pulmonary toilet. Follow.  #2 right upper lung lobe nodule Per CT scan. Patient will need a repeat CT scan of followup with PCP in 3 months.  #3 type 2 diabetes Hemoglobin A1c 7.2. CBGs have ranged from 129 - 332. Patient on IV steroids. Continue Novolin 70/30  20 units twice daily. Continue resistant sliding scale. CBG should improve with steroid taper.   #4 bipolar disorder  stable. Continue Zyprexa.  #5 hypothyroidism TSH within normal limits. Continue home dose Synthroid.  #6 weakness Patient been seen by PT recommended 24 7 assistance/supervision with no further PT followup.  #7 dizziness Improved. Follow.  Code Status: Full Family Communication: Updated patient and daughter at bedside. Disposition Plan: Home when medically stable   Consultants:  None  Procedures:  CT chest 09/20/2012  Antibiotics:  Oral Levaquin 09/23/2012    HPI/Subjective: Patient states she's feeling better. Still with a cough. Patient with sats of 88-89% per nursing.  Objective: Filed Vitals:   09/25/12 2219 09/26/12 0148 09/26/12 0456 09/26/12 0945  BP: 141/76  123/74   Pulse: 84  70   Temp: 97.8 F (36.6 C)  97.6 F (36.4  C)   TempSrc: Oral  Oral   Resp: 20  18   Height:      Weight:      SpO2: 94% 93% 96% 89%    Intake/Output Summary (Last 24 hours) at 09/26/12 1053 Last data filed at 09/26/12 0756  Gross per 24 hour  Intake   1500 ml  Output   2000 ml  Net   -500 ml   Filed Weights   09/20/12 1942  Weight: 101.6 kg (223 lb 15.8 oz)    Exam:   General:  NAD  Cardiovascular: Tachycardia, RRR. No JVD, No LE edema  Respiratory: decreasing expiratory wheezing, some coarse BS. Some upper airway noise.  Abdomen: Soft/NT/ND/+BS  Data Reviewed: Basic Metabolic Panel:  Lab 09/26/12 5784 09/25/12 0430 09/23/12 0453 09/21/12 0541 09/20/12 1054  NA 137 138 135 138 136  K 4.0 4.2 4.2 3.8 4.3  CL 99 97 98 104 100  CO2 28 29 27 24 26   GLUCOSE 233* 241* 284* 156* 190*  BUN 23 23 18 13 11   CREATININE 0.72 0.71 0.66 0.75 0.69  CALCIUM 9.4 9.0 9.3 8.6 8.9  MG -- -- -- -- --  PHOS -- -- -- -- --   Liver Function Tests:  Lab 09/20/12 1054  AST 34  ALT 37*  ALKPHOS 99  BILITOT 0.2*  PROT 6.9  ALBUMIN 3.5   No results found for this basename: LIPASE:5,AMYLASE:5 in the last 168 hours No results found for this basename: AMMONIA:5 in the last 168 hours CBC:  Lab 09/26/12 0425 09/25/12 0430 09/21/12 0541 09/20/12 1054  WBC 14.8* 10.6* 5.3 7.7  NEUTROABS -- -- --  4.3  HGB 12.3 12.6 11.4* 12.3  HCT 36.4 37.2 35.2* 36.2  MCV 85.6 86.1 86.7 85.6  PLT 339 316 249 260   Cardiac Enzymes:  Lab 09/21/12 0951 09/20/12 1146  CKTOTAL -- --  CKMB -- --  CKMBINDEX -- --  TROPONINI <0.30 <0.30   BNP (last 3 results)  Basename 09/21/12 0951  PROBNP 6.9   CBG:  Lab 09/26/12 0742 09/25/12 2148 09/25/12 1643 09/25/12 1218 09/25/12 0756  GLUCAP 129* 332* 159* 318* 175*    Recent Results (from the past 240 hour(s))  URINE CULTURE     Status: Normal   Collection Time   09/20/12 12:07 PM      Component Value Range Status Comment   Specimen Description URINE, CLEAN CATCH   Final    Special  Requests NONE   Final    Culture  Setup Time 09/20/2012 18:50   Final    Colony Count 35,000 COLONIES/ML   Final    Culture     Final    Value: GROUP B STREP(S.AGALACTIAE)ISOLATED     Note: TESTING AGAINST S. AGALACTIAE NOT ROUTINELY PERFORMED DUE TO PREDICTABILITY OF AMP/PEN/VAN SUSCEPTIBILITY.   Report Status 09/21/2012 FINAL   Final      Studies: No results found.  Scheduled Meds:    . benzonatate  200 mg Oral TID  . citalopram  40 mg Oral q morning - 10a  . clonazePAM  0.5 mg Oral TID  . docusate sodium  200 mg Oral BID  . enoxaparin (LOVENOX) injection  40 mg Subcutaneous Q24H  . gabapentin  600 mg Oral TID PC & HS  . insulin aspart  0-20 Units Subcutaneous TID WC  . insulin aspart protamine-insulin aspart  20 Units Subcutaneous BID WC  . ipratropium  0.5 mg Nebulization Q6H  . levalbuterol  0.63 mg Nebulization Q6H  . levofloxacin  500 mg Oral Daily  . levothyroxine  150 mcg Oral QAC breakfast  . methylPREDNISolone (SOLU-MEDROL) injection  80 mg Intravenous Q12H  . multivitamin with minerals  1 tablet Oral q morning - 10a  . OLANZapine  20 mg Oral QHS  . pantoprazole  40 mg Oral BID AC  . sodium chloride  3 mL Intravenous Q12H   Continuous Infusions:   Principal Problem:  *SOB (shortness of breath) Active Problems:  Bipolar 1 disorder  DM (diabetes mellitus)  Bronchitis, acute, with bronchospasm  Weakness    Time spent:  >35 mins    [facility]  [facility] Pager [phone]. If 8PM-8AM, please contact night-coverage at [email or web address], password [facility] 09/26/2012, 10:53 AM  LOS: 6 days

## 2012-09-26 NOTE — Progress Notes (Signed)
PT Cancellation Note  Patient Details Name: NERIAH BROTT MRN: [id number] DOB: [birth year]   Cancelled Treatment:    Reason Eval/Treat Not Completed: Fatigue/lethargy limiting ability to participate RN reports ambulating with pt earlier today however.   Jance Siek,KATHrine E 09/26/2012, 2:47 PM Pager: [phone]

## 2012-09-27 LAB — CBC
Hemoglobin: 13.4 g/dL (ref 12.0–15.0)
MCH: 28.8 pg (ref 26.0–34.0)
MCV: 86.7 fL (ref 78.0–100.0)
RBC: 4.66 MIL/uL (ref 3.87–5.11)

## 2012-09-27 LAB — BASIC METABOLIC PANEL
CO2: 30 mEq/L (ref 19–32)
Calcium: 9 mg/dL (ref 8.4–10.5)
Creatinine, Ser: 0.84 mg/dL (ref 0.50–1.10)
Glucose, Bld: 118 mg/dL — ABNORMAL HIGH (ref 70–99)
Sodium: 138 mEq/L (ref 135–145)

## 2012-09-27 LAB — GLUCOSE, CAPILLARY: Glucose-Capillary: 171 mg/dL — ABNORMAL HIGH (ref 70–99)

## 2012-09-27 MED ORDER — ALBUTEROL SULFATE HFA 108 (90 BASE) MCG/ACT IN AERS: 2.0000 | INHALATION_SPRAY | Freq: Four times a day (QID) | RESPIRATORY_TRACT | Status: DC | PRN

## 2012-09-27 MED ORDER — PREDNISONE 20 MG PO TABS: 60.0000 mg | ORAL_TABLET | Freq: Every day | ORAL | Status: DC

## 2012-09-27 MED ORDER — LEVOFLOXACIN 500 MG PO TABS: 500.0000 mg | ORAL_TABLET | Freq: Every day | ORAL | Status: DC

## 2012-09-27 MED ORDER — PREDNISONE 50 MG PO TABS
60.0000 mg | ORAL_TABLET | Freq: Every day | ORAL | Status: DC
  Filled 2012-09-27: qty 1

## 2012-09-27 MED ORDER — BENZONATATE 200 MG PO CAPS: 200.0000 mg | ORAL_CAPSULE | Freq: Three times a day (TID) | ORAL | Status: DC

## 2012-09-27 MED ORDER — PANTOPRAZOLE SODIUM 40 MG PO TBEC: 40.0000 mg | DELAYED_RELEASE_TABLET | Freq: Every day | ORAL | Status: DC

## 2012-09-27 NOTE — Progress Notes (Signed)
During X /A neb tx, [place], CNA advised me (at 0940) to leave PT on RA post neb tx- his plan to ambulate PT on RA. Please disregard documentation included with neb tx- as RT will not be placing PT on 2 lpm [place].

## 2012-09-27 NOTE — Discharge Summary (Signed)
Physician Discharge Summary  Cheryl [name] ZOX:[id number] DOB: [birth year] DOA: 09/20/2012  PCP: Quitman Livings, MD  Admit date: 09/20/2012 Discharge date: 09/27/2012  Time spent: 65 minutes  Recommendations for Outpatient Follow-up:  1. Patient is to follow up with PCP one week post discharge. On follow up patient may benefit from pulmonary function studies to rule out COPD. Patient will also need a repeat CT scan done in about 3 months to follow up on nodule noted on CT scan done during this hospitalization.  Discharge Diagnoses:  Principal Problem:  *SOB (shortness of breath) Active Problems:  Bipolar 1 disorder  DM (diabetes mellitus)  Bronchitis, acute, with bronchospasm  Weakness   Discharge Condition: stable and improved  Diet recommendation: carb modified diet  Filed Weights   09/20/12 1942  Weight: 101.6 kg (223 lb 15.8 oz)    History of present illness:  Pt is 57 yo female who presents to [facility] ED with main concern of progressively worsening shortness of breath that initially started 3-4 days prior to admission and associated with productive cough of clear sputum generalized weakness, poor oral intake. Pt reports similar events in the past when she was diagnosed with PNA. She denies chest pain or fevers, chills, no other abdominal or urinary concerns. Pt denies any specific focal neurological weakness, no headaches, no visual changes.  In ED, pt found to be hypoxic with saturations in high 80's and low 90's. CT chest negative for PE or PNA. [facility] asked to admit for observations.      Hospital Course:  #1 shortness of breath/acute bronchitis with bronchospasm/ ?? Copd exac  Patient had presented to the ED with progressive worsening shortness of breath for 3-4 days with a productive cough generalized weakness and poor oral intake. Patient was admitted to the telemetry floor where she was monitored. In the ED patient was noted to be hypoxic with sats in the high 80s and low 90s. Chest  x-ray which was done was negative for any acute infiltrate. CT scan of the chest which was done was negative for acute infiltrate or PE. However did note a solid nodule in the right upper lobe which will need a repeat CT scan followup in 3 months. Patient was monitored patient was noted to have increased wheezing with her cough. A blood gas was done on 09/21/2012 which showed a pH of 7.33, PCO2 of 50, PO2 of 60, bicarbonate of 25. Patient denied any tobacco use however patient has had significant secondhand exposure to tobacco. Patient was subsequently started on oral Levaquin, IV steroid taper, mucolytic and Tessalon Perles. Patient was also placed on scheduled nebs and pulmonary toilet. Patient improved clinically on a daily basis, steroids were tapered down and patient will be discharged home in stable and improved condition on 4 more days of Levaquin, steroid taper, an albuterol inhaler as needed as well as a PPI. Patient will need to follow up with PCP one week post discharge. Patient may benefit from a pulmonary referral or PFTs. Patient will be discharged in stable and improved condition. #2 right upper lung lobe nodule  Per CT scan. Patient will need a repeat CT scan and followup with PCP in 3 months.  #3 type 2 diabetes  Hemoglobin A1c 7.2. Patient was maintained on Novolin 70/30 20 units twice daily. As well as a sliding scale insulin. Patient will follow up with PCP as outpatient.   The rest of patient's chronic medical issues remain stable throughout the hospitalization and patient be discharged in stable  and improved condition.   Procedures: CT chest 09/20/2012   Consultations:  None  Discharge Exam: Filed Vitals:   09/26/12 1521 09/26/12 2132 09/27/12 0515 09/27/12 0936  BP: 169/63 108/57 121/66   Pulse: 82 80 92   Temp: 98 F (36.7 C) 98 F (36.7 C) 98 F (36.7 C)   TempSrc: Oral Oral Oral   Resp: 20 18 18    Height:      Weight:      SpO2: 91% 91% 92% 93%    General:  NAD Cardiovascular: RRR Respiratory: CTAB  Discharge Instructions  Discharge Orders    Future Orders Please Complete By Expires   Diet Carb Modified      Increase activity slowly      Discharge instructions      Comments:   Follow up with [facility], MD in 1 week       Medication List     As of 09/27/2012 10:21 AM    TAKE these medications         albuterol 108 (90 BASE) MCG/ACT inhaler   Commonly known as: PROVENTIL HFA;VENTOLIN HFA   Inhale 2 puffs into the lungs every 6 (six) hours as needed for wheezing. Use 2 puffs every 8 hours x 4 days, then use every 6 hours as needed      benzonatate 200 MG capsule   Commonly known as: TESSALON   Take 1 capsule (200 mg total) by mouth 3 (three) times daily. Take for 4 days then stop.      citalopram 40 MG tablet   Commonly known as: CELEXA   Take 40 mg by mouth every morning.      clonazePAM 0.5 MG tablet   Commonly known as: KLONOPIN   Take 0.5 mg by mouth 3 (three) times daily.      docusate sodium 100 MG capsule   Commonly known as: COLACE   Take 200 mg by mouth 2 (two) times daily.      gabapentin 600 MG tablet   Commonly known as: NEURONTIN   Take 600 mg by mouth 4 (four) times daily.      HYDROcodone-acetaminophen 7.5-325 MG per tablet   Commonly known as: NORCO   Take 1 tablet by mouth every 6 (six) hours as needed. Pain      insulin NPH-insulin regular (70-30) 100 UNIT/ML injection   Commonly known as: NOVOLIN 70/30   Inject 20-26 Units into the skin 2 (two) times daily. Take 26 units at 7am and 20 units and 7pm      levofloxacin 500 MG tablet   Commonly known as: LEVAQUIN   Take 1 tablet (500 mg total) by mouth daily. Take for 4 days then stop.      levothyroxine 150 MCG tablet   Commonly known as: SYNTHROID, LEVOTHROID   Take 150 mcg by mouth daily before breakfast.      metFORMIN 500 MG tablet   Commonly known as: GLUCOPHAGE   Take 500 mg by mouth 2 (two) times daily with a meal.      multivitamin  with minerals Tabs   Take 1 tablet by mouth every morning.      OLANZapine 20 MG tablet   Commonly known as: ZYPREXA   Take 20 mg by mouth at bedtime.      pantoprazole 40 MG tablet   Commonly known as: PROTONIX   Take 1 tablet (40 mg total) by mouth daily.      predniSONE 20 MG tablet  Commonly known as: DELTASONE   Take 3 tablets (60 mg total) by mouth daily before breakfast. Take 3 tablets ( 60mg ) daily x 2 days, then 2 tablets ( 40mg ) daily x 3 days, then 1 tablet ( 20mg ) daily x 3 days then stop.   Start taking on: 09/28/2012           Follow-up Information    Follow up with [facility], MD. Schedule an appointment as soon as possible for a visit in 1 week. (f/u with PCP in 1 week)    Contact information:   [address] [place] [address] [phone]           The results of significant diagnostics from this hospitalization (including imaging, microbiology, ancillary and laboratory) are listed below for reference.    Significant Diagnostic Studies: Dg Chest 2 View  09/20/2012  *RADIOLOGY REPORT*  Clinical Data: Short of breath.  Body aches.  CHEST - 2 VIEW  Comparison: 11/22/2011.  Findings: Mild atelectasis is present along the minor fissure.  No airspace disease.  No effusion.  Left basilar atelectasis. Cardiopericardial silhouette and mediastinal contours are within normal limits. Monitoring leads are projected over the chest.  IMPRESSION: Scattered areas of atelectasis.  No acute cardiopulmonary disease.   Original Report Authenticated By: Andreas Newport, M.D.    Ct Angio Chest Pe W/cm &/or Wo Cm  09/20/2012  *RADIOLOGY REPORT*  Clinical Data: Rule out pulmonary embolus.  CT ANGIOGRAPHY CHEST  Technique:  Multidetector CT imaging of the chest using the standard protocol during bolus administration of intravenous contrast. Multiplanar reconstructed images including MIPs were obtained and reviewed to evaluate the vascular anatomy.  Contrast: OMNIPAQUE  IOHEXOL 350 MG/ML SOLN  Comparison: 11/17/2011  Findings: Lungs/pleura: No pleural effusion.  Nonspecific bronchial wall thickening is identified.  Patchy sub solid density within the peribronchovascular right upper lobe is identified, image 38.  This measures 1.1 cm.  Heart/Mediastinum: The heart size appears normal.  No pericardial effusion.  Prominent mediastinal lymph nodes are again identified. Low right paratracheal lymph node measures 1.1 cm, image 30.  This is compared with 1.3 cm previously. Right hilar lymph node measures 1.3 cm, image 36.  This is compared with 1.2 cm previously.  No abnormal filling defects within the pulmonary arteries to suggest an acute pulmonary emboli.  Upper abdomen: Mild diffuse low attenuation within the liver parenchyma.  Bones/Musculoskeletal:  No abnormalities identified.  IMPRESSION:  1.  No evidence for acute pulmonary embolus. 2.  Sub solid nodule in the right upper lobe is nonspecific and may be the sequela of inflammation or infection.  Follow-up examination in 3 months is recommended to assess for persistence. This recommendation follows the consensus statement: Recommendations for the Management of Subsolid Pulmonary Nodules Detected at CT:  A Statement from the Fleischner Society.  Radiology 2013; 266:304- 317. 3. Similar appearance of borderline enlarged mediastinal and hilar lymph nodes. 4.  Hepatic steatosis.   Original Report Authenticated By: Signa Kell, M.D.     Microbiology: Recent Results (from the past 240 hour(s))  URINE CULTURE     Status: Normal   Collection Time   09/20/12 12:07 PM      Component Value Range Status Comment   Specimen Description URINE, CLEAN CATCH   Final    Special Requests NONE   Final    Culture  Setup Time 09/20/2012 18:50   Final    Colony Count 35,000 COLONIES/ML   Final    Culture  Final    Value: GROUP B STREP(S.AGALACTIAE)ISOLATED     Note: TESTING AGAINST S. AGALACTIAE NOT ROUTINELY PERFORMED DUE TO  PREDICTABILITY OF AMP/PEN/VAN SUSCEPTIBILITY.   Report Status 09/21/2012 FINAL   Final      Labs: Basic Metabolic Panel:  Lab 09/27/12 1610 09/26/12 0425 09/25/12 0430 09/23/12 0453 09/21/12 0541  NA 138 137 138 135 138  K 3.9 4.0 4.2 4.2 3.8  CL 99 99 97 98 104  CO2 30 28 29 27 24   GLUCOSE 118* 233* 241* 284* 156*  BUN 23 23 23 18 13   CREATININE 0.84 0.72 0.71 0.66 0.75  CALCIUM 9.0 9.4 9.0 9.3 8.6  MG -- -- -- -- --  PHOS -- -- -- -- --   Liver Function Tests:  Lab 09/20/12 1054  AST 34  ALT 37*  ALKPHOS 99  BILITOT 0.2*  PROT 6.9  ALBUMIN 3.5   No results found for this basename: LIPASE:5,AMYLASE:5 in the last 168 hours No results found for this basename: AMMONIA:5 in the last 168 hours CBC:  Lab 09/27/12 0510 09/26/12 0425 09/25/12 0430 09/21/12 0541 09/20/12 1054  WBC 15.8* 14.8* 10.6* 5.3 7.7  NEUTROABS -- -- -- -- 4.3  HGB 13.4 12.3 12.6 11.4* 12.3  HCT 40.4 36.4 37.2 35.2* 36.2  MCV 86.7 85.6 86.1 86.7 85.6  PLT 381 339 316 249 260   Cardiac Enzymes:  Lab 09/21/12 0951 09/20/12 1146  CKTOTAL -- --  CKMB -- --  CKMBINDEX -- --  TROPONINI <0.30 <0.30   BNP: BNP (last 3 results)  Basename 09/21/12 0951  PROBNP 6.9   CBG:  Lab 09/27/12 0732 09/26/12 2129 09/26/12 1648 09/26/12 1138 09/26/12 0742  GLUCAP 171* 143* 209* 240* 129*       Signed:  Sabrena Gavitt  [facility] 09/27/2012, 10:21 AM

## 2012-11-12 ENCOUNTER — Other Ambulatory Visit: Payer: Self-pay

## 2012-11-12 DIAGNOSIS — Z1231 Encounter for screening mammogram for malignant neoplasm of breast: Secondary | ICD-10-CM

## 2012-11-25 ENCOUNTER — Ambulatory Visit (HOSPITAL_COMMUNITY): Payer: Medicare Other | Admitting: Psychiatry

## 2012-12-05 ENCOUNTER — Ambulatory Visit (HOSPITAL_COMMUNITY): Payer: Self-pay | Admitting: Psychiatry

## 2012-12-12 ENCOUNTER — Ambulatory Visit (HOSPITAL_COMMUNITY): Payer: Self-pay | Admitting: Psychiatry

## 2012-12-12 ENCOUNTER — Emergency Department (HOSPITAL_COMMUNITY)
Admission: EM | Admit: 2012-12-12 | Discharge: 2012-12-13 | Disposition: A | Discharge status: Other Acute Inpatient Hospital | Payer: Medicare Other | Attending: Emergency Medicine | Admitting: Emergency Medicine

## 2012-12-12 ENCOUNTER — Encounter (HOSPITAL_COMMUNITY): Payer: Self-pay | Admitting: Emergency Medicine

## 2012-12-12 DIAGNOSIS — Z79899 Other long term (current) drug therapy: Secondary | ICD-10-CM | POA: Insufficient documentation

## 2012-12-12 DIAGNOSIS — F329 Major depressive disorder, single episode, unspecified: Secondary | ICD-10-CM | POA: Insufficient documentation

## 2012-12-12 DIAGNOSIS — F411 Generalized anxiety disorder: Secondary | ICD-10-CM | POA: Insufficient documentation

## 2012-12-12 DIAGNOSIS — E119 Type 2 diabetes mellitus without complications: Secondary | ICD-10-CM | POA: Insufficient documentation

## 2012-12-12 DIAGNOSIS — F39 Unspecified mood [affective] disorder: Secondary | ICD-10-CM | POA: Insufficient documentation

## 2012-12-12 DIAGNOSIS — Z8709 Personal history of other diseases of the respiratory system: Secondary | ICD-10-CM | POA: Insufficient documentation

## 2012-12-12 DIAGNOSIS — Z8739 Personal history of other diseases of the musculoskeletal system and connective tissue: Secondary | ICD-10-CM | POA: Insufficient documentation

## 2012-12-12 DIAGNOSIS — G479 Sleep disorder, unspecified: Secondary | ICD-10-CM | POA: Insufficient documentation

## 2012-12-12 DIAGNOSIS — F489 Nonpsychotic mental disorder, unspecified: Secondary | ICD-10-CM | POA: Insufficient documentation

## 2012-12-12 DIAGNOSIS — Z8701 Personal history of pneumonia (recurrent): Secondary | ICD-10-CM | POA: Insufficient documentation

## 2012-12-12 DIAGNOSIS — E039 Hypothyroidism, unspecified: Secondary | ICD-10-CM | POA: Insufficient documentation

## 2012-12-12 DIAGNOSIS — Z794 Long term (current) use of insulin: Secondary | ICD-10-CM | POA: Insufficient documentation

## 2012-12-12 DIAGNOSIS — R45851 Suicidal ideations: Secondary | ICD-10-CM | POA: Insufficient documentation

## 2012-12-12 DIAGNOSIS — Z86718 Personal history of other venous thrombosis and embolism: Secondary | ICD-10-CM | POA: Insufficient documentation

## 2012-12-12 DIAGNOSIS — F319 Bipolar disorder, unspecified: Secondary | ICD-10-CM

## 2012-12-12 DIAGNOSIS — F3289 Other specified depressive episodes: Secondary | ICD-10-CM | POA: Insufficient documentation

## 2012-12-12 LAB — COMPREHENSIVE METABOLIC PANEL
Alkaline Phosphatase: 99 U/L (ref 39–117)
BUN: 9 mg/dL (ref 6–23)
CO2: 25 mEq/L (ref 19–32)
Chloride: 96 mEq/L (ref 96–112)
Creatinine, Ser: 0.66 mg/dL (ref 0.50–1.10)
GFR calc Af Amer: >90 mL/min (ref >90)
GFR calc non Af Amer: >90 mL/min (ref >90)
Glucose, Bld: 274 mg/dL — ABNORMAL HIGH (ref 70–99)
Potassium: 4.3 mEq/L (ref 3.5–5.1)
Total Bilirubin: 0.1 mg/dL — ABNORMAL LOW (ref 0.3–1.2)

## 2012-12-12 LAB — GLUCOSE, CAPILLARY: Glucose-Capillary: 221 mg/dL — ABNORMAL HIGH (ref 70–99)

## 2012-12-12 LAB — RAPID URINE DRUG SCREEN, HOSP PERFORMED
Barbiturates: NOT DETECTED
Benzodiazepines: NOT DETECTED

## 2012-12-12 LAB — CBC
MCV: 86.3 fL (ref 78.0–100.0)
Platelets: 354 10*3/uL (ref 150–400)
RBC: 4.09 MIL/uL (ref 3.87–5.11)
WBC: 7.9 10*3/uL (ref 4.0–10.5)

## 2012-12-12 LAB — SALICYLATE LEVEL: Salicylate Lvl: <2 mg/dL — ABNORMAL LOW (ref 2.8–20.0)

## 2012-12-12 LAB — ETHANOL: Alcohol, Ethyl (B): <11 mg/dL (ref 0–11)

## 2012-12-12 MED ORDER — GABAPENTIN 300 MG PO CAPS
600.0000 mg | ORAL_CAPSULE | Freq: Four times a day (QID) | ORAL | Status: DC
  Administered 2012-12-12 – 2012-12-13 (×3): 600 mg via ORAL
  Filled 2012-12-12 (×6): qty 2

## 2012-12-12 MED ORDER — INSULIN ASPART PROT & ASPART (70-30 MIX) 100 UNIT/ML ~~LOC~~ SUSP: 70.0000 [IU] | Freq: Every day | SUBCUTANEOUS | Status: DC

## 2012-12-12 MED ORDER — CLONAZEPAM 0.5 MG PO TABS
0.5000 mg | ORAL_TABLET | Freq: Three times a day (TID) | ORAL | Status: DC
  Administered 2012-12-12 – 2012-12-13 (×3): 0.5 mg via ORAL
  Filled 2012-12-12 (×3): qty 1

## 2012-12-12 MED ORDER — ALBUTEROL SULFATE HFA 108 (90 BASE) MCG/ACT IN AERS: 2.0000 | INHALATION_SPRAY | Freq: Four times a day (QID) | RESPIRATORY_TRACT | Status: DC | PRN

## 2012-12-12 MED ORDER — LEVOTHYROXINE SODIUM 150 MCG PO TABS
150.0000 ug | ORAL_TABLET | Freq: Every day | ORAL | Status: DC
  Administered 2012-12-13: 150 ug via ORAL
  Filled 2012-12-12 (×2): qty 1

## 2012-12-12 MED ORDER — METFORMIN HCL 500 MG PO TABS
500.0000 mg | ORAL_TABLET | Freq: Two times a day (BID) | ORAL | Status: DC
  Administered 2012-12-12 – 2012-12-13 (×2): 500 mg via ORAL
  Filled 2012-12-12 (×4): qty 1

## 2012-12-12 MED ORDER — SIMVASTATIN 10 MG PO TABS
10.0000 mg | ORAL_TABLET | Freq: Every day | ORAL | Status: DC
  Filled 2012-12-12: qty 1

## 2012-12-12 MED ORDER — INSULIN ASPART PROT & ASPART (70-30 MIX) 100 UNIT/ML ~~LOC~~ SUSP
20.0000 [IU] | Freq: Every day | SUBCUTANEOUS | Status: DC
  Administered 2012-12-12: 20 [IU] via SUBCUTANEOUS
  Filled 2012-12-12: qty 10

## 2012-12-12 MED ORDER — INSULIN ASPART PROT & ASPART (70-30 MIX) 100 UNIT/ML ~~LOC~~ SUSP
26.0000 [IU] | Freq: Every day | SUBCUTANEOUS | Status: DC
  Administered 2012-12-13: 26 [IU] via SUBCUTANEOUS
  Filled 2012-12-12: qty 10

## 2012-12-12 MED ORDER — POLYETHYLENE GLYCOL 3350 17 G PO PACK
17.0000 g | PACK | Freq: Every morning | ORAL | Status: DC
  Filled 2012-12-12: qty 2

## 2012-12-12 MED ORDER — FLUTICASONE PROPIONATE 50 MCG/ACT NA SUSP
1.0000 | Freq: Every day | NASAL | Status: DC | PRN
  Filled 2012-12-12: qty 16

## 2012-12-12 MED ORDER — DOCUSATE SODIUM 100 MG PO CAPS
100.0000 mg | ORAL_CAPSULE | Freq: Two times a day (BID) | ORAL | Status: DC
  Administered 2012-12-12 – 2012-12-13 (×3): 100 mg via ORAL
  Filled 2012-12-12 (×4): qty 1

## 2012-12-12 MED ORDER — HYDROCODONE-ACETAMINOPHEN 7.5-325 MG PO TABS
1.0000 | ORAL_TABLET | Freq: Four times a day (QID) | ORAL | Status: DC | PRN
  Administered 2012-12-13: 1 via ORAL
  Filled 2012-12-12: qty 1

## 2012-12-12 MED ORDER — GABAPENTIN 600 MG PO TABS: 600.0000 mg | ORAL_TABLET | Freq: Four times a day (QID) | ORAL | Status: DC

## 2012-12-12 MED ORDER — OLANZAPINE 5 MG PO TABS
10.0000 mg | ORAL_TABLET | Freq: Every day | ORAL | Status: DC
  Administered 2012-12-13: 10 mg via ORAL
  Filled 2012-12-12: qty 2

## 2012-12-12 MED ORDER — CITALOPRAM HYDROBROMIDE 40 MG PO TABS
40.0000 mg | ORAL_TABLET | Freq: Every morning | ORAL | Status: DC
  Administered 2012-12-13: 40 mg via ORAL
  Filled 2012-12-12: qty 1

## 2012-12-12 MED ORDER — DICYCLOMINE HCL 20 MG PO TABS
20.0000 mg | ORAL_TABLET | Freq: Every morning | ORAL | Status: DC
  Administered 2012-12-13: 20 mg via ORAL
  Filled 2012-12-12: qty 1

## 2012-12-12 NOTE — ED Provider Notes (Signed)
History     CSN: [id number]  Arrival date & time 12/12/12  1201   First MD Initiated Contact with Patient 12/12/12 1219      Chief Complaint  Patient presents with  . Medical Clearance    (Consider location/radiation/quality/duration/timing/severity/associated sxs/prior treatment) The history is provided by the patient.   patient has a history of schizophrenia and bipolar disorder. She states her depression has been doing worse. She states she has become suicidal the last few weeks. She says her depression has been going for a few months. She was previously seen at [facility] by her primary care doctor has been giving her her medications recently. She has active suicidal thoughts and that she would cut herself. She has previously had suicide attempt. She denies substance abuse. She states she's had a good appetite.  Past Medical History  Diagnosis Date  . Thyroid disease   . Diabetes mellitus   . Depression   . Schizophrenia   . Bipolar affective   . Suicidal ideation   . Anxiety   . Hypothyroidism   . Recurrent upper respiratory infection (URI)   . Pneumonia     11/2011  . Arthritis     back, hands  . Shortness of breath     with exertion  . Headache   . Blood transfusion   . History of DVT of lower extremity     left, after pregnancy    Past Surgical History  Procedure Laterality Date  . Appendectomy    . Tubal ligation    . Esophagogastroduodenoscopy  11/23/2011    Procedure: ESOPHAGOGASTRODUODENOSCOPY (EGD);  Surgeon: Freddy Jaksch, MD;  Location: [facility];  Service: Endoscopy;  Laterality: N/A;    Family History  Problem Relation Age of Onset  . Diabetes Mother   . Lung cancer Father   . Heart failure Father     History  Substance Use Topics  . Smoking status: Never Smoker   . Smokeless tobacco: Never Used  . Alcohol Use: No    OB History   Grav Para Term Preterm Abortions TAB SAB Ect Mult Living                  Review of Systems   Constitutional: Negative for activity change and appetite change.  HENT: Negative for neck stiffness.   Eyes: Negative for pain.  Respiratory: Negative for chest tightness and shortness of breath.   Cardiovascular: Negative for chest pain and leg swelling.  Gastrointestinal: Negative for nausea, vomiting, abdominal pain and diarrhea.  Genitourinary: Negative for flank pain.  Musculoskeletal: Negative for back pain.  Skin: Negative for rash.  Neurological: Negative for weakness, numbness and headaches.  Psychiatric/Behavioral: Positive for suicidal ideas, sleep disturbance, dysphoric mood and decreased concentration. Negative for behavioral problems and self-injury. The patient is not nervous/anxious.     Allergies  Sulfa antibiotics  Home Medications   Current Outpatient Rx  Name  Route  Sig  Dispense  Refill  . albuterol (PROVENTIL HFA;VENTOLIN HFA) 108 (90 BASE) MCG/ACT inhaler   Inhalation   Inhale 2 puffs into the lungs every 6 (six) hours as needed for wheezing.         . citalopram (CELEXA) 40 MG tablet   Oral   Take 40 mg by mouth every morning.         . clonazePAM (KLONOPIN) 0.5 MG tablet   Oral   Take 0.5 mg by mouth 3 (three) times daily.         Marland Kitchen  dicyclomine (BENTYL) 20 MG tablet   Oral   Take 20 mg by mouth every morning.         . docusate sodium (COLACE) 100 MG capsule   Oral   Take 100 mg by mouth 2 (two) times daily.          . fluticasone (FLONASE) 50 MCG/ACT nasal spray   Nasal   Place 2 sprays into the nose daily as needed for allergies.         Marland Kitchen gabapentin (NEURONTIN) 600 MG tablet   Oral   Take 600 mg by mouth 4 (four) times daily.          Marland Kitchen HYDROcodone-acetaminophen (NORCO) 7.5-325 MG per tablet   Oral   Take 1 tablet by mouth every 6 (six) hours as needed. Pain         . insulin NPH-insulin regular (NOVOLIN 70/30) (70-30) 100 UNIT/ML injection   Subcutaneous   Inject 20-26 Units into the skin 2 (two) times daily. He  takes 26 units at 7am and 20 units at 7pm.         . levothyroxine (SYNTHROID, LEVOTHROID) 150 MCG tablet   Oral   Take 150 mcg by mouth daily before breakfast.          . lidocaine (LIDODERM) 5 %   Transdermal   Place 1 patch onto the skin daily as needed (For pain.). Remove & Discard patch within 12 hours or as directed by MD         . lovastatin (MEVACOR) 20 MG tablet   Oral   Take 20 mg by mouth every morning.         . metFORMIN (GLUCOPHAGE) 500 MG tablet   Oral   Take 500 mg by mouth 2 (two) times daily with a meal.         . Multiple Vitamin (MULITIVITAMIN WITH MINERALS) TABS   Oral   Take 1 tablet by mouth daily with lunch.          . OLANZapine (ZYPREXA) 10 MG tablet   Oral   Take 10 mg by mouth at bedtime.         . polyethylene glycol (MIRALAX / GLYCOLAX) packet   Oral   Take 17-34 g by mouth every morning.            BP 125/71  Pulse 100  Temp(Src) 98.5 F (36.9 C) (Oral)  Resp 20  SpO2 94%  Physical Exam  Nursing note and vitals reviewed. Constitutional: She is oriented to person, place, and time. She appears well-developed and well-nourished.  HENT:  Head: Normocephalic and atraumatic.  Eyes: EOM are normal. Pupils are equal, round, and reactive to light.  Neck: Normal range of motion. Neck supple.  Cardiovascular: Normal rate, regular rhythm and normal heart sounds.   No murmur heard. Pulmonary/Chest: Effort normal and breath sounds normal. No respiratory distress. She has no wheezes. She has no rales.  Abdominal: Soft. Bowel sounds are normal. She exhibits no distension. There is no tenderness. There is no rebound and no guarding.  Musculoskeletal: Normal range of motion.  Neurological: She is alert and oriented to person, place, and time. No cranial nerve deficit.  Skin: Skin is warm and dry.  Psychiatric: Her speech is normal.  Patient appears  depressed    ED Course  Procedures (including critical care time)  Labs Reviewed   CBC - Abnormal; Notable for the following:    Hemoglobin 11.9 (*)  HCT 35.3 (*)    All other components within normal limits  COMPREHENSIVE METABOLIC PANEL - Abnormal; Notable for the following:    Sodium 133 (*)    Glucose, Bld 274 (*)    ALT 45 (*)    Total Bilirubin 0.1 (*)    All other components within normal limits  SALICYLATE LEVEL - Abnormal; Notable for the following:    Salicylate Lvl <2.0 (*)    All other components within normal limits  ACETAMINOPHEN LEVEL  ETHANOL  URINE RAPID DRUG SCREEN (HOSP PERFORMED)  TSH   No results found.   1. Bipolar 1 disorder   2. Suicidal ideation       MDM  Patient has worsening depression and suicidal. She appears him medically cleared at this time. She'll be seen by ACT team. Home medicines have been written for. She is actively suicidal and should probably not be discharged without further consultation.        Juliet Rude. Rubin Payor, MD 12/12/12 1535

## 2012-12-12 NOTE — ED Notes (Signed)
Called [facility] to confirm paperwork was received via fax

## 2012-12-12 NOTE — Progress Notes (Signed)
Call back from [facility] at [facility]: patient has been accepted by Dr. Seth Bake to room [phone]. Number for report [phone].

## 2012-12-12 NOTE — ED Provider Notes (Signed)
Patient accepted at [facility] under Dr. Seth Bake. Stable for transfer   Richardean Canal, MD 12/12/12 2123

## 2012-12-12 NOTE — ED Notes (Signed)
Aaox3.  Pt sitting up in bed watching tv.  Pt denies SI/HI at this time.  Pt reports "i thing my celexa needs to be changed.  i have been taking my meds and still feeling depressed."  Pt reports living with her daughter.  Pt denies any contributing factors to depression.

## 2012-12-12 NOTE — ED Notes (Addendum)
Patient c/o increased depression, suicidal ideations, and a plan to stab herself.  Patient denies HI.  Patient is currently taking Celexa, Klonopin, and Zyprexa and wants her meds changed.

## 2012-12-12 NOTE — BH Assessment (Addendum)
Assessment Note   Cheryl [name] is an 57 y.o. female. Patient has a history of schizophrenia and Bipolar Disorder. She reports depression has been on- going for a few months. However, her depression has worsened over the past 2 weeks. Patient sts she has a plan to stabb herself or overdose. She has access to means (sharps and RX medications). She is is unable to contract for safety. Patient also reports 2 prior suicide attempts; both overdoses. Patient does not identify any stressors. She sts, "I just don't feel like myself". She reports increased sleeping (frequent naps during the day), hopelessness, fatigue, and isolating self from others. Patient [facility] at [facility] due to prior suicide attempts and psychotic symptoms. She does not have a current outpatient provider, however; has an appointment next Friday with a psychiatrist she calls "Dr. Demetrius Charity".  She denies HI, AVH's, and no alcohol/drug use reported.       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 Thyroid disease   . Diabetes mellitus   . Depression   . Schizophrenia   . Bipolar affective   . Suicidal ideation   . Anxiety   . Hypothyroidism   . Recurrent upper respiratory infection (URI)   . Pneumonia     11/2011  . Arthritis     back, hands  . Shortness of breath     with exertion  . Headache   . Blood transfusion   . History of DVT of lower extremity     left, after pregnancy   Axis IV: other psychosocial or environmental problems, problems related to social environment, problems with access to health care services and problems with primary support group Axis V: 31-40 impairment in reality testing  Past Medical History:  Past Medical History  Diagnosis Date  . Thyroid disease   . Diabetes mellitus   . Depression   . Schizophrenia   . Bipolar affective   . Suicidal ideation   . Anxiety   . Hypothyroidism   . Recurrent upper  respiratory infection (URI)   . Pneumonia     11/2011  . Arthritis     back, hands  . Shortness of breath     with exertion  . Headache   . Blood transfusion   . History of DVT of lower extremity     left, after pregnancy    Past Surgical History  Procedure Laterality Date  . Appendectomy    . Tubal ligation    . Esophagogastroduodenoscopy  11/23/2011    Procedure: ESOPHAGOGASTRODUODENOSCOPY (EGD);  Surgeon: Freddy Jaksch, MD;  Location: [facility];  Service: Endoscopy;  Laterality: N/A;    Family History:  Family History  Problem Relation Age of Onset  . Diabetes Mother   . Lung cancer Father   . Heart failure Father     Social History:  reports that she has never smoked. She has never used smokeless tobacco. She reports that she does not drink alcohol or use illicit drugs.  Additional Social History:     CIWA: CIWA-Ar BP: 125/71 mmHg Pulse Rate: 100 Nausea and Vomiting: no nausea and no vomiting Tactile Disturbances: none Tremor: no tremor Auditory Disturbances: not present Paroxysmal Sweats: no sweat visible Visual Disturbances: not present Anxiety: no anxiety, at ease Headache, Fullness in Head: none present Agitation: normal activity Orientation and Clouding of Sensorium: oriented and can do serial additions CIWA-Ar Total: 0 COWS:  Allergies:  Allergies  Allergen Reactions  . Sulfa Antibiotics Rash    Home Medications:  (Not in a hospital admission)  OB/GYN Status:  No LMP recorded. Patient is postmenopausal.  General Assessment Data Location of Assessment: [facility] Living Arrangements: Other (Comment);Children (lives with adult daughter) Can pt return to current living arrangement?: Yes Admission Status: Voluntary Is patient capable of signing voluntary admission?: Yes Transfer from: Acute Hospital Referral Source: Self/Family/Friend     Risk to self Suicidal Ideation: Yes-Currently Present Suicidal Intent: Yes-Currently Present Is  patient at risk for suicide?: Yes Suicidal Plan?: Yes-Currently Present Specify Current Suicidal Plan:  (overdose or stabb self) Access to Means: Yes Specify Access to Suicidal Means:  (access to prescription medications and sharps) What has been your use of drugs/alcohol within the last 12 months?:  (patient denies alcohol and drug use) Previous Attempts/Gestures: Yes How many times?:  (2x's-overdoses) Other Self Harm Risks:  (none reported) Triggers for Past Attempts: Other (Comment) (patient unable to provide any details; stating, "depression") Intentional Self Injurious Behavior: None Family Suicide History: Yes (brother commited suicide by shooting himself) Recent stressful life event(s): Other (Comment) ("I don't feel like myself") Persecutory voices/beliefs?: No Depression: Yes Depression Symptoms: Loss of interest in usual pleasures;Feeling worthless/self pity;Feeling angry/irritable;Guilt;Fatigue;Tearfulness;Insomnia;Despondent;Isolating Substance abuse history and/or treatment for substance abuse?: No Suicide prevention information given to non-admitted patients: Not applicable  Risk to Others Homicidal Ideation: No Thoughts of Harm to Others: No Current Homicidal Intent: No Current Homicidal Plan: No Access to Homicidal Means: No Identified Victim:  (n/a) History of harm to others?: No Assessment of Violence: None Noted Violent Behavior Description:  (patient currently calm and cooperative) Does patient have access to weapons?: No Criminal Charges Pending?: No Does patient have a court date: No  Psychosis Hallucinations: None noted Delusions: None noted  Mental Status Report Appear/Hygiene: Disheveled Eye Contact: Good Motor Activity: Unremarkable Speech: Logical/coherent Level of Consciousness: Crying Mood: Depressed Affect: Appropriate to circumstance Anxiety Level: None Thought Processes: Coherent;Relevant Judgement: Unimpaired Orientation:  Person;Place;Time;Situation Obsessive Compulsive Thoughts/Behaviors: None  Cognitive Functioning Concentration: Decreased Memory: Recent Intact;Remote Intact IQ: Average Insight: Poor Impulse Control: Fair Appetite: Good Weight Loss:  (none reported) Weight Gain:  (none reported) Sleep: Increased Total Hours of Sleep:  (10 or more hours per night; multiple naps) Vegetative Symptoms: None  ADLScreening [facility] Assessment Services) Patient's cognitive ability adequate to safely complete daily activities?: Yes Patient able to express need for assistance with ADLs?: Yes Independently performs ADLs?: Yes (appropriate for developmental age)  Abuse/Neglect [facility]) Physical Abuse: Denies Verbal Abuse: Denies Sexual Abuse: Denies  Prior Inpatient Therapy Prior Inpatient Therapy: Yes Prior Therapy Dates:  (patient unable to recall dates of admissions) Prior Therapy Facilty/Provider(s):  ([facility]) Reason for Treatment:  (depression and suicidal )  Prior Outpatient Therapy Prior Outpatient Therapy: Yes Prior Therapy Dates:  (First appointment next Friday) Prior Therapy Facilty/Provider(s):  (Psychiatrist; Patient only knows the doctor as "Dr. Demetrius Charity") Reason for Treatment:  (medication management)  ADL Screening (condition at time of admission) Patient's cognitive ability adequate to safely complete daily activities?: Yes Patient able to express need for assistance with ADLs?: Yes Independently performs ADLs?: Yes (appropriate for developmental age) Weakness of Legs: None Weakness of Arms/Hands: None  Home Assistive Devices/Equipment Home Assistive Devices/Equipment: None  Therapy Consults (therapy consults require a physician order) PT Evaluation Needed: No OT Evalulation Needed: No SLP Evaluation Needed: No Abuse/Neglect Assessment (Assessment to be complete while patient is alone) Physical Abuse: Denies Verbal Abuse: Denies  Sexual Abuse: Denies Exploitation of  patient/patient's resources: Denies Self-Neglect: Denies Values / Beliefs Cultural Requests During Hospitalization: None Spiritual Requests During Hospitalization: None Consults Spiritual Care Consult Needed: No Social Work Consult Needed: No Merchant navy officer (For Healthcare) Advance Directive: Patient does not have advance directive Nutrition Screen- [facility] Patient's home diet: Regular Have you recently lost weight without trying?: No Have you been eating poorly because of a decreased appetite?: No Malnutrition Screening Tool Score: 0  Additional Information 1:1 In Past 12 Months?: Yes CIRT Risk: No Elopement Risk: No Does patient have medical clearance?: No     Disposition:  Disposition Initial Assessment Completed for this Encounter: Yes Disposition of Patient: Inpatient treatment program Type of inpatient treatment program: Adult  On Site Evaluation by:   Reviewed with Physician:     Melynda Ripple [facility] 12/12/2012 4:43 PM

## 2012-12-13 LAB — TSH: TSH: 1.273 u[IU]/mL (ref 0.350–4.500)

## 2012-12-13 LAB — GLUCOSE, CAPILLARY
Glucose-Capillary: 183 mg/dL — ABNORMAL HIGH (ref 70–99)
Glucose-Capillary: 194 mg/dL — ABNORMAL HIGH (ref 70–99)

## 2012-12-13 NOTE — ED Notes (Signed)
Resting quielty, nad, sheriff to transport shortly

## 2012-12-13 NOTE — ED Notes (Signed)
Sheriff called and will transport around 1000

## 2012-12-13 NOTE — ED Notes (Signed)
Up to the desk on the phone 

## 2012-12-13 NOTE — ED Notes (Signed)
Sheriff is here to transport-[facility], transport report, IVC, emtela, face sheet, satchel, and 1 bag of belongings sent w/ pt.  Snack given to sheriff for transport.

## 2012-12-13 NOTE — ED Provider Notes (Signed)
Pt resting, nad. Vitals normal. Anticipate transfer to [facility].   Suzi Roots, MD 12/13/12 [phone]

## 2012-12-13 NOTE — ED Notes (Signed)
Pt ambulatory w/ sheriff, family is aware that she is being transferred and that she will contact them after she arrives there.

## 2012-12-13 NOTE — ED Notes (Signed)
Spoke with Cecile Sheerer at [facility] about patient being transported In morning

## 2012-12-16 ENCOUNTER — Ambulatory Visit: Payer: Medicare Other

## 2012-12-19 ENCOUNTER — Ambulatory Visit (HOSPITAL_COMMUNITY): Payer: Self-pay | Admitting: Psychiatry

## 2013-01-27 DIAGNOSIS — F3160 Bipolar disorder, current episode mixed, unspecified: Secondary | ICD-10-CM | POA: Insufficient documentation

## 2013-01-27 DIAGNOSIS — E039 Hypothyroidism, unspecified: Secondary | ICD-10-CM | POA: Insufficient documentation

## 2013-01-27 DIAGNOSIS — E119 Type 2 diabetes mellitus without complications: Secondary | ICD-10-CM | POA: Insufficient documentation

## 2013-01-27 DIAGNOSIS — E1165 Type 2 diabetes mellitus with hyperglycemia: Secondary | ICD-10-CM | POA: Insufficient documentation

## 2013-04-07 ENCOUNTER — Other Ambulatory Visit (HOSPITAL_COMMUNITY): Payer: Self-pay | Admitting: Internal Medicine

## 2013-04-07 DIAGNOSIS — Z1231 Encounter for screening mammogram for malignant neoplasm of breast: Secondary | ICD-10-CM

## 2013-04-10 ENCOUNTER — Ambulatory Visit (HOSPITAL_COMMUNITY)
Admission: RE | Admit: 2013-04-10 | Discharge: 2013-04-10 | Disposition: A | Discharge status: Home / Self Care | Payer: Medicare Other | Source: Ambulatory Visit | Attending: Internal Medicine | Admitting: Internal Medicine

## 2013-04-10 DIAGNOSIS — Z1231 Encounter for screening mammogram for malignant neoplasm of breast: Secondary | ICD-10-CM | POA: Insufficient documentation

## 2013-05-07 ENCOUNTER — Institutional Professional Consult (permissible substitution): Payer: Self-pay | Admitting: Internal Medicine

## 2013-06-23 ENCOUNTER — Institutional Professional Consult (permissible substitution): Payer: Self-pay | Admitting: Internal Medicine

## 2013-06-24 ENCOUNTER — Encounter: Payer: Self-pay | Admitting: Internal Medicine

## 2013-08-26 ENCOUNTER — Other Ambulatory Visit: Payer: Self-pay | Admitting: Internal Medicine

## 2013-08-26 DIAGNOSIS — M79609 Pain in unspecified limb: Secondary | ICD-10-CM

## 2013-08-26 DIAGNOSIS — M2550 Pain in unspecified joint: Secondary | ICD-10-CM

## 2014-04-09 ENCOUNTER — Other Ambulatory Visit (HOSPITAL_COMMUNITY): Payer: Self-pay | Admitting: Internal Medicine

## 2014-04-09 DIAGNOSIS — Z1231 Encounter for screening mammogram for malignant neoplasm of breast: Secondary | ICD-10-CM

## 2014-04-14 IMAGING — CT CT ANGIO CHEST
1 of 2 series · 19 of 32 positions shown · IV contrast (OMNIPAQUE 300)
Comparison: 11/17/2011

CLINICAL DATA: Rule out pulmonary embolus.

CT ANGIOGRAPHY CHEST
TECHNIQUE: Multidetector CT imaging of the chest using the
standard protocol during bolus administration of intravenous
contrast. Multiplanar reconstructed images including MIPs were
obtained and reviewed to evaluate the vascular anatomy.
Contrast: 100mL OMNIPAQUE IOHEXOL 350 MG/ML SOLN

[Series 5: thins for pacs · axial · 0.59mm/px · z∈[-268,-58]mm · 19 of 230 slices shown]
[im 10/230  lung]
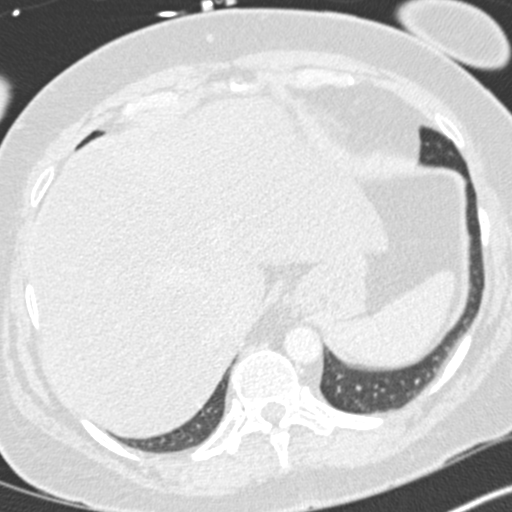
[im 20/230  soft-tissue]
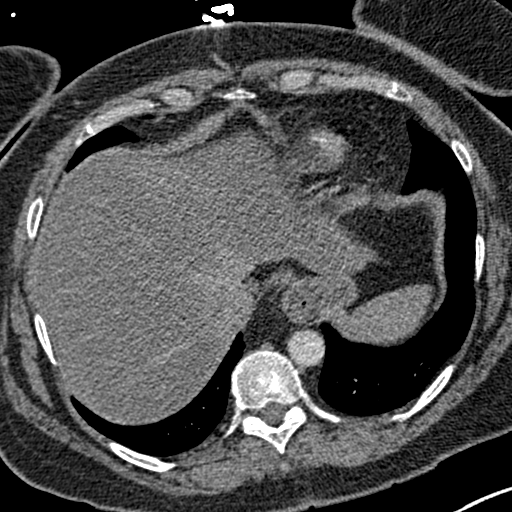
[im 30/230  lung]
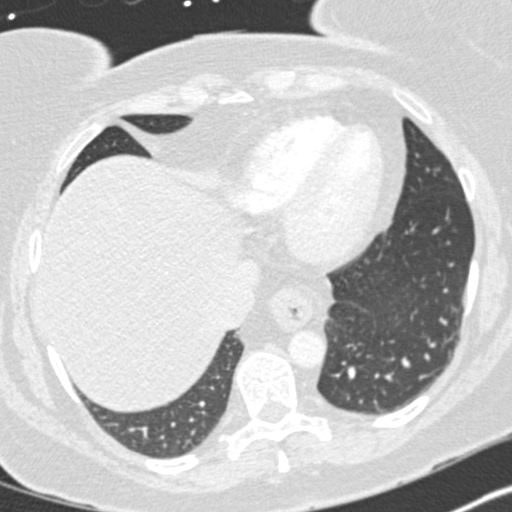
[im 50/230  soft-tissue]
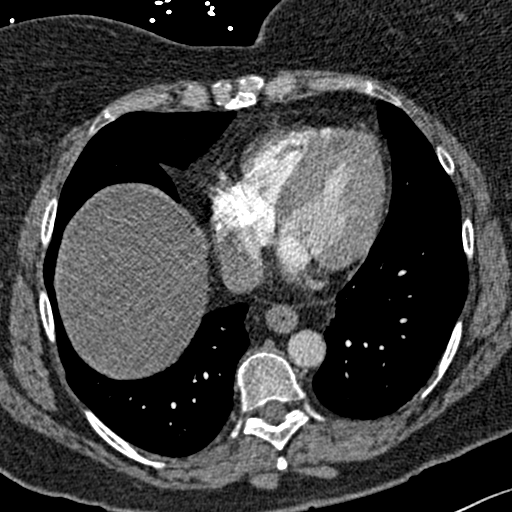
[im 60/230  lung]
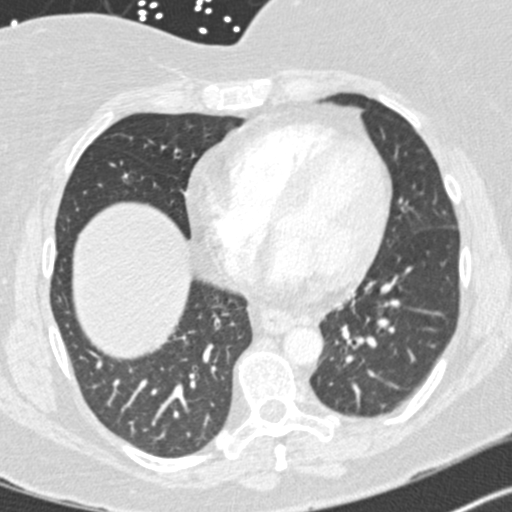
[im 70/230  soft-tissue]
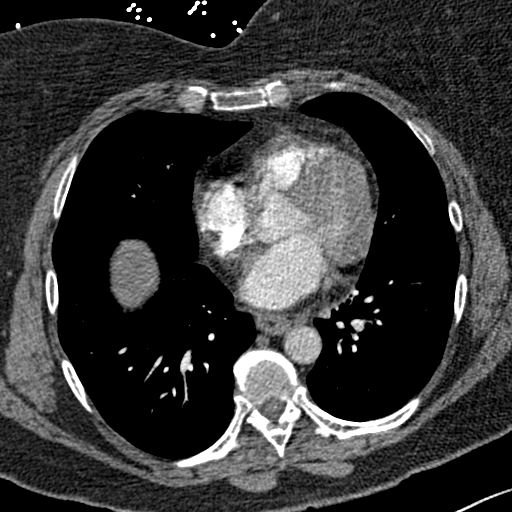
[im 80/230  lung]
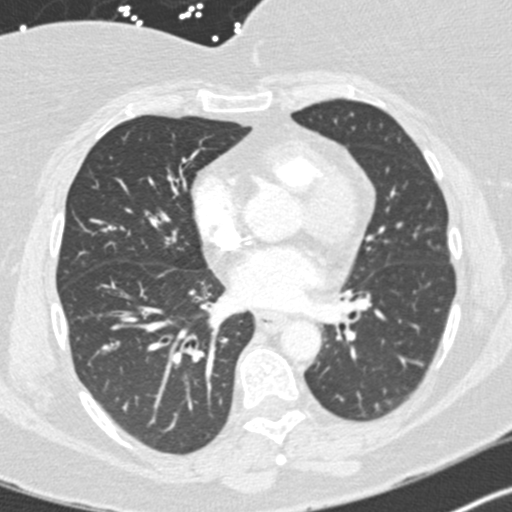
[im 90/230  soft-tissue]
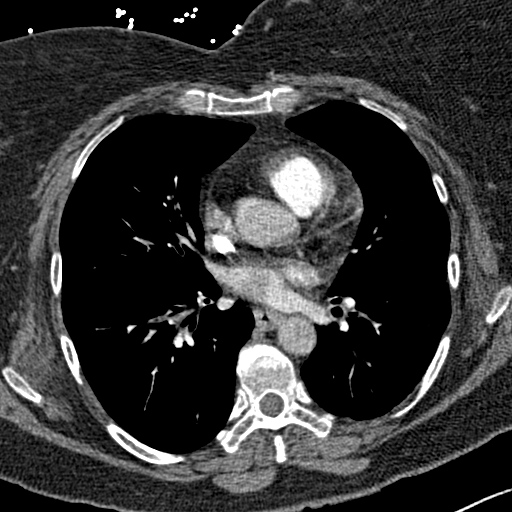
[im 100/230  lung]
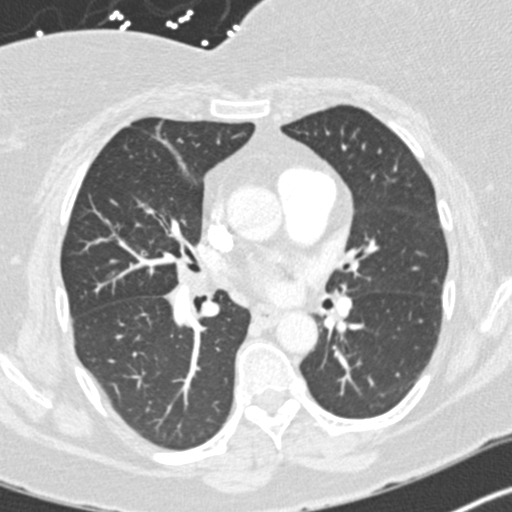
[im 120/230  soft-tissue]
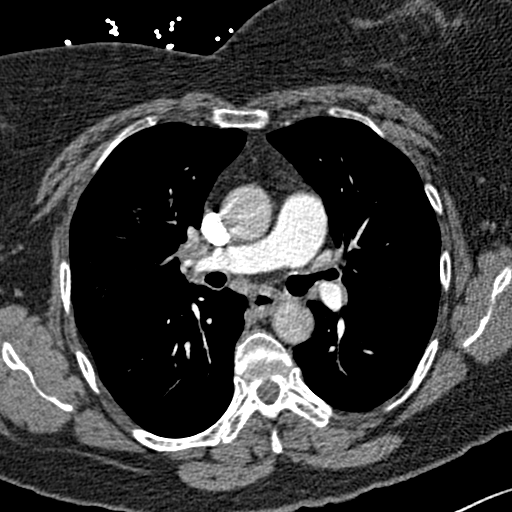
[im 130/230  lung]
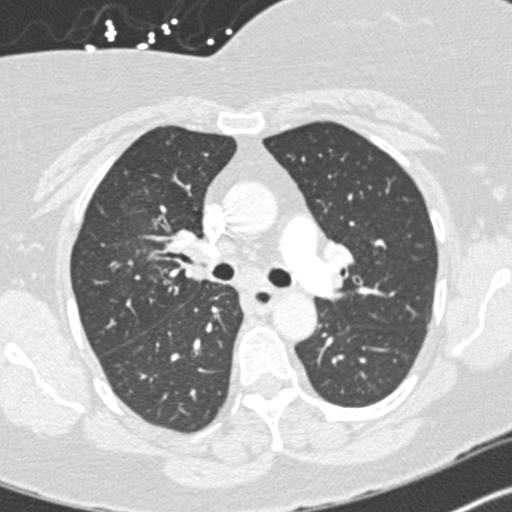
[im 140/230  soft-tissue]
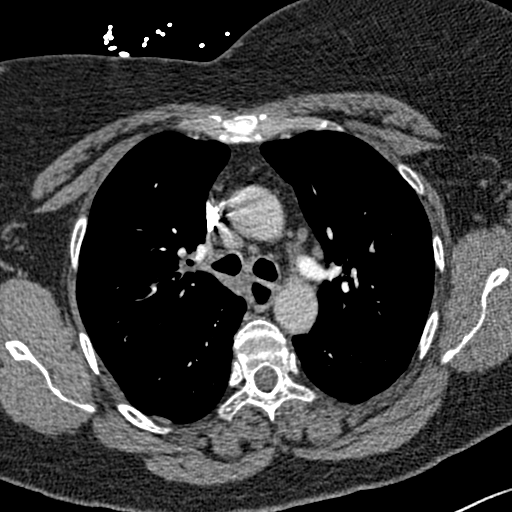
[im 150/230  lung]
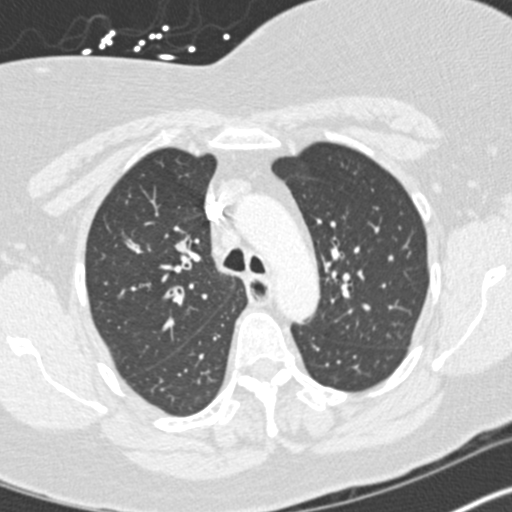
[im 160/230  soft-tissue]
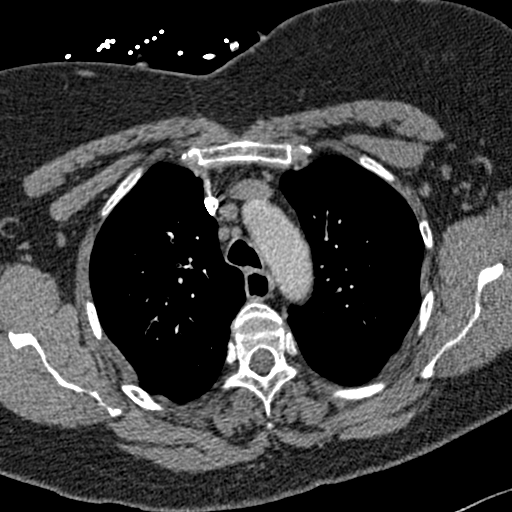
[im 170/230  lung]
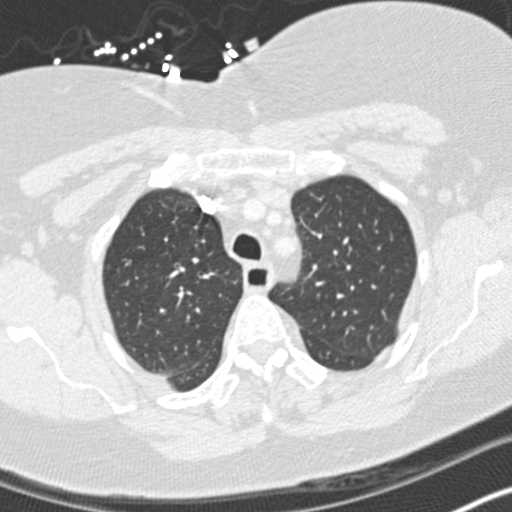
[im 180/230  soft-tissue]
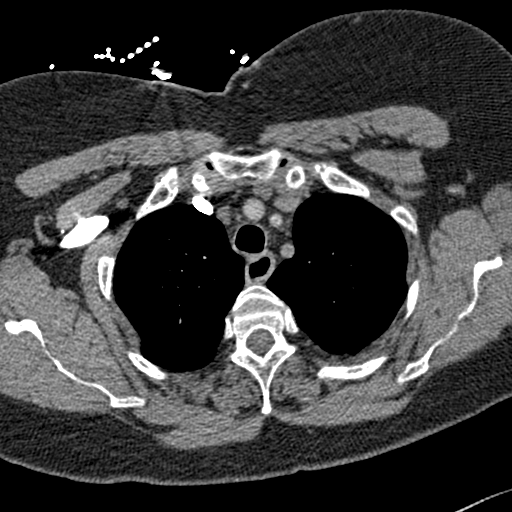
[im 200/230  lung]
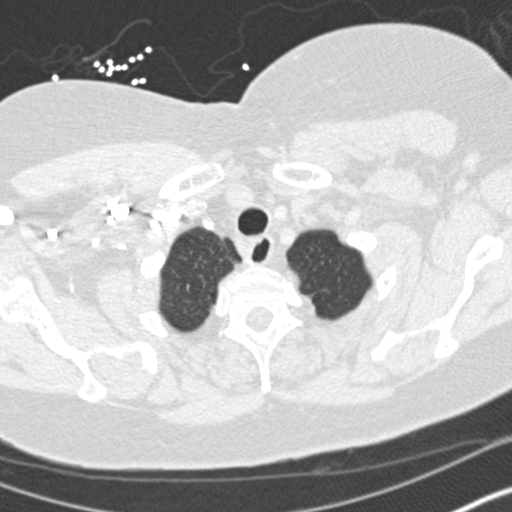
[im 210/230  soft-tissue]
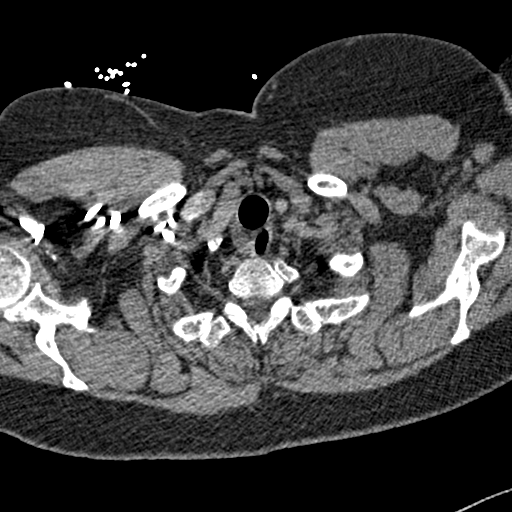
[im 220/230  lung]
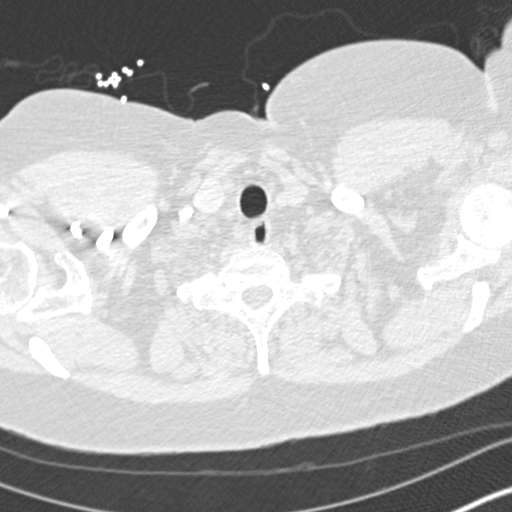

[19 of 32 positions shown; findings below may reference images not displayed]

FINDINGS: Lungs/pleura: No pleural effusion.  Nonspecific bronchial
wall thickening is identified.  Patchy sub solid density within the
peribronchovascular right upper lobe is identified, image 38.  This
measures 1.1 cm.

Heart/Mediastinum: The heart size appears normal.  No pericardial
effusion.  Prominent mediastinal lymph nodes are again identified.
Low right paratracheal lymph node measures 1.1 cm, image 30.  This
is compared with 1.3 cm previously.
Right hilar lymph node measures 1.3 cm, image 36.  This is compared
with 1.2 cm previously.  No abnormal filling defects within the
pulmonary arteries to suggest an acute pulmonary emboli.

Upper abdomen: Mild diffuse low attenuation within the liver
parenchyma.

Bones/Musculoskeletal:  No abnormalities identified.
IMPRESSION: 1.  No evidence for acute pulmonary embolus.
2.  Sub solid nodule in the right upper lobe is nonspecific and may
be the sequela of inflammation or infection.  Follow-up examination
in 3 months is recommended to assess for persistence. This
recommendation follows the consensus statement: Recommendations for
the Management of Subsolid Pulmonary Nodules Detected at CT:  A
[DATE]. Similar appearance of borderline enlarged mediastinal and hilar
lymph nodes.
4.  Hepatic steatosis.

## 2014-04-28 ENCOUNTER — Ambulatory Visit (HOSPITAL_COMMUNITY): Payer: Medicare Other | Attending: Internal Medicine

## 2014-05-20 ENCOUNTER — Ambulatory Visit (HOSPITAL_COMMUNITY): Payer: Medicare Other | Attending: Internal Medicine

## 2014-07-19 ENCOUNTER — Ambulatory Visit (HOSPITAL_COMMUNITY): Payer: Medicare Other | Attending: Internal Medicine

## 2014-08-09 DIAGNOSIS — E785 Hyperlipidemia, unspecified: Secondary | ICD-10-CM | POA: Insufficient documentation

## 2014-08-10 DIAGNOSIS — R161 Splenomegaly, not elsewhere classified: Secondary | ICD-10-CM | POA: Insufficient documentation

## 2014-08-10 DIAGNOSIS — K760 Fatty (change of) liver, not elsewhere classified: Secondary | ICD-10-CM | POA: Insufficient documentation

## 2014-08-10 DIAGNOSIS — R0602 Shortness of breath: Secondary | ICD-10-CM | POA: Insufficient documentation

## 2014-08-12 ENCOUNTER — Ambulatory Visit (HOSPITAL_COMMUNITY)
Admission: RE | Admit: 2014-08-12 | Discharge: 2014-08-12 | Disposition: A | Discharge status: Home / Self Care | Payer: Medicare Other | Source: Ambulatory Visit | Attending: Internal Medicine | Admitting: Internal Medicine

## 2014-08-12 DIAGNOSIS — Z1231 Encounter for screening mammogram for malignant neoplasm of breast: Secondary | ICD-10-CM

## 2015-04-14 ENCOUNTER — Encounter (HOSPITAL_COMMUNITY): Payer: Self-pay | Admitting: Medical

## 2015-04-14 ENCOUNTER — Ambulatory Visit (INDEPENDENT_AMBULATORY_CARE_PROVIDER_SITE_OTHER): Payer: Self-pay | Admitting: Medical

## 2015-04-14 VITALS — BP 126/78 | HR 94 | Ht 66.0 in | Wt 234.0 lb

## 2015-04-14 DIAGNOSIS — Z6838 Body mass index (BMI) 38.0-38.9, adult: Secondary | ICD-10-CM

## 2015-04-14 DIAGNOSIS — E039 Hypothyroidism, unspecified: Secondary | ICD-10-CM

## 2015-04-14 DIAGNOSIS — K760 Fatty (change of) liver, not elsewhere classified: Secondary | ICD-10-CM

## 2015-04-14 DIAGNOSIS — E1165 Type 2 diabetes mellitus with hyperglycemia: Secondary | ICD-10-CM

## 2015-04-14 DIAGNOSIS — F251 Schizoaffective disorder, depressive type: Secondary | ICD-10-CM

## 2015-04-14 DIAGNOSIS — F310 Bipolar disorder, current episode hypomanic: Secondary | ICD-10-CM

## 2015-04-14 DIAGNOSIS — G894 Chronic pain syndrome: Secondary | ICD-10-CM

## 2015-04-14 DIAGNOSIS — F419 Anxiety disorder, unspecified: Secondary | ICD-10-CM

## 2015-04-14 DIAGNOSIS — IMO0002 Reserved for concepts with insufficient information to code with codable children: Secondary | ICD-10-CM

## 2015-04-14 MED ORDER — CLONAZEPAM 0.5 MG PO TABS: ORAL_TABLET | ORAL | Status: DC

## 2015-04-14 MED ORDER — OLANZAPINE 20 MG PO TABS: 20.0000 mg | ORAL_TABLET | Freq: Every day | ORAL | Status: DC
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
## 2015-04-18 DIAGNOSIS — Z6838 Body mass index (BMI) 38.0-38.9, adult: Secondary | ICD-10-CM | POA: Insufficient documentation

## 2015-04-28 ENCOUNTER — Ambulatory Visit (INDEPENDENT_AMBULATORY_CARE_PROVIDER_SITE_OTHER): Payer: Medicare Other | Admitting: Licensed Clinical Social Worker

## 2015-04-28 ENCOUNTER — Encounter (HOSPITAL_COMMUNITY): Payer: Self-pay | Admitting: Medical

## 2015-04-28 ENCOUNTER — Ambulatory Visit (INDEPENDENT_AMBULATORY_CARE_PROVIDER_SITE_OTHER): Payer: Self-pay | Admitting: Medical

## 2015-04-28 DIAGNOSIS — F419 Anxiety disorder, unspecified: Secondary | ICD-10-CM

## 2015-04-28 DIAGNOSIS — F251 Schizoaffective disorder, depressive type: Secondary | ICD-10-CM | POA: Diagnosis not present

## 2015-04-28 DIAGNOSIS — E039 Hypothyroidism, unspecified: Secondary | ICD-10-CM

## 2015-04-28 DIAGNOSIS — F410 Panic disorder [episodic paroxysmal anxiety] without agoraphobia: Secondary | ICD-10-CM

## 2015-04-28 DIAGNOSIS — G894 Chronic pain syndrome: Secondary | ICD-10-CM

## 2015-04-28 DIAGNOSIS — K760 Fatty (change of) liver, not elsewhere classified: Secondary | ICD-10-CM

## 2015-04-28 DIAGNOSIS — F310 Bipolar disorder, current episode hypomanic: Secondary | ICD-10-CM

## 2015-04-28 DIAGNOSIS — E1165 Type 2 diabetes mellitus with hyperglycemia: Secondary | ICD-10-CM

## 2015-04-28 DIAGNOSIS — F4310 Post-traumatic stress disorder, unspecified: Secondary | ICD-10-CM | POA: Diagnosis not present

## 2015-04-28 DIAGNOSIS — IMO0002 Reserved for concepts with insufficient information to code with codable children: Secondary | ICD-10-CM | POA: Insufficient documentation

## 2015-04-28 MED ORDER — SERTRALINE HCL 100 MG PO TABS: 100.0000 mg | ORAL_TABLET | Freq: Two times a day (BID) | ORAL | Status: DC

## 2015-04-28 MED ORDER — CLONAZEPAM 0.5 MG PO TABS
ORAL_TABLET | ORAL | Status: DC
Start: 2015-04-28 — End: 2015-06-16
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
## 2015-04-29 ENCOUNTER — Encounter (HOSPITAL_COMMUNITY): Payer: Self-pay | Admitting: Licensed Clinical Social Worker

## 2015-04-29 DIAGNOSIS — F410 Panic disorder [episodic paroxysmal anxiety] without agoraphobia: Secondary | ICD-10-CM | POA: Insufficient documentation

## 2015-04-29 DIAGNOSIS — F4310 Post-traumatic stress disorder, unspecified: Secondary | ICD-10-CM | POA: Insufficient documentation

## 2015-04-29 DIAGNOSIS — F251 Schizoaffective disorder, depressive type: Secondary | ICD-10-CM | POA: Insufficient documentation
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
## 2015-06-16 ENCOUNTER — Ambulatory Visit (INDEPENDENT_AMBULATORY_CARE_PROVIDER_SITE_OTHER): Payer: Self-pay | Admitting: Medical

## 2015-06-16 ENCOUNTER — Encounter (HOSPITAL_COMMUNITY): Payer: Self-pay | Admitting: Medical

## 2015-06-16 VITALS — BP 138/78 | HR 95 | Ht 66.0 in | Wt 240.0 lb

## 2015-06-16 DIAGNOSIS — F410 Panic disorder [episodic paroxysmal anxiety] without agoraphobia: Secondary | ICD-10-CM

## 2015-06-16 DIAGNOSIS — F310 Bipolar disorder, current episode hypomanic: Secondary | ICD-10-CM

## 2015-06-16 DIAGNOSIS — F251 Schizoaffective disorder, depressive type: Secondary | ICD-10-CM

## 2015-06-16 DIAGNOSIS — Z6838 Body mass index (BMI) 38.0-38.9, adult: Secondary | ICD-10-CM

## 2015-06-16 DIAGNOSIS — F4310 Post-traumatic stress disorder, unspecified: Secondary | ICD-10-CM

## 2015-06-16 DIAGNOSIS — G894 Chronic pain syndrome: Secondary | ICD-10-CM

## 2015-06-16 DIAGNOSIS — Z9149 Other personal history of psychological trauma, not elsewhere classified: Secondary | ICD-10-CM

## 2015-06-16 DIAGNOSIS — IMO0002 Reserved for concepts with insufficient information to code with codable children: Secondary | ICD-10-CM

## 2015-06-16 DIAGNOSIS — R0602 Shortness of breath: Secondary | ICD-10-CM

## 2015-06-16 MED ORDER — OLANZAPINE 20 MG PO TABS: 20.0000 mg | ORAL_TABLET | Freq: Every day | ORAL | Status: DC

## 2015-06-16 MED ORDER — CLONAZEPAM 0.5 MG PO TABS: ORAL_TABLET | ORAL | Status: DC
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
## 2015-06-28 ENCOUNTER — Ambulatory Visit (HOSPITAL_COMMUNITY): Payer: Self-pay | Admitting: Licensed Clinical Social Worker

## 2015-06-30 ENCOUNTER — Ambulatory Visit (INDEPENDENT_AMBULATORY_CARE_PROVIDER_SITE_OTHER): Payer: Medicare Other | Admitting: Licensed Clinical Social Worker

## 2015-06-30 DIAGNOSIS — F410 Panic disorder [episodic paroxysmal anxiety] without agoraphobia: Secondary | ICD-10-CM

## 2015-06-30 DIAGNOSIS — F251 Schizoaffective disorder, depressive type: Secondary | ICD-10-CM | POA: Diagnosis not present
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
## 2015-07-21 ENCOUNTER — Ambulatory Visit (INDEPENDENT_AMBULATORY_CARE_PROVIDER_SITE_OTHER): Payer: Self-pay | Admitting: Medical

## 2015-07-21 ENCOUNTER — Encounter (HOSPITAL_COMMUNITY): Payer: Self-pay | Admitting: Medical

## 2015-07-21 VITALS — BP 124/68 | HR 99 | Ht 66.0 in | Wt 235.0 lb

## 2015-07-21 DIAGNOSIS — F410 Panic disorder [episodic paroxysmal anxiety] without agoraphobia: Secondary | ICD-10-CM

## 2015-07-21 DIAGNOSIS — G894 Chronic pain syndrome: Secondary | ICD-10-CM

## 2015-07-21 DIAGNOSIS — F4310 Post-traumatic stress disorder, unspecified: Secondary | ICD-10-CM

## 2015-07-21 DIAGNOSIS — E1165 Type 2 diabetes mellitus with hyperglycemia: Secondary | ICD-10-CM

## 2015-07-21 DIAGNOSIS — F3160 Bipolar disorder, current episode mixed, unspecified: Secondary | ICD-10-CM

## 2015-07-21 DIAGNOSIS — E039 Hypothyroidism, unspecified: Secondary | ICD-10-CM

## 2015-07-21 MED ORDER — CLONAZEPAM 0.5 MG PO TABS: ORAL_TABLET | ORAL | Status: DC

## 2015-07-21 MED ORDER — OLANZAPINE 20 MG PO TABS: 20.0000 mg | ORAL_TABLET | Freq: Every day | ORAL | Status: DC

## 2015-07-21 MED ORDER — SERTRALINE HCL 100 MG PO TABS: 100.0000 mg | ORAL_TABLET | Freq: Two times a day (BID) | ORAL | Status: DC
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
## 2015-08-01 ENCOUNTER — Ambulatory Visit (HOSPITAL_COMMUNITY): Payer: Medicare Other | Admitting: Licensed Clinical Social Worker

## 2015-08-16 ENCOUNTER — Ambulatory Visit (HOSPITAL_COMMUNITY): Payer: Medicare Other | Admitting: Licensed Clinical Social Worker

## 2015-08-19 ENCOUNTER — Ambulatory Visit (INDEPENDENT_AMBULATORY_CARE_PROVIDER_SITE_OTHER): Payer: Medicare Other | Admitting: Licensed Clinical Social Worker

## 2015-08-19 DIAGNOSIS — F410 Panic disorder [episodic paroxysmal anxiety] without agoraphobia: Secondary | ICD-10-CM

## 2015-08-19 DIAGNOSIS — F251 Schizoaffective disorder, depressive type: Secondary | ICD-10-CM

## 2015-08-19 NOTE — Progress Notes (Signed)
   THERAPIST PROGRESS NOTE  Session Time: [id number]   Participation Level: Active  Behavioral Response: Well GroomedAlert Anxious  Type of Therapy: Individual Therapy  Treatment Goals addressed: Coping  Interventions: Assertive communication   Suicidal/Homicidal: Denied both  Therapist Interventions: Gathered information about changes in mood and functioning.  Discussed her attempts to develop plans with her children for Christmas.  Recommended communicating to them just how much it means to her to have them spend time with her especially during the holidays.    Summary: Reported being concerned about her sister who lives in [place] and has leukemia.  Considering moving up there to be closer to family.  Reached out to her daughters by phone and text to make plans for Christmas but did not get any kind of response.  Wonders if she did anything to upset them.  Said she would consider expressing her feelings to them. Indicated she would like to cut the session short when she said, "I'm about all talked out."  Plan: Agreeable to the therapist's suggestion to schedule therapy appointments as needed.  Diagnosis: Schizoaffective Disorder                         Panic Disorder    Armandina Stammer 08/19/2015

## 2015-08-25 ENCOUNTER — Encounter (HOSPITAL_COMMUNITY): Payer: Self-pay | Admitting: Medical

## 2015-08-25 ENCOUNTER — Ambulatory Visit (INDEPENDENT_AMBULATORY_CARE_PROVIDER_SITE_OTHER): Payer: Self-pay | Admitting: Medical

## 2015-08-25 DIAGNOSIS — Z794 Long term (current) use of insulin: Secondary | ICD-10-CM

## 2015-08-25 DIAGNOSIS — F4310 Post-traumatic stress disorder, unspecified: Secondary | ICD-10-CM

## 2015-08-25 DIAGNOSIS — E1165 Type 2 diabetes mellitus with hyperglycemia: Secondary | ICD-10-CM

## 2015-08-25 DIAGNOSIS — F319 Bipolar disorder, unspecified: Secondary | ICD-10-CM

## 2015-08-25 DIAGNOSIS — G894 Chronic pain syndrome: Secondary | ICD-10-CM

## 2015-08-25 DIAGNOSIS — F410 Panic disorder [episodic paroxysmal anxiety] without agoraphobia: Secondary | ICD-10-CM

## 2015-08-25 MED ORDER — CLONAZEPAM 0.5 MG PO TABS: ORAL_TABLET | ORAL | Status: DC
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
## 2015-10-27 ENCOUNTER — Encounter (HOSPITAL_COMMUNITY): Payer: Self-pay | Admitting: Medical

## 2015-10-27 ENCOUNTER — Ambulatory Visit (INDEPENDENT_AMBULATORY_CARE_PROVIDER_SITE_OTHER): Payer: Medicare Other | Admitting: Medical

## 2015-10-27 VITALS — BP 118/60 | HR 100 | Ht 66.0 in | Wt 240.0 lb

## 2015-10-27 DIAGNOSIS — F4310 Post-traumatic stress disorder, unspecified: Secondary | ICD-10-CM | POA: Diagnosis not present

## 2015-10-27 DIAGNOSIS — F251 Schizoaffective disorder, depressive type: Secondary | ICD-10-CM | POA: Diagnosis not present

## 2015-10-27 DIAGNOSIS — G894 Chronic pain syndrome: Secondary | ICD-10-CM | POA: Diagnosis not present

## 2015-10-27 MED ORDER — CLONAZEPAM 0.5 MG PO TABS: ORAL_TABLET | ORAL | Status: DC

## 2015-10-27 MED ORDER — SERTRALINE HCL 100 MG PO TABS: 100.0000 mg | ORAL_TABLET | Freq: Two times a day (BID) | ORAL | Status: DC

## 2015-10-27 MED ORDER — OLANZAPINE 20 MG PO TABS: 20.0000 mg | ORAL_TABLET | Freq: Every day | ORAL | Status: DC
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
## 2015-11-18 ENCOUNTER — Other Ambulatory Visit: Payer: Self-pay | Admitting: Internal Medicine

## 2015-11-18 DIAGNOSIS — Z1231 Encounter for screening mammogram for malignant neoplasm of breast: Secondary | ICD-10-CM

## 2015-11-24 DIAGNOSIS — J441 Chronic obstructive pulmonary disease with (acute) exacerbation: Secondary | ICD-10-CM | POA: Insufficient documentation

## 2016-01-23 ENCOUNTER — Ambulatory Visit
Admission: RE | Admit: 2016-01-23 | Discharge: 2016-01-23 | Disposition: A | Discharge status: Home / Self Care | Payer: Medicare Other | Source: Ambulatory Visit | Attending: Internal Medicine | Admitting: Internal Medicine

## 2016-01-23 DIAGNOSIS — Z1231 Encounter for screening mammogram for malignant neoplasm of breast: Secondary | ICD-10-CM

## 2016-01-26 ENCOUNTER — Ambulatory Visit (INDEPENDENT_AMBULATORY_CARE_PROVIDER_SITE_OTHER): Payer: Medicare Other | Admitting: Medical

## 2016-01-26 ENCOUNTER — Encounter (HOSPITAL_COMMUNITY): Payer: Self-pay | Admitting: Medical

## 2016-01-26 DIAGNOSIS — F410 Panic disorder [episodic paroxysmal anxiety] without agoraphobia: Secondary | ICD-10-CM | POA: Diagnosis not present

## 2016-01-26 DIAGNOSIS — G894 Chronic pain syndrome: Secondary | ICD-10-CM

## 2016-01-26 DIAGNOSIS — F251 Schizoaffective disorder, depressive type: Secondary | ICD-10-CM

## 2016-01-26 DIAGNOSIS — F319 Bipolar disorder, unspecified: Secondary | ICD-10-CM | POA: Diagnosis not present

## 2016-01-26 DIAGNOSIS — F4321 Adjustment disorder with depressed mood: Secondary | ICD-10-CM

## 2016-01-26 DIAGNOSIS — Z794 Long term (current) use of insulin: Secondary | ICD-10-CM

## 2016-01-26 DIAGNOSIS — F4310 Post-traumatic stress disorder, unspecified: Secondary | ICD-10-CM | POA: Diagnosis not present

## 2016-01-26 DIAGNOSIS — E1165 Type 2 diabetes mellitus with hyperglycemia: Secondary | ICD-10-CM

## 2016-01-26 MED ORDER — BUPROPION HCL ER (SR) 100 MG PO TB12: 100.0000 mg | ORAL_TABLET | Freq: Two times a day (BID) | ORAL | Status: DC

## 2016-01-26 MED ORDER — OLANZAPINE 20 MG PO TABS: 20.0000 mg | ORAL_TABLET | Freq: Every day | ORAL | Status: DC

## 2016-01-26 MED ORDER — CLONAZEPAM 0.5 MG PO TABS: ORAL_TABLET | ORAL | Status: DC

## 2016-01-26 NOTE — Progress Notes (Addendum)
BH MD/PA/NP OP Progress Note  01/26/2016 11:05 AM Cheryl [name]  MRN:  [id number]  Subjective:  " I'm doing better" HPI: Pt returns for FU for her Bipolar I ;PTSD and GAD. She reports continued improvement.with medications .   Chief Complaint    Follow-up; Stress; Trauma; Bipolar 1; Agitation; Grief     Visit Diagnosis:    1.   PTSD (post-traumatic stress disorder)    309.81 F43.10    2.   Schizoaffective disorder, depressive type ([place])    295.70 F25.1     3.   Chronic pain syndrome    338.4 G89.4     4.   Bipolar 1 disorder (HCC)    296.7 F31.9   5.   Panic disorder    300.01 F41.0   6.   Grief reaction    309.0 F43.21     7.   Type 2 diabetes mellitus with hyperglycemia, with long-term current use of insulin (HCC)    250.00 ... E11.65 ...           Past Medical History:  Past Medical History  Diagnosis Date  . Thyroid disease   . Diabetes mellitus   . Depression   . Schizophrenia ([place])   . Bipolar affective ([place])   . Suicidal ideation   . Anxiety   . Hypothyroidism   . Recurrent upper respiratory infection (URI)   . Pneumonia     11/2011  . Arthritis     back, hands  . Shortness of breath     with exertion  . Headache(784.0)   . Blood transfusion   . History of DVT of lower extremity     left, after pregnancy  . High cholesterol   . High blood pressure     Past Surgical History  Procedure Laterality Date  . Appendectomy    . Tubal ligation    . Esophagogastroduodenoscopy  11/23/2011    Procedure: ESOPHAGOGASTRODUODENOSCOPY (EGD);  Surgeon: Landry Dyke, MD;  Location: [facility];  Service: Endoscopy;  Laterality: N/A;   Family History:  Family History  Problem Relation Age of Onset  . Diabetes Mother   . Lung cancer Father   . Heart failure Father    Social History:  Social History   Social History  . Marital Status: Divorced    Spouse Name: N/A  . Number of Children: N/A  . Years of Education: N/A   Social History Main Topics  . Smoking  status: Never Smoker   . Smokeless tobacco: Never Used  . Alcohol Use: No  . Drug Use: No  . Sexual Activity: Yes   Other Topics Concern  . None   Social History Narrative   Additional History: NCCSRS negative  Assessment:  1 Musculoskeletal: Strength & Muscle Tone: within normal limits Gait & Station: normal Patient leans: N/A  Psychiatric Specialty Exam: Anxiety Symptoms-improved nervous/anxious behavior. Patient reports no chest pain, dizziness, insomnia, palpitations, shortness of breath or suicidal ideas.   Trauma Has resolved the issues of her past with the help of understanding and forgiveness Pertinent negatives include no chest pain, coughing, loss of consciousness, memory loss, seizures, tingling or weakness.   See above  Review of Systems No change Constitutional: Negative.  Negative for fever, chills, weight loss, malaise/fatigue and diaphoresis.       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   Eyes: Negative.  Negative for blurred vision, double vision, photophobia, pain, discharge and redness.  Respiratory: Negative.  Negative for cough, shortness of breath and wheezing.   Cardiovascular: Negative.  Negative for chest pain, palpitations, orthopnea, claudication and leg swelling.  Gastrointestinal:       GERD hx  Musculoskeletal: Positive for back pain (Chronic pain Rx opiates). Negative for falls.  Neurological: Positive for tremors. Negative for dizziness, tingling, sensory change, speech change, focal weakness, seizures, loss of consciousness and weakness.  Endo/Heme/Allergies: Polydipsia: On Insulin and metformin for Diabetes. A1C at goal by hx Psychiatric/Behavioral: Negative for depression, suicidal ideas, hallucinations, memory loss and substance abuse. The patient is nervous/anxious. The patient does not have insomnia.     There were no vitals taken for this visit.There is no weight on file to calculate BMI.  General Appearance:  Neat;smiling  Eye Contact:  Good  Speech:  Clear and Coherent  Volume:  Normal  Mood:  Anxious  Affect:  Congruent  Thought Process:  Circumstantial, Coherent and emotive but more cognitive at this visit  Orientation:  Full (Time, Place, and Person)  Thought Content:  WDL and Anxious obsessions  Suicidal Thoughts:  No  Homicidal Thoughts:  No  Memory:  Negative  Judgement:  Fair  Insight:  Lacking  Psychomotor Activity:  Increased  Concentration:  Fair  Recall:  Good  Fund of Knowledge: Fair  Language: Fair  Akathisia:  NA  Handed:  Right  AIMS (if indicated):  NA  Assets:  Desire for Improvement Financial Resources/Insurance Housing Transportation  ADL's:  Intact  Cognition: WNL  Sleep:  Normal   Is the patient at risk to self?  No. Has the patient been a risk to self in the past 6 months?  No. Has the patient been a risk to self within the distant past?  No. Is the patient a risk to others?  No. Has the patient been a risk to others in the past 6 months?  No. Has the patient been a risk to others within the distant past?  No.  Current Medications: Current Outpatient Prescriptions  Medication Sig Dispense Refill  . albuterol (PROVENTIL HFA;VENTOLIN HFA) 108 (90 BASE) MCG/ACT inhaler Inhale 2 puffs into the lungs every 6 (six) hours as needed for wheezing.    Marland Kitchen amitriptyline (ELAVIL) 10 MG tablet     . buPROPion (WELLBUTRIN SR) 100 MG 12 hr tablet Take 1 tablet (100 mg total) by mouth 2 (two) times daily. 60 tablet 2  . clonazePAM (KLONOPIN) 0.5 MG tablet TID and HS 120 tablet 0  . dicyclomine (BENTYL) 20 MG tablet Take 20 mg by mouth every morning.    . docusate sodium (COLACE) 100 MG capsule Take 100 mg by mouth 2 (two) times daily.     . fluticasone (FLONASE) 50 MCG/ACT nasal spray Place 2 sprays into the nose daily as needed for allergies.    Marland Kitchen gabapentin (NEURONTIN) 600 MG tablet Take 600 mg by mouth 4 (four) times daily.     Marland Kitchen HYDROcodone-acetaminophen (NORCO)  10-325 MG tablet     . insulin NPH-insulin regular (NOVOLIN 70/30) (70-30) 100 UNIT/ML injection Inject 20-26 Units into the skin 2 (two) times daily. He takes 26 units at 7am and 20 units at 7pm.    . levothyroxine (SYNTHROID, LEVOTHROID) 150 MCG tablet Take 150 mcg by mouth daily before breakfast.     . lidocaine (LIDODERM) 5 % Place 1 patch onto the skin daily as needed (For pain.). Remove & Discard patch within 12 hours or as directed by MD    . lovastatin (MEVACOR) 20 MG  tablet Take 20 mg by mouth every morning.    . meloxicam (MOBIC) 7.5 MG tablet     . metFORMIN (GLUCOPHAGE) 1000 MG tablet     . Multiple Vitamin (MULITIVITAMIN WITH MINERALS) TABS Take 1 tablet by mouth daily with lunch.     . OLANZapine (ZYPREXA) 20 MG tablet Take 1 tablet (20 mg total) by mouth at bedtime. 30 tablet 2  . polyethylene glycol (MIRALAX / GLYCOLAX) packet Take 17-34 g by mouth every morning.     . sertraline (ZOLOFT) 100 MG tablet Take 1 tablet (100 mg total) by mouth 2 (two) times daily. 60 tablet 2   No current facility-administered medications for this visit.    Medical Decision Making:  Established Problem, Stable/Improving (1), Review of Last Therapy Session (1) and Review of Medication Regimen & Side Effects (2)  Treatment Plan Summary: Continue Zyprexa and Klonopin for Bipolar/PTSD/Panic FU 2 months  [name] PA 01/26/2016, 11:05 AM

## 2016-03-01 ENCOUNTER — Encounter (HOSPITAL_COMMUNITY): Payer: Self-pay | Admitting: Medical

## 2016-03-01 ENCOUNTER — Ambulatory Visit (INDEPENDENT_AMBULATORY_CARE_PROVIDER_SITE_OTHER): Payer: Medicare Other | Admitting: Medical

## 2016-03-01 VITALS — BP 124/78 | HR 91 | Ht 66.0 in | Wt 223.0 lb

## 2016-03-01 DIAGNOSIS — IMO0002 Reserved for concepts with insufficient information to code with codable children: Secondary | ICD-10-CM

## 2016-03-01 DIAGNOSIS — F4321 Adjustment disorder with depressed mood: Secondary | ICD-10-CM

## 2016-03-01 DIAGNOSIS — F410 Panic disorder [episodic paroxysmal anxiety] without agoraphobia: Secondary | ICD-10-CM | POA: Diagnosis not present

## 2016-03-01 DIAGNOSIS — Z9149 Other personal history of psychological trauma, not elsewhere classified: Secondary | ICD-10-CM | POA: Diagnosis not present

## 2016-03-01 DIAGNOSIS — F319 Bipolar disorder, unspecified: Secondary | ICD-10-CM | POA: Diagnosis not present

## 2016-03-01 DIAGNOSIS — Z794 Long term (current) use of insulin: Secondary | ICD-10-CM | POA: Diagnosis not present

## 2016-03-01 DIAGNOSIS — E119 Type 2 diabetes mellitus without complications: Secondary | ICD-10-CM | POA: Diagnosis not present

## 2016-03-01 DIAGNOSIS — F419 Anxiety disorder, unspecified: Secondary | ICD-10-CM

## 2016-03-01 DIAGNOSIS — F4310 Post-traumatic stress disorder, unspecified: Secondary | ICD-10-CM | POA: Diagnosis not present

## 2016-03-01 MED ORDER — CLONAZEPAM 0.5 MG PO TABS: ORAL_TABLET | ORAL | Status: DC
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
## 2016-03-21 ENCOUNTER — Ambulatory Visit (HOSPITAL_COMMUNITY): Payer: Self-pay | Admitting: Licensed Clinical Social Worker

## 2016-03-22 ENCOUNTER — Encounter (HOSPITAL_COMMUNITY): Payer: Self-pay | Admitting: Medical

## 2016-03-22 ENCOUNTER — Ambulatory Visit (INDEPENDENT_AMBULATORY_CARE_PROVIDER_SITE_OTHER): Payer: Medicare Other | Admitting: Medical

## 2016-03-22 VITALS — BP 124/70 | HR 99 | Ht 66.0 in | Wt 215.0 lb

## 2016-03-22 DIAGNOSIS — Z6838 Body mass index (BMI) 38.0-38.9, adult: Secondary | ICD-10-CM | POA: Diagnosis not present

## 2016-03-22 DIAGNOSIS — F251 Schizoaffective disorder, depressive type: Secondary | ICD-10-CM

## 2016-03-22 DIAGNOSIS — F319 Bipolar disorder, unspecified: Secondary | ICD-10-CM | POA: Diagnosis not present

## 2016-03-22 DIAGNOSIS — M549 Dorsalgia, unspecified: Secondary | ICD-10-CM | POA: Diagnosis not present

## 2016-03-22 DIAGNOSIS — F4310 Post-traumatic stress disorder, unspecified: Secondary | ICD-10-CM

## 2016-03-22 DIAGNOSIS — G8929 Other chronic pain: Secondary | ICD-10-CM

## 2016-03-22 DIAGNOSIS — E1165 Type 2 diabetes mellitus with hyperglycemia: Secondary | ICD-10-CM | POA: Diagnosis not present

## 2016-03-22 MED ORDER — OLANZAPINE 20 MG PO TABS: 20.0000 mg | ORAL_TABLET | Freq: Every day | ORAL | Status: DC

## 2016-03-22 MED ORDER — BUPROPION HCL ER (SR) 100 MG PO TB12: 100.0000 mg | ORAL_TABLET | Freq: Two times a day (BID) | ORAL | Status: DC

## 2016-03-22 MED ORDER — PROPRANOLOL HCL 10 MG PO TABS: ORAL_TABLET | ORAL | Status: DC

## 2016-03-22 MED ORDER — CLONAZEPAM 0.5 MG PO TABS: ORAL_TABLET | ORAL | Status: DC
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
## 2016-03-26 ENCOUNTER — Ambulatory Visit (HOSPITAL_COMMUNITY): Payer: Self-pay | Admitting: Licensed Clinical Social Worker

## 2016-04-09 ENCOUNTER — Ambulatory Visit (INDEPENDENT_AMBULATORY_CARE_PROVIDER_SITE_OTHER): Payer: Medicare Other | Admitting: Licensed Clinical Social Worker

## 2016-04-09 ENCOUNTER — Encounter (HOSPITAL_COMMUNITY): Payer: Self-pay | Admitting: Licensed Clinical Social Worker

## 2016-04-09 DIAGNOSIS — F4310 Post-traumatic stress disorder, unspecified: Secondary | ICD-10-CM

## 2016-04-09 DIAGNOSIS — F410 Panic disorder [episodic paroxysmal anxiety] without agoraphobia: Secondary | ICD-10-CM | POA: Diagnosis not present

## 2016-04-09 DIAGNOSIS — F419 Anxiety disorder, unspecified: Secondary | ICD-10-CM
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
## 2016-05-01 ENCOUNTER — Ambulatory Visit (INDEPENDENT_AMBULATORY_CARE_PROVIDER_SITE_OTHER): Payer: Medicare Other | Admitting: Licensed Clinical Social Worker

## 2016-05-01 DIAGNOSIS — F419 Anxiety disorder, unspecified: Secondary | ICD-10-CM | POA: Diagnosis not present

## 2016-05-01 DIAGNOSIS — F410 Panic disorder [episodic paroxysmal anxiety] without agoraphobia: Secondary | ICD-10-CM

## 2016-05-01 NOTE — Progress Notes (Signed)
   THERAPIST PROGRESS NOTE  Session Time: 10 AM to 10:50 AM  Participation Level: Active  Behavioral Response: CasualAlertAnxious  Type of Therapy: Individual Therapy  Treatment Goals addressed:  learn stress management skills, decrease anxiety and panic  Interventions: Solution Focused, Strength-based, Anger Management Training and Other: Skills to manage anxiety and panic, anger management, education about drug and alcohol  Summary: Cheryl [name] is a 60 y.o. female who presents with problems with daughter. Her doctor has stopped prescribing her drugs and\s her daughter is blaming patient. This creates a very stressful environment for patient and daughter even becomes threatening. Patient does not think that daughter will harm her but does have a safety plan. She will go to the neighbors and she has no problem calling 911. Discussed being forced to stop using can have positive consequences and patient can take steps to manage stressors by educating daughter about destructive anger and using motivational strategies that will build motivation for her daughter to stay clean. Patient reports having panic daily and describes this as tightness of chest can't sit still, headaches and she lays down and tries to calm herself. She also steps away when conflict with her daughter so they both can calm down. Explains that she is been seeing her neighbor and seems to be going well but her daughter doesn't want her to see neighbor and wants her to herself.   Suicidal/Homicidal: No  Therapist Response: Educated patient on addiction related to her daughter and that her daughter is misplacing anger onto patient. Explained that anger is normal in early recovery and in this case particular because patient is being forced to stop using. Explains that this is sometimes what it takes to change people's lives. Discussed safety plan for patient with daughter if daughter's anger becomes aggressive and destructive. Educated  patient on anger management and encouraged her to help her daughter with this. Discussed positive strategies to deal with panic including distraction, relaxation, challenging catastrophize anything thinking and deep breathing. Completed treatment plan. Patient would like stressful environment at home to improve and overall decreased anxiety and panic attacks. Provided positive feedback for positive coping strategies that patient is implementing. Provided positive feedback related to patient being in relationship and encouraged her to recognize she has the right to have the relationship and set boundaries with her daughter/  Plan: Return again in 2 weeks.2 patient read handout on anger management and give to daughter, 3. Patient continue to learn and apply skills to handle anxiety and panic  Diagnosis: Axis I: Panic disorder, chronic anxiety, PTSD    Axis II: No diagnosis    Latonyia Lopata A, LCSW 05/01/2016

## 2016-05-15 ENCOUNTER — Ambulatory Visit (INDEPENDENT_AMBULATORY_CARE_PROVIDER_SITE_OTHER): Payer: Medicare Other | Admitting: Licensed Clinical Social Worker

## 2016-05-15 DIAGNOSIS — F410 Panic disorder [episodic paroxysmal anxiety] without agoraphobia: Secondary | ICD-10-CM | POA: Diagnosis not present

## 2016-05-15 DIAGNOSIS — F419 Anxiety disorder, unspecified: Secondary | ICD-10-CM | POA: Diagnosis not present

## 2016-05-15 NOTE — Progress Notes (Signed)
   THERAPIST PROGRESS NOTE  Session Time: 9 AM to 9:50 AM  Participation Level: Active  Behavioral Response: CasualAlertEuthymic  Type of Therapy: Individual Therapy  Treatment Goals addressed:   learn stress management skills, decrease anxiety and panic  Interventions: Solution Focused, Strength-based, Supportive, Family Systems and Other: Drug and alcohol counseling  Summary: Cheryl [name] is a 60 y.o. female who presents with continued stressor of her daughter who is one of the main causes of her mental health symptoms. She is hoping that daughter is motivated to get off drugs when she gets to the pain clinic but her behaviors do not indicate that she is ready to stop. Reviewed strategies that could help build daughter's motivation to stop using. Daughters pattern is to be normal during the week, on Friday and Saturday doing drugs and when she is coming off the drugs is when she can express her rage and behavior is unpredictable. This can precipitate patient's panic but also relates the unpredictability of her daughter increases her anxiety. Panic symptoms are often. She is stressed out every day worrying about her daughter. She can't sleep thinking about all this and she is groggy today because of taking a sleep medication last night. She hopes that she gets medications at the pain clinic that will calm her daughter so she will need to take drugs. Another option is that her daughter could stay with her boyfriend. Patient realizes that her daughter controls her life but also does not want to have her live on the streets and her daughter's income helps out with expenses in the house. Reports that relationship with her boyfriend is good but daughter is trying to sabotage that. Reviewed various coping strategies patient is able to use for panic but difficult given the unpredictability and potentially volatile situation with daughter. She does have a safety plan in place in that she can go to boyfriend's  next door and will call police if necessary.     Suicidal/Homicidal: No  Therapist Response: Reviewed main trigger for patient's anxiety is her daughter's continued drug use. Reviewed pros and cons of different options for patient to manage problems and stressors from daughter. Provided education about addiction and the need to set boundaries in order not to enable and order to be better shielded from negative consequences from addicted persons behaviors. Identified the negative impact her daughter has on her life including on her relationship and controlling her to build motivation for patient to make positive changes. Worked on building insight of the need for her daughter as an adult to be responsible for her own life. Reviewed coping for panic and gave patient positive feedback of removing herself from the situation as a positive strategy. Reviewed ways that patient can keep herself safe if her daughter daughter's behavior escalates and becomes threatening. Provided supportive interventions and help patient process emotions in session    Plan: Return again in 2 weeks.2. Therapist continued to build insight with patient on healthy ways to manage triggers.3. Patient continue to learn and apply effective strategies to manage panic and anxiety.  Diagnosis: Axis I:  Panic disorder, chronic anxiety, PTSD    Axis II: No diagnosis    Jermane Brayboy A, LCSW 05/15/2016

## 2016-05-17 ENCOUNTER — Ambulatory Visit (INDEPENDENT_AMBULATORY_CARE_PROVIDER_SITE_OTHER): Payer: Medicare Other | Admitting: Medical

## 2016-05-17 ENCOUNTER — Encounter (HOSPITAL_COMMUNITY): Payer: Self-pay | Admitting: Medical

## 2016-05-17 VITALS — BP 128/82 | HR 100 | Ht 66.0 in | Wt 212.2 lb

## 2016-05-17 DIAGNOSIS — F05 Delirium due to known physiological condition: Secondary | ICD-10-CM

## 2016-05-17 DIAGNOSIS — R410 Disorientation, unspecified: Secondary | ICD-10-CM | POA: Insufficient documentation

## 2016-05-17 DIAGNOSIS — F4310 Post-traumatic stress disorder, unspecified: Secondary | ICD-10-CM | POA: Diagnosis not present

## 2016-05-17 DIAGNOSIS — F319 Bipolar disorder, unspecified: Secondary | ICD-10-CM

## 2016-05-17 DIAGNOSIS — E1165 Type 2 diabetes mellitus with hyperglycemia: Secondary | ICD-10-CM

## 2016-05-17 DIAGNOSIS — Z794 Long term (current) use of insulin: Secondary | ICD-10-CM

## 2016-05-17 DIAGNOSIS — G8929 Other chronic pain: Secondary | ICD-10-CM | POA: Diagnosis not present

## 2016-05-17 DIAGNOSIS — M549 Dorsalgia, unspecified: Secondary | ICD-10-CM

## 2016-05-17 DIAGNOSIS — F419 Anxiety disorder, unspecified: Secondary | ICD-10-CM | POA: Diagnosis not present

## 2016-05-17 DIAGNOSIS — Z9149 Other personal history of psychological trauma, not elsewhere classified: Secondary | ICD-10-CM

## 2016-05-17 DIAGNOSIS — IMO0002 Reserved for concepts with insufficient information to code with codable children: Secondary | ICD-10-CM

## 2016-05-17 DIAGNOSIS — F410 Panic disorder [episodic paroxysmal anxiety] without agoraphobia: Secondary | ICD-10-CM

## 2016-05-17 MED ORDER — BUPROPION HCL ER (SR) 100 MG PO TB12: 100.0000 mg | ORAL_TABLET | Freq: Two times a day (BID) | ORAL | 2 refills | Status: DC

## 2016-05-17 MED ORDER — PROPRANOLOL HCL 10 MG PO TABS: ORAL_TABLET | ORAL | 2 refills | Status: DC

## 2016-05-17 MED ORDER — OLANZAPINE 20 MG PO TABS: 20.0000 mg | ORAL_TABLET | Freq: Every day | ORAL | 2 refills | Status: DC

## 2016-05-17 MED ORDER — CLONAZEPAM 0.5 MG PO TABS: ORAL_TABLET | ORAL | 1 refills | Status: DC
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
## 2016-05-28 ENCOUNTER — Ambulatory Visit (INDEPENDENT_AMBULATORY_CARE_PROVIDER_SITE_OTHER): Payer: Medicare Other | Admitting: Licensed Clinical Social Worker

## 2016-05-28 DIAGNOSIS — F419 Anxiety disorder, unspecified: Secondary | ICD-10-CM

## 2016-05-28 DIAGNOSIS — F410 Panic disorder [episodic paroxysmal anxiety] without agoraphobia: Secondary | ICD-10-CM

## 2016-05-28 DIAGNOSIS — F4310 Post-traumatic stress disorder, unspecified: Secondary | ICD-10-CM | POA: Diagnosis not present

## 2016-05-28 NOTE — Progress Notes (Signed)
   THERAPIST PROGRESS NOTE  Session Time: 8:05 AM to 8:55 AM  Participation Level: Active  Behavioral Response: CasualAlertAnxious  Type of Therapy: Individual Therapy  Treatment Goals addressed:  learn stress management skills, decrease anxiety and panic  Interventions: Solution Focused, Strength-based, Supportive and Other: Stress management strategies  Summary: Cheryl [name] is a 60 y.o. female who presents problems sleeping last night related to pain in legs. Shared that her daughter is still waiting to get meds for the pain center. She is worried about her taking too many. She feels that it is never ending and she doesn't know what to do. Related that she discussed with Juanda Crumble, nurse practitioner in our office, concerns about her voice being slurred, pain in chest, and off balance. They discussed the possibility of mini stroke and she is going to see her primary care doctor this week for referral to be tested. Relates that it feels like she goes from addressing one issue to another and he comes overwhelming. She doesn't know what is going to be from time she gets up to time to goes to bed. Described long history of dealing with issues with her daughter and discussed with her daughter is with addressing her addiction issues. Relates that her daughter knows she has a drug problem but doesn't want to do anything about it. She wishes there is a small thing that she could do to help her. Described other health issues including  surgery on her eye last week and recovering from this.  Concerned about having to wear glasses and hard to manage everything. Shared that she gets support from boyfriend who just she just deal with one thing at a time and at some point she you will get a break that works in her favor. Patient interested in learning more about Nar-anon as she feels she is out of point where she may need it.   Suicidal/Homicidal: No  Therapist Response: Provided supportive interventions for  patient who maintains worried about her daughter and her addiction and main source of patient's anxiety and panic. Engaged in problem solving with patient to address stress and anxiety. Discussed patient being involved in session with daughter at pain clinic, expressing her concerns and providing suggestions of strategies to work on positive treatment goals to help daughter address addiction issues. Discussed patient getting involved with Nar-anon as a good support for patient. Discussed coping strategy of taking one thing at a time so as not to overwhelm herself, utilizing her support,and staying focused on the positive. Provided positive feedback about patient's steps to manage stressors including taking care of health issues even while stressed about her daughter. Provided strength based interventions.   Plan: Return again in 3 weeks.2. Patient continue to use effective coping including problem solving to manage stress and anxiety.3. Therapist continued to utilize supportive interventions  Diagnosis: Axis I:  Panic disorder, chronic anxiety, PTSD    Axis II: No diagnosis    Bowman,[name], LCSW 05/28/2016

## 2016-06-11 ENCOUNTER — Ambulatory Visit (INDEPENDENT_AMBULATORY_CARE_PROVIDER_SITE_OTHER): Payer: Medicare Other | Admitting: Licensed Clinical Social Worker

## 2016-06-11 DIAGNOSIS — F419 Anxiety disorder, unspecified: Secondary | ICD-10-CM

## 2016-06-11 DIAGNOSIS — F4310 Post-traumatic stress disorder, unspecified: Secondary | ICD-10-CM | POA: Diagnosis not present

## 2016-06-11 DIAGNOSIS — F410 Panic disorder [episodic paroxysmal anxiety] without agoraphobia: Secondary | ICD-10-CM

## 2016-06-11 NOTE — Progress Notes (Signed)
   THERAPIST PROGRESS NOTE  Session Time: 9 AM to 9:50 AM  Participation Level: Active  Behavioral Response: CasualAlertAnxious  Type of Therapy: Individual Therapy  Treatment Goals addressed:  earn stress management skills, decrease anxiety and panic  Interventions: Solution Focused, Supportive, Family Systems and Other: Education on drug and alcohol treatment  Summary: Cheryl [name] is a 60 y.o. female who presents with daughter who is still in the middle of dealing with drug and alcohol issues. Patient recognizes at some level that her daughter is an adult and has to take care of herself but unable to ask her to leave as she is afraid will happen to her. She hopes that she goes inpatient. Discussed options that would initiate the step of patient getting help and identified getting an appointment at [facility] where she can get help with her "nervies". Discussed how this could be a start for getting some help and dark to address drug and alcohol issues. Therapist encouraged patient to be involved in her treatment to give input to help her get help and discussed with patient strategies that will help her daughter look at the cost of her addiction. Her boyfriend, [name], has shared his own experience and said it can get worse before they get help and and it helped to not enable the person. Discussed with patient that Nar-Anon is a helpful resource and patient states she would be willing to go to an NA meeting with her daughter. Her younger daughter, [name], is moving into the house and said that this could be helpful for the situation but also could be problems if they fight.patient relates to thinking about her daughter 24 hours and causes a lot of worry.    Suicidal/Homicidal: No  Therapist Response: Utilized problem solving and supportive interventions related to main stressor of patient's daughter. Encouraged patient to be proactive by taking steps in facilitating contact with [facility] to initiate  treatment. Reviewed pros and cons of enabling daughter and discussed strategies patient could use to help her daughter in building motivation to address drug and alcohol problems. Encouraged patient to use supports. Help patient process feelings in session and provided strength-based interventions.  Plan: Return again in 2 weeks.2. Therapist continue to provide education and build insight about strategies to help her daughter with drug and alcohol issues. 3. Patient continued to learn and implement strategies to manage stress that will help with anxiety.  Diagnosis: Axis I:  Panic disorder, chronic anxiety, PTSD    Axis II: No diagnosis    Bowman,[name], LCSW 06/11/2016

## 2016-06-26 ENCOUNTER — Ambulatory Visit (INDEPENDENT_AMBULATORY_CARE_PROVIDER_SITE_OTHER): Payer: Medicare Other | Admitting: Licensed Clinical Social Worker

## 2016-06-26 DIAGNOSIS — F419 Anxiety disorder, unspecified: Secondary | ICD-10-CM

## 2016-06-26 DIAGNOSIS — F4310 Post-traumatic stress disorder, unspecified: Secondary | ICD-10-CM | POA: Diagnosis not present

## 2016-06-26 DIAGNOSIS — F410 Panic disorder [episodic paroxysmal anxiety] without agoraphobia: Secondary | ICD-10-CM

## 2016-06-26 NOTE — Progress Notes (Signed)
   THERAPIST PROGRESS NOTE  Session Time: 2 PM to 2:50 PM  Participation Level: Active  Behavioral Response: CasualAlertEuthymic  Type of Therapy: Individual Therapy  Treatment Goals addressed:  learn stress management skills, decrease anxiety and panic  Interventions: CBT, Motivational Interviewing, Solution Focused, Biofeedback, Supportive, Family Systems and Other: Education on drug and alcohol, skills for effective interpersonal relationships  Summary: Cheryl [name] is a 60 y.o. female who presents relating that symptoms are getting worse. Describes daughter, Cheryl [name]'s, outbursts as frequent and scary. She gets furious when she can't find her pills. It upsets her so bad that she takes Klonopin and a panic pill. Her still pain in her chest still hurts and she gets headaches. Her stress level is so high and it is all the time. She has moved in with her boyfriend, Cheryl [name], but he still lives next-door. He has thought of options and she thinks she will sit down with her daughter and ask what she wants to get from life in the road she is going down will cause her to die and will also affect her her and siblings. Patient has other stressors. Daughter, Cheryl [name], who came to live with them thought with Cheryl [name] and as a result was arrested and has charges against her. She had cataract surgery and now worried about her eye. Vision in left eye has not cleared and she is worried with will happen and she is also worried about financial issues if she has to have another surgery. She wonders if her blurred vision is related to all the stress she is experiencing.  Suicidal/Homicidal: No  Therapist Response: Reviewed reviewed current symptoms and continued to address coping strategies and problem solving related daughter's addiction as main trigger. Reviewed pros and cons of different approaches to to help patient with problem solving for patient to come to decision how she wants to address problem with her daughter.  Explained that as a drug and alcohol counselor, the recommendation is to not enable as this allows the drug addict to continue in their addictive behavior. Related though if she chooses to continue to support daughter that things get worse and worse in the disease of addiction and negative consequences may cause her to make changes. Provided positive feedback for patient removing herself from the situation by moving next door. Encouraged patient in setting even more boundaries and she has a right to expect people in her life to treat her decently. Help patient to process and work through emotions in session related to the multi-stressors.  Discussed motivational interviewing as an approach patient continues to help build internal motivation for her daughter to change. Explained that patient will not be able to protect her daughter from negative consequences of use. Continue to work with patient on CBT strategies to challenge negative thoughts and negative expectations in the future. Provided supportive and strength-based interventions. Encouraged patient to explore and connect with resources such as Nar-Anon  Plan: Return again in 2 weeks.2. Patient came inside implement strategies to help with stressors and anxiety.  Diagnosis: Axis I:  Panic disorder, chronic anxiety, PTSD    Axis II: No diagnosis    Vannie Hilgert A, LCSW 06/26/2016

## 2016-07-10 ENCOUNTER — Ambulatory Visit (HOSPITAL_COMMUNITY): Payer: Self-pay | Admitting: Licensed Clinical Social Worker

## 2016-07-19 ENCOUNTER — Ambulatory Visit (INDEPENDENT_AMBULATORY_CARE_PROVIDER_SITE_OTHER): Payer: Medicare Other | Admitting: Medical

## 2016-07-19 ENCOUNTER — Encounter (HOSPITAL_COMMUNITY): Payer: Self-pay | Admitting: Medical

## 2016-07-19 VITALS — BP 116/74 | HR 78 | Resp 16 | Ht 66.0 in | Wt 201.0 lb

## 2016-07-19 DIAGNOSIS — Z79899 Other long term (current) drug therapy: Secondary | ICD-10-CM

## 2016-07-19 DIAGNOSIS — F319 Bipolar disorder, unspecified: Secondary | ICD-10-CM

## 2016-07-19 DIAGNOSIS — Z794 Long term (current) use of insulin: Secondary | ICD-10-CM

## 2016-07-19 DIAGNOSIS — F419 Anxiety disorder, unspecified: Secondary | ICD-10-CM

## 2016-07-19 DIAGNOSIS — Z8249 Family history of ischemic heart disease and other diseases of the circulatory system: Secondary | ICD-10-CM | POA: Diagnosis not present

## 2016-07-19 DIAGNOSIS — Z801 Family history of malignant neoplasm of trachea, bronchus and lung: Secondary | ICD-10-CM

## 2016-07-19 DIAGNOSIS — Z833 Family history of diabetes mellitus: Secondary | ICD-10-CM | POA: Diagnosis not present

## 2016-07-19 DIAGNOSIS — F4310 Post-traumatic stress disorder, unspecified: Secondary | ICD-10-CM

## 2016-07-19 DIAGNOSIS — E119 Type 2 diabetes mellitus without complications: Secondary | ICD-10-CM | POA: Diagnosis not present

## 2016-07-19 DIAGNOSIS — F410 Panic disorder [episodic paroxysmal anxiety] without agoraphobia: Secondary | ICD-10-CM | POA: Diagnosis not present

## 2016-07-19 MED ORDER — PROPRANOLOL HCL 10 MG PO TABS: ORAL_TABLET | ORAL | 2 refills | Status: DC

## 2016-07-19 MED ORDER — BUPROPION HCL ER (SR) 100 MG PO TB12: 100.0000 mg | ORAL_TABLET | Freq: Two times a day (BID) | ORAL | 2 refills | Status: DC

## 2016-07-19 MED ORDER — OLANZAPINE 20 MG PO TABS: 20.0000 mg | ORAL_TABLET | Freq: Every day | ORAL | 2 refills | Status: DC

## 2016-07-19 MED ORDER — CLONAZEPAM 0.5 MG PO TABS: ORAL_TABLET | ORAL | 2 refills | Status: DC

## 2016-07-19 NOTE — Progress Notes (Signed)
BH MD/PA/NP OP Progress Note  07/19/2016 10:50 AM Cheryl [name]  MRN:  [id number]  Subjective: " I lost 9 lbs" HPI: Pt returns for FU for her Bipolar I ;PTSD and GAD. She reports continued improvement with medications especially the propranolol 10 mg for panic.Still finds daughter stressful but continues to be less so. Does report a Doctor visit phobia. She is in counseling and says Cheryl [name] "helps me a lot"  NO C/O POLYURIA;POLYPHAGIA;POLYDIPSIA   Chief Complaint    Follow-up; Bipolar 1; Trauma; Anxiety     Visit Diagnosis:           P   ICD-9-CM ICD-10-CM   PL                1.   Bipolar 1 disorder ([place]) 296.7 F31.9       2.   PTSD (post-traumatic stress disorder) 309.81 F43.10       3.   Chronic anxiety 300.00 F41.9       4.   Panic disorder 300.01 F41.0       5.   Insulin dependent type 2 diabetes mellitus, controlled ([place]) 250.00... E11.9.Marland KitchenMarland Kitchen                Past Medical History:  Past Medical History:  Diagnosis Date  . Anxiety   . Arthritis    back, hands  . Bipolar affective ([place])   . Blood transfusion   . Depression   . Diabetes mellitus   . Headache(784.0)   . High blood pressure   . High cholesterol   . History of DVT of lower extremity    left, after pregnancy  . Hypothyroidism   . Pneumonia    11/2011  . Recurrent upper respiratory infection (URI)   . Schizophrenia ([place])   . Shortness of breath    with exertion  . Suicidal ideation   . Thyroid disease     Past Surgical History:  Procedure Laterality Date  . APPENDECTOMY    . ESOPHAGOGASTRODUODENOSCOPY  11/23/2011   Procedure: ESOPHAGOGASTRODUODENOSCOPY (EGD);  Surgeon: Landry Dyke, MD;  Location: [facility];  Service: Endoscopy;  Laterality: N/A;  . TUBAL LIGATION     Family History:  Family History  Problem Relation Age of Onset  . Diabetes Mother   . Lung cancer Father   . Heart failure Father    Social History:  Social History   Social History  . Marital status: Divorced    Spouse  name: N/A  . Number of children: N/A  . Years of education: N/A   Social History Main Topics  . Smoking status: Never Smoker  . Smokeless tobacco: Never Used  . Alcohol use No  . Drug use: No  . Sexual activity: Yes   Other Topics Concern  . None   Social History Narrative  . None   Additional History: NCCSRS negative  Assessment:  1 Musculoskeletal: Strength & Muscle Tone: within normal limits Gait & Station: normal Patient leans: N/A  Psychiatric Specialty Exam:   Review of Systems Gastrointestinal:  GERD hx  Musculoskeletal: Positive for back pain NO COMPLAINT TODAY.HX OFTension HAs cant take Tylenol due to Liver per her GI Negative for falls.  Neurological: Positive for tremors no change. Negative for dizziness, tingling, sensory change, speech change, focal weakness, seizures, loss of consciousness and weakness.  Endo/Heme/Allergies:No Polydipsia,polyphagia;polyuria: On Insulin and metformin for Diabetes. A1C at goal by hx.AM sugar 124 Psychiatric/Behavioral: Negative for depression, suicidal ideas, hallucinations, memory loss and substance  abuse. The patient c/o Dr phobia per HPI The patient does not have insomnia.     Blood pressure 116/74, pulse 78, resp. rate 16, height 5\' 6"  (1.676 m), weight 201 lb (91.2 kg), SpO2 94 %.Body mass index is 32.44 kg/m.  General Appearance: Neat;smiling;Dressed up and made up-new boyfriend still in k her life   Eye Contact:  Good  Speech:  Clear and Coherent  Volume:  Normal  Mood:  Mixed-euthymic;dysphoric  Affect:  Congruent  Thought Process:  Coherent and Descriptions of Associations: Intact  Orientation:  Full (Time, Place, and Person)  Thought Content:  WDL and Anxious obsessions  Suicidal Thoughts:  No  Homicidal Thoughts:  No  Memory:  Negative  Judgement:  Fair  Insight:  Lacking needs counseling  Psychomotor Activity:  WNL  Concentration:  Good  Recall:  Good  Fund of Knowledge: Fair  Language: Fair  Akathisia:   NA  Handed:  Right  AIMS (if indicated):  NA  Assets:  Desire for Improvement Financial Resources/Insurance Housing Social Support Transportation  ADL's:  Intact  Cognition: WNL  Sleep:  Normal   Is the patient at risk to self?  No. Has the patient been a risk to self in the past 6 months?  No. Has the patient been a risk to self within the distant past?  No. Is the patient a risk to others?  No. Has the patient been a risk to others in the past 6 months?  No. Has the patient been a risk to others within the distant past?  No.  Current Medications: Current Outpatient Prescriptions  Medication Sig Dispense Refill  . albuterol (PROVENTIL HFA;VENTOLIN HFA) 108 (90 BASE) MCG/ACT inhaler Inhale 2 puffs into the lungs every 6 (six) hours as needed for wheezing.    Marland Kitchen amitriptyline (ELAVIL) 10 MG tablet     . buPROPion (WELLBUTRIN SR) 100 MG 12 hr tablet Take 1 tablet (100 mg total) by mouth 2 (two) times daily. 60 tablet 2  . clonazePAM (KLONOPIN) 0.5 MG tablet TID and HS 120 tablet 2  . dicyclomine (BENTYL) 20 MG tablet Take 20 mg by mouth every morning.    . docusate sodium (COLACE) 100 MG capsule Take 100 mg by mouth 2 (two) times daily.     . fluticasone (FLONASE) 50 MCG/ACT nasal spray Place 2 sprays into the nose daily as needed for allergies.    Marland Kitchen gabapentin (NEURONTIN) 600 MG tablet Take 600 mg by mouth 4 (four) times daily.     Marland Kitchen HYDROcodone-acetaminophen (NORCO) 10-325 MG tablet     . insulin NPH-insulin regular (NOVOLIN 70/30) (70-30) 100 UNIT/ML injection Inject 20-26 Units into the skin 2 (two) times daily. He takes 26 units at 7am and 20 units at 7pm.    . levothyroxine (SYNTHROID, LEVOTHROID) 150 MCG tablet Take 150 mcg by mouth daily before breakfast.     . lidocaine (LIDODERM) 5 % Place 1 patch onto the skin daily as needed (For pain.). Remove & Discard patch within 12 hours or as directed by MD    . lovastatin (MEVACOR) 20 MG tablet Take 20 mg by mouth every morning.      . meloxicam (MOBIC) 7.5 MG tablet     . metFORMIN (GLUCOPHAGE) 1000 MG tablet     . Multiple Vitamin (MULITIVITAMIN WITH MINERALS) TABS Take 1 tablet by mouth daily with lunch.     . OLANZapine (ZYPREXA) 20 MG tablet Take 1 tablet (20 mg total) by mouth at bedtime. [place]  tablet 2  . polyethylene glycol (MIRALAX / GLYCOLAX) packet Take 17-34 g by mouth every morning.     . propranolol (INDERAL) 10 MG tablet Take as needed for panic 2x/day 60 tablet 2   No current facility-administered medications for this visit.     Medical Decision Making:  Established Problem, Stable/Improving (1), Review of Last Therapy Session (1) and Review of Medication Regimen & Side Effects (2)  Treatment Plan Summary: PTSD Continue Wellbutrin/Klonopin-continue counseling especially with anxieties around Doctor visits  Bipolar DO/Anxiety Continue Zyprexa and Klonopin  Panic Propranolo 10 mg bid prn  Grief/Codependency Begin counseling  Headaches  ASA 2-3 PRN 1-2 x  Diabetes Continue FU with Diabetes MD/continue to follow in Care Everywhere  FU 3 months  This was a 15 minute face to face visit  [name] PA 07/19/2016, 10:50 AM

## 2016-08-07 ENCOUNTER — Ambulatory Visit (HOSPITAL_COMMUNITY): Payer: Self-pay | Admitting: Licensed Clinical Social Worker

## 2016-08-14 ENCOUNTER — Ambulatory Visit (INDEPENDENT_AMBULATORY_CARE_PROVIDER_SITE_OTHER): Payer: Medicare Other | Admitting: Licensed Clinical Social Worker

## 2016-08-14 DIAGNOSIS — F419 Anxiety disorder, unspecified: Secondary | ICD-10-CM | POA: Diagnosis not present

## 2016-08-14 DIAGNOSIS — F410 Panic disorder [episodic paroxysmal anxiety] without agoraphobia: Secondary | ICD-10-CM | POA: Diagnosis not present

## 2016-08-14 DIAGNOSIS — F4310 Post-traumatic stress disorder, unspecified: Secondary | ICD-10-CM

## 2016-08-14 NOTE — Progress Notes (Signed)
   THERAPIST PROGRESS NOTE  Session Time: 2 PM to 2:50 p.m.  Participation Level: Active  Behavioral Response: CasualAlertDysphoric  Type of Therapy: Individual Therapy  Treatment Goals addressed:   learn stress management skills, decrease anxiety and panic  Interventions: Solution Focused, Strength-based, Supportive and Reframing,stress management  Summary: Cheryl [name] is a 60 y.o. female who presents with dealing with problems day in and day out. Her day did not start off good today because of fighting with boyfriend. Recognizes that in dealing with her daughter who has addiction issues that if she is not willing to for help she can't make her since she is an adult. Described ongoing behaviors of daughter that she is going to "explode" because she gets so ill. Patient recognizes that she can't do anything about it so she stays calm and tries to keep her daughter calm. Shared other stressors. She can't read related to eye. She can't see well out of left eye. Discussed medical issues and financial stressors related to medical problems and to expenses in general. All the bills coming in are on her mind continuously. Worried about her eye getting fixed. Would like to move but rent is too expensive. She is open to retirement community if things do not get better. Reviewed pros and cons of helping her daughter who has addiction issues and still can't do it because she is worried something would happen if she forces to leave the house and relies on her to pay bills. Discusses issues with boyfriend who "nick picks" and became tearful when telling therapist that she was not invited for Thanksgiving dinner with his family. Reviewed  pros and cons of relationship. She is more relaxed even with Lovey Newcomer even with her addiction issues. She is dreading this month and will be glad when it is over. She doesn't see kids, and doesn't have enough money for holidays. She shared she does not have enough money to do  positive things for herself. Reviewed areas in patient's life where she finds simple joys but patient has difficulty identifying related to her current stressors.       Suicidal/Homicidal: No  Therapist Response: Reviewed patient's symptoms and stressors. Continue to explore whether enabling as best option for patient by exploring pros and cons of helping her daughter. Provided supportive interventions around patient's stressors and struggles with daughter. Discussed that even with helping her daughter she still has to consider losing her daughter to addiction.  Discussed how applying healthier coping strategies are more effective way to manage life and bring more generally and satisfaction. Explored and encouraged patient to put some distance between her and daughter to manage stressor. Discussed how simple joys in life can be most satisfying and explored with patient to increase awareness of some of the positive experiences in her life. Encouraged patient to engage in some activities for herself that will increase positive emotions. Utilized supportive and strength based interventions. Utilize problem solving behaviors and encouraged patient to be proactive in dealing with issues. Helped patient to process emotions around stressors and reviewed pros and cons of her current relationship to help her with better insight to relationship.  Plan: Return again in 4 weeks.2.patient continued to gain insight to effective coping strategies and stress management strategies to manage mood and stressors  Diagnosis: Axis I:  Panic disorder, chronic anxiety, PTSD    Axis II: No diagnosis    Bowman,[name], LCSW 08/14/2016

## 2016-09-24 ENCOUNTER — Ambulatory Visit (INDEPENDENT_AMBULATORY_CARE_PROVIDER_SITE_OTHER): Payer: Medicare Other | Admitting: Licensed Clinical Social Worker

## 2016-09-24 DIAGNOSIS — F419 Anxiety disorder, unspecified: Secondary | ICD-10-CM | POA: Diagnosis not present

## 2016-09-24 DIAGNOSIS — F4310 Post-traumatic stress disorder, unspecified: Secondary | ICD-10-CM | POA: Diagnosis not present

## 2016-09-24 DIAGNOSIS — F410 Panic disorder [episodic paroxysmal anxiety] without agoraphobia: Secondary | ICD-10-CM | POA: Diagnosis not present

## 2016-09-24 NOTE — Progress Notes (Signed)
   THERAPIST PROGRESS NOTE  Session Time: 2 PM to 2:50 PM  Participation Level: Active  Behavioral Response: CasualAlertDysphoric and Irritable  Type of Therapy: Individual Therapy  Treatment Goals addressed:  learn stress management skills, decrease anxiety and panic  Interventions: Solution Focused, Strength-based, Supportive and Other: Healthy relationship skills, stress management  Summary: SAMONE DISANDRO is a 61 y.o. female who presents with discussing stress of her relationship. He will pick, she feels suffocated and she needs more freedom. Discussed that she has nowhere else to go to get away from stress as she deals with stress at her own home with her daughter and then stress when she goes to stay with boyfriend, [name]. Related that blood pressure is higher because of stress. She describes both of them is needy. She related that it bothers her that boyfriend does not know how to say no and his own relationships and she doesn't feel valued. Discussed with therapist speaking up for how she feels. Patient reviewed pros and cons and the relationship and describes some positive aspects. She described starting to set some boundaries with daughter and it wasn't so hard. Discussed with therapist that is important to take care of self so being assertive and setting boundaries was healthy. Patient relates that she can see the cycle continue so she is going to have to do something and they they get mad "oh well". Discussed with therapist that it's important to take care of our needs.   Suicidal/Homicidal: No  Therapist Response: Reviewed symptoms and help patient express and process feelings around stressors. Used processing of feelings in session as a coping strategy for patient to manage emotions and stressors. Discussed implementation of healthy relationship skills with stress and relationship including communicating not feeling valued and speaking up for her needs that include wanting space.  Worked on building insight lack of control of some of the issues in relationship to help in coping. Continue to work with her on enabling behaviors with her daughter and provided positive feedback for limits and assertive skills she has started to. Reviewed pros and cons of relationship and what are steps she can work on to implement change. Helped patient with problem solving and provided strength based and supportive interventions.  Plan: Return again in 2 weeks.2. Patient continued to gain insight to healthy coping and stress management and implement into life situations  Diagnosis: Axis I:  Panic disorder, chronic anxiety, PTSD    Axis II: No diagnosis    Bowman,[name], LCSW 09/24/2016

## 2016-10-08 ENCOUNTER — Ambulatory Visit (INDEPENDENT_AMBULATORY_CARE_PROVIDER_SITE_OTHER): Payer: Medicare Other | Admitting: Licensed Clinical Social Worker

## 2016-10-08 DIAGNOSIS — F4310 Post-traumatic stress disorder, unspecified: Secondary | ICD-10-CM | POA: Diagnosis not present

## 2016-10-08 DIAGNOSIS — F410 Panic disorder [episodic paroxysmal anxiety] without agoraphobia: Secondary | ICD-10-CM

## 2016-10-08 DIAGNOSIS — F419 Anxiety disorder, unspecified: Secondary | ICD-10-CM | POA: Diagnosis not present

## 2016-10-08 NOTE — Progress Notes (Signed)
   THERAPIST PROGRESS NOTE  Session Time: 9 AM to 9:50 AM  Participation Level: Active  Behavioral Response: CasualAlertEuthymic  Type of Therapy: Individual Therapy  Treatment Goals addressed:  learn stress management skills, decrease anxiety and panic  Interventions: CBT, Solution Focused, Strength-based, Supportive and Other: Healthy interpersonal relationships, stress management  Summary: Cheryl [name] is a 61 y.o. female who presents with continuing issues with her relationship with [name] and discussed her continuing frustrations. Related that he is controlling, he picks, does not see some of his dysfunctional behavior related to his family he criticizes her and her family relationship. She relates she does not feel she gets the same consideration as his family. She describes feeling stressed but also relieved about distancing herself from [name]. Reviewed pros and cons of relationship and said that there is too much wrong with the relationship to be happy. She relates being pulled into directions as her stress at home and stress in her relationship with [name]. She does not feel she has the option of going on her own although she said she would if she could. Described feelings of anger about how he treats his children compared to her. Feels life is stressful and always be this way. 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 Patient said she is grateful to have a roof overhead. Skills some of the stressors of her relationship with children.  Suicidal/Homicidal: No  Therapist Response: Help patient process feelings around stressors and help to engage in problem solving. Identified that she feels pulled in 2 directions and encouraged patient to consider other options like going out on her own. Discussed pros and cons of her relationship, help patient process frustrations of relationship, validated patient will she was feeling  and how she deserved to be treated as someone with value. Asked her to consider if she got anything from the relationship that would make it worth her holding onto it. Challenged patient on thought distortion of permanence and that things would always be the way they are now and discussed one of the realities of life is that things change. Encouraged patient to identify things she is grateful for. Utilized strength based and supportive interventions.   Plan: Return again in 2 weeks.2. Patient came inside and apply healthy stress management skills and healthy interpersonal skills for relationships. 3. Patient identified a regular basis 3 things she is grateful for  Diagnosis: Axis I:  Panic disorder, chronic anxiety, PTSD    Axis II: No diagnosis    Aquila Menzie A, LCSW 10/08/2016

## 2016-10-11 ENCOUNTER — Ambulatory Visit (INDEPENDENT_AMBULATORY_CARE_PROVIDER_SITE_OTHER): Payer: Medicare Other | Admitting: Medical

## 2016-10-11 ENCOUNTER — Encounter (HOSPITAL_COMMUNITY): Payer: Self-pay | Admitting: Medical

## 2016-10-11 VITALS — BP 126/74 | HR 80 | Resp 16 | Ht 66.0 in | Wt 201.0 lb

## 2016-10-11 DIAGNOSIS — E119 Type 2 diabetes mellitus without complications: Secondary | ICD-10-CM | POA: Diagnosis not present

## 2016-10-11 DIAGNOSIS — Z639 Problem related to primary support group, unspecified: Secondary | ICD-10-CM | POA: Insufficient documentation

## 2016-10-11 DIAGNOSIS — Z794 Long term (current) use of insulin: Secondary | ICD-10-CM | POA: Diagnosis not present

## 2016-10-11 DIAGNOSIS — G894 Chronic pain syndrome: Secondary | ICD-10-CM | POA: Diagnosis not present

## 2016-10-11 DIAGNOSIS — F410 Panic disorder [episodic paroxysmal anxiety] without agoraphobia: Secondary | ICD-10-CM

## 2016-10-11 DIAGNOSIS — F4310 Post-traumatic stress disorder, unspecified: Secondary | ICD-10-CM | POA: Diagnosis not present

## 2016-10-11 DIAGNOSIS — F319 Bipolar disorder, unspecified: Secondary | ICD-10-CM

## 2016-10-11 DIAGNOSIS — F419 Anxiety disorder, unspecified: Secondary | ICD-10-CM | POA: Diagnosis not present

## 2016-10-11 MED ORDER — CLONAZEPAM 0.5 MG PO TABS: ORAL_TABLET | ORAL | 2 refills | Status: DC

## 2016-10-11 MED ORDER — BUPROPION HCL ER (SR) 100 MG PO TB12: 100.0000 mg | ORAL_TABLET | Freq: Two times a day (BID) | ORAL | 2 refills | Status: DC

## 2016-10-11 MED ORDER — OLANZAPINE 20 MG PO TABS: 20.0000 mg | ORAL_TABLET | Freq: Every day | ORAL | 2 refills | Status: DC

## 2016-10-11 NOTE — Progress Notes (Signed)
BH MD/PA/NP OP Progress Note  10/11/2016 2:23 PM ADILENI BROOKER  MRN:  [id number]  Subjective: "I'm aggravated" HPI: Pt returns for FU for her Bipolar I ;PTSD and GAD. She reports continued improvement with medications Still finds daughter stressful and boyfriend is now an "ex" after sending her belongings to her by his Granddaughters after she chose to go home for a night. She feels picked and pulled on by a daughetr she is forced to live with due to economics and a boyfriend who asks why she hasnt called after he send her things back to her. She continues in counseling every 2 weeks and says [name] "helps me a lot" . NO C/O POLYURIA;POLYPHAGIA;POLYDIPSIA   Chief Complaint    Follow-up; Manic Behavior; Trauma; Stress; Anxiety; Family Problem     Visit Diagnosis:           P   ICD-9-CM ICD-10-CM   PL                1.   Bipolar 1 disorder ([facility]) 296.7 F31.9       2.   PTSD (post-traumatic stress disorder) 309.81 F43.10       3.   Chronic anxiety 300.00 F41.9       4.   Panic disorder 300.01 F41.0       5.   Insulin dependent type 2 diabetes mellitus, controlled ([place]) 250.00... E11.9.Marland KitchenMarland Kitchen                Past Medical History:  Past Medical History:  Diagnosis Date  . Anxiety   . Arthritis    back, hands  . Bipolar affective ([place])   . Blood transfusion   . Depression   . Diabetes mellitus   . Headache(784.0)   . High blood pressure   . High cholesterol   . History of DVT of lower extremity    left, after pregnancy  . Hypothyroidism   . Pneumonia    11/2011  . Recurrent upper respiratory infection (URI)   . Schizophrenia ([place])   . Shortness of breath    with exertion  . Suicidal ideation   . Thyroid disease     Past Surgical History:  Procedure Laterality Date  . APPENDECTOMY    . ESOPHAGOGASTRODUODENOSCOPY  11/23/2011   Procedure: ESOPHAGOGASTRODUODENOSCOPY (EGD);  Surgeon: Landry Dyke, MD;  Location: [facility];  Service: Endoscopy;  Laterality: N/A;  . TUBAL  LIGATION     Family History:  Family History  Problem Relation Age of Onset  . Diabetes Mother   . Lung cancer Father   . Heart failure Father    Social History:  Social History   Social History  . Marital status: Divorced    Spouse name: N/A  . Number of children: N/A  . Years of education: N/A   Social History Main Topics  . Smoking status: Never Smoker  . Smokeless tobacco: Never Used  . Alcohol use No  . Drug use: No  . Sexual activity: Yes   Other Topics Concern  . None   Social History Narrative  . None   Additional History: NCCSRS negative   Psychiatric Specialty Exam:   Review of Systems Gastrointestinal:  GERD hx  Musculoskeletal:  NO COMPLAINT of back pain TODAY.HX OFTension HAs cant take Tylenol due to Liver per her GI no c/o today Negative for falls.  Neurological: Positive for tremors no change. Negative for dizziness, tingling, sensory change, speech change, focal weakness, seizures, loss of consciousness  and weakness.  Endo/Heme/Allergies:No Polydipsia,polyphagia;polyuria: On Insulin and metformin for Diabetes.Continues to manage well. A1C at goal by hx.AM sugar 124 Psychiatric/Behavioral: Negative for depression, suicidal ideas, hallucinations, memory loss and substance abuse. The patient happy with curent meds  The patient does not have insomnia.     Blood pressure 126/74, pulse 80, resp. rate 16, height 5\' 6"  (1.676 m), weight 201 lb (91.2 kg), SpO2 94 %.Body mass index is 32.44 kg/m.  General Appearance: Neat;smiling;Dressed up and made up-new boyfriend still in k her life   Eye Contact:  Good  Speech:  Clear and Coherent  Volume:  Normal  Mood:  Mixed-euthymic;dysphoric  Affect:  Congruent  Thought Process:  Coherent and Descriptions of Associations: Intact  Orientation:  Full (Time, Place, and Person)  Thought Content:  WDL and Anxious obsessions  Suicidal Thoughts:  No  Homicidal Thoughts:  No  Memory:  Negative  Judgement:  Fair    Insight:  Lacking needs counseling  Psychomotor Activity:  WNL  Concentration:  Good  Recall:  Good  Fund of Knowledge: Fair  Language: Fair  Akathisia:  NA  Handed:  Right  AIMS (if indicated):  NA  Assets:  Desire for Improvement Financial Resources/Insurance Housing Social Support Transportation  ADL's:  Intact  Cognition: WNL  Sleep:  Normal  Musculoskeletal: Strength & Muscle Tone: within normal limits Gait & Station: normal Patient leans: N/A    Current Outpatient Prescriptions  Medication Sig Dispense Refill  . albuterol (PROVENTIL HFA;VENTOLIN HFA) 108 (90 BASE) MCG/ACT inhaler Inhale 2 puffs into the lungs every 6 (six) hours as needed for wheezing.    Marland Kitchen amitriptyline (ELAVIL) 10 MG tablet     . buPROPion (WELLBUTRIN SR) 100 MG 12 hr tablet Take 1 tablet (100 mg total) by mouth 2 (two) times daily. 60 tablet 2  . clonazePAM (KLONOPIN) 0.5 MG tablet TID and HS 120 tablet 2  . dicyclomine (BENTYL) 20 MG tablet Take 20 mg by mouth every morning.    . docusate sodium (COLACE) 100 MG capsule Take 100 mg by mouth 2 (two) times daily.     . fluticasone (FLONASE) 50 MCG/ACT nasal spray Place 2 sprays into the nose daily as needed for allergies.    Marland Kitchen gabapentin (NEURONTIN) 600 MG tablet Take 600 mg by mouth 4 (four) times daily.     . insulin NPH-insulin regular (NOVOLIN 70/30) (70-30) 100 UNIT/ML injection Inject 20-26 Units into the skin 2 (two) times daily. He takes 26 units at 7am and 20 units at 7pm.    . levothyroxine (SYNTHROID, LEVOTHROID) 150 MCG tablet Take 150 mcg by mouth daily before breakfast.     . lidocaine (LIDODERM) 5 % Place 1 patch onto the skin daily as needed (For pain.). Remove & Discard patch within 12 hours or as directed by MD    . lovastatin (MEVACOR) 20 MG tablet Take 20 mg by mouth every morning.    . meloxicam (MOBIC) 7.5 MG tablet     . metFORMIN (GLUCOPHAGE) 1000 MG tablet     . Multiple Vitamin (MULITIVITAMIN WITH MINERALS) TABS Take 1 tablet  by mouth daily with lunch.     . OLANZapine (ZYPREXA) 20 MG tablet Take 1 tablet (20 mg total) by mouth at bedtime. 30 tablet 2  . polyethylene glycol (MIRALAX / GLYCOLAX) packet Take 17-34 g by mouth every morning.     . propranolol (INDERAL) 10 MG tablet Take as needed for panic 2x/day 60 tablet 2  . acyclovir (ZOVIRAX)  400 MG tablet     . atenolol (TENORMIN) 100 MG tablet     . atenolol (TENORMIN) 25 MG tablet     . HYDROcodone-acetaminophen (NORCO) 7.5-325 MG tablet Take by mouth.    Marland Kitchen ketorolac (ACULAR) 0.5 % ophthalmic solution     . lisinopril (PRINIVIL,ZESTRIL) 10 MG tablet      No current facility-administered medications for this visit.     Medical Decision Making:  Established Problem, Stable/Improving (1), Review of Last Therapy Session (1) and Review of Medication Regimen & Side Effects (2)  Treatment Plan Summary:  PTSD Continue Wellbutrin/Klonopin-continue counseling especially with anxieties around Doctor visits  Panic Propranolol 10 mg bid prn  Grief/Codependency/PTSD  Continue counseling  Chronic Pain Per Dr Sheryle Hail  Diabetes Continue FU with Diabetes MD/continue to follow in Care Everywhere  Relationship stress/living situation [facility] Print out f given  FU 3 months  This was a 15 minute face to face visit  [name] PA 10/11/2016, 2:23 PM

## 2016-10-22 ENCOUNTER — Ambulatory Visit (INDEPENDENT_AMBULATORY_CARE_PROVIDER_SITE_OTHER): Payer: Medicare Other | Admitting: Licensed Clinical Social Worker

## 2016-10-22 DIAGNOSIS — F419 Anxiety disorder, unspecified: Secondary | ICD-10-CM | POA: Diagnosis not present

## 2016-10-22 DIAGNOSIS — F410 Panic disorder [episodic paroxysmal anxiety] without agoraphobia: Secondary | ICD-10-CM

## 2016-10-22 DIAGNOSIS — F4310 Post-traumatic stress disorder, unspecified: Secondary | ICD-10-CM | POA: Diagnosis not present

## 2016-10-22 NOTE — Progress Notes (Signed)
   THERAPIST PROGRESS NOTE  Session Time: 11 AM to 11:50 AM  Participation Level: Active  Behavioral Response: CasualAlertAnxious  Type of Therapy: Individual Therapy  Treatment Goals addressed:  learn stress management skills, decrease anxiety and panic  Interventions: Motivational Interviewing, Solution Focused, Strength-based, Supportive and Family Systems  Summary: Cheryl [name] is a 61 y.o. female who presents with things getting worse with daughter. She is unpredictable, taking more money and more aggressive. Updated treatment plan and symptoms of anxiety and panic remain severe as she continues to have to deal with significant stressors. Reviewed pros and cons of different strategies to handle her daughter and patient said she was worried more for her daughter but now more about herself and not feeling safe because of unpredictable behaviors and daughter's addiction. She doesn't sleep at night and is tense all the time because she doesn't know what's going to happen. She described stress in her relationship with boyfriend and his unpredictable behaviors caused her not to be able to depend on him. Patient related that it's getting to the point where if her daughter doesn't get help they are going to have to separate. Related that she is going to have to stand firm and what she thinks with daughter. Discussed with patient sharing some of her concerns and fears and being honest so that her relationship can be more of a support.    Suicidal/Homicidal: No  Therapist Response: Reviewed patient's progress and updated treatment plan. Patient's stressors remain severe which has caused anxiety and panic to continue as severe. Provided psychoeducation on addiction and utilized motivational strategies to help patient in making healthy choices for herself and motivating her to make positive changes. Positively reinforced patient's insight that she has to consider her needs and safety first. Encouraged  communication with her relationship with boyfriend in order to have that be a more helpful support for her. Encouraged patient to take steps to distance herself more from daughter in order to protect herself. Provided supportive and strength-based interventions.   Plan: Return again in 2 weeks.2. Patient gain insight and apply healthy stress management skills and self-care to make healthy choices in her life  Diagnosis: Axis I:  Panic disorder, chronic anxiety, PTSD    Axis II: No diagnosis    Jermaine Tholl A, LCSW 10/22/2016

## 2016-11-05 ENCOUNTER — Ambulatory Visit (HOSPITAL_COMMUNITY): Payer: Self-pay | Admitting: Licensed Clinical Social Worker

## 2016-11-12 ENCOUNTER — Ambulatory Visit (INDEPENDENT_AMBULATORY_CARE_PROVIDER_SITE_OTHER): Payer: Medicare Other | Admitting: Licensed Clinical Social Worker

## 2016-11-12 DIAGNOSIS — F4310 Post-traumatic stress disorder, unspecified: Secondary | ICD-10-CM | POA: Diagnosis not present

## 2016-11-12 DIAGNOSIS — F419 Anxiety disorder, unspecified: Secondary | ICD-10-CM | POA: Diagnosis not present

## 2016-11-12 DIAGNOSIS — F410 Panic disorder [episodic paroxysmal anxiety] without agoraphobia: Secondary | ICD-10-CM | POA: Diagnosis not present

## 2016-11-12 NOTE — Progress Notes (Signed)
   THERAPIST PROGRESS NOTE  Session Time: 10 AM to 10:50 AM  Participation Level: Active  Behavioral Response: CasualAlertEuthymic  Type of Therapy: Individual Therapy  Treatment Goals addressed:  learn stress management skills, decrease anxiety and panic Interventions: Motivational Interviewing, Solution Focused, Strength-based, Supportive and Other: Drug and alcohol counseling, healthy interpersonal skills  Summary: Cheryl [name] is a 61 y.o. female who presents with sharing with therapist that she doesn't know one day to the next what things will be like. Shared that she stood her ground with her daughter on Friday and that she hated doing it but feels she had no choice and feels better now that she has done it. Describes her feelings as being hurt and furious. Recognizes she's always being manipulated and needs to take steps to be self protective. Discussed with therapist but enabling doesn't help her daughter and is harmful to herself as well. Discussed that patient is being supportive but that she will not support daughter's addiction. Discussed her feelings with her boyfriend and that when she is mad she doesn't want to talk to anyone and does not want to hurt anyone with her anger. Discussed struggles in relationship related to her boyfriend not understanding ways he can be supportive. Viewed session and patient identified that she is standing her ground and feels good and she has good communication strategies in communicating her feelings to her boyfriend.  Suicidal/Homicidal: No  Therapist Response: Reviewed patient's progress and symptoms. Provided education about addiction and reiterated that enabling is not helpful for her daughter or patient. Provided positive feedback about's patient's insight that enabling her daughter has become harmful to pateint to point where patient does not have money to pay bills and jeopardizing her own medical care. Provided positive feedback for patient  about taking a stand for herself and related that is important to be self protective, and pointed out that the patient is initiating positive change and dynamic with daughter as she is becoming more assertive instead of daughter being the one in control. Discussed more aspects of addition including self-centeredness and that leads to the addict not caring about themselves or others. Pointed out that taking self protective measures is helping with her anxiety and also provided positive feedback for healthy interpersonal skills. Provided supportive and strength-based interventions.  Plan: Return again in 2 weeks.2. Patient gain insight to healthy coping strategies to help with anxiety and panic and continue to gain insight and implement healthy interpersonal relationships strategies  Diagnosis: Axis I:  Panic disorder, chronic anxiety, PTSD    Axis II: No diagnosis    Bowman,[name], LCSW 11/12/2016

## 2016-11-27 ENCOUNTER — Ambulatory Visit (INDEPENDENT_AMBULATORY_CARE_PROVIDER_SITE_OTHER): Payer: Medicare Other | Admitting: Licensed Clinical Social Worker

## 2016-11-27 DIAGNOSIS — F4310 Post-traumatic stress disorder, unspecified: Secondary | ICD-10-CM

## 2016-11-27 DIAGNOSIS — F419 Anxiety disorder, unspecified: Secondary | ICD-10-CM

## 2016-11-27 DIAGNOSIS — F410 Panic disorder [episodic paroxysmal anxiety] without agoraphobia: Secondary | ICD-10-CM

## 2016-11-27 NOTE — Progress Notes (Signed)
   THERAPIST PROGRESS NOTE  Session Time: 10 AM to 10:50 AM  Participation Level: Active  Behavioral Response: CasualAlertAngry, Anxious and Irritable  Type of Therapy: Individual Therapy  Treatment Goals addressed:  learn stress management skills, decrease anxiety and panic  Interventions: Solution Focused, Strength-based, Supportive, Family Systems, Reframing and Other: Stress management  Summary: Cheryl [name] is a 61 y.o. female who presents with putting her foot down again and mad for three days. Patient was upset and then daughter got mad and left to stay with her boyfriend. They are coming back tonight or tomorrow and she is stressed about having to deal with her daughter again. She described panic and anxiety often and concerned how this will impact her health. Patient does endorse there being some changes in regards to her relationship with daughter and that she is not letting her get away with things anymore and putting her foot down. She is being firmer because she can't trust her anymore. Recognizes that giving in to her daughter is only feeing into her addiction. Agrees with therapist the putting her foot down is a positive step even though it has been stressful. Things are better with [name] although there are still issues in the relationship. She is considering moving in with him or she relates she "may end up in a mental hospital". Patient reviewed session and said that most helpful is that it provides a release that she needs.  Suicidal/Homicidal: No  Therapist Response: Help patient to process feelings around stressors. Worked on stress management and problem solving. Validated patient on putting her foot down as a positive step in taking care of self, protecting self as well as how to deal with someone who is in their addiction. Encouraged patient not to take responsibility for her daughter's choices and recognize her daughter is responsible for her life and choices. Discussed  engaging in positive activities and distractions as strategies to help with managing emotions. Encouraged communication and continuing to make progress with relationship with boyfriend. Provided strength based and supportive interventions.  Plan: Return again in 2 weeks.2. Patient continue to gain insight and apply strategies to manage stressors.3.Patient continue to take steps to make herself a priority and take care of self.  Diagnosis: Axis I:  Panic disorder, chronic anxiety, PTSD    Axis II: No diagnosis    Little Winton A, LCSW 11/27/2016

## 2016-12-18 ENCOUNTER — Ambulatory Visit (INDEPENDENT_AMBULATORY_CARE_PROVIDER_SITE_OTHER): Payer: Medicare Other | Admitting: Licensed Clinical Social Worker

## 2016-12-18 DIAGNOSIS — F4310 Post-traumatic stress disorder, unspecified: Secondary | ICD-10-CM

## 2016-12-18 DIAGNOSIS — F419 Anxiety disorder, unspecified: Secondary | ICD-10-CM

## 2016-12-18 DIAGNOSIS — F410 Panic disorder [episodic paroxysmal anxiety] without agoraphobia: Secondary | ICD-10-CM

## 2016-12-18 NOTE — Progress Notes (Signed)
   THERAPIST PROGRESS NOTE  Session Time: 9 AM to 9:50 AM  Participation Level: Active  Behavioral Response: CasualAlertAngry and Anxious  Type of Therapy: Individual Therapy  Treatment Goals addressed:  learn stress management skills, decrease anxiety and panic  Interventions: Motivational Interviewing, Solution Focused, Strength-based, Supportive and Family Systems  Summary: Cheryl [name] is a 62 y.o. female who presents with went to [place] clinic with daughter. She was helpful but now has significant doubts based on her daughter's behavior for asking for money that daughter will make positive steps in addressing her addiction. Putting her foot down. She feels good about putting her foot down, sees behavior that make her suspicious. She is not going to make things easy if she is using drugs, motivated to not give her money for drugs, stressful since when she wakes up, and can't get away fromm it. She recognizes that she doesn't have control over her, and up to her. She is  thinking about moving in with [facility]. Talked to her doctor and realizes that stress is impacting her health. She is in a position where she realizes that she has to take care of herself. She plans to stay firm. She has been pushed too far and realizes she has been manipulated. Patient is going to determine how she is going to live life. She relates that "it is not on me" because she gave and gave and it is up to daughter to make choices and she is responsible for her life.      Suicidal/Homicidal: No  Therapist Response: Therapist provided patient education about addiction to help patient understand daughter's behaviors and help with coping. Explained that daughter is at ambivalent stages which is why she has resistant behaviors. Provided positive feedback for patient taking a firm stand and helping her understand that giving into the patient only is helping her addiction. Provided  positive feedback for patient's attitude of  recognizing that daughter's choices are not her responsibility, recognizing that stress puts her health at risk and taking steps to take care of self as good coping. Provided supportive and strength-based interventions.    Plan: 1. Patient to schedule appointment once therapist returns from leave.2. Patient continue to gain insight and apply strategies to manage stressors.3.Patient continue to take steps to make herself a priority and take care of self.   Diagnosis: Axis I:  Panic disorder, chronic anxiety, PTSD    Axis II: No diagnosis    Jaser Fullen A, LCSW 12/18/2016

## 2016-12-27 ENCOUNTER — Encounter (HOSPITAL_COMMUNITY): Payer: Self-pay | Admitting: Medical

## 2016-12-27 ENCOUNTER — Ambulatory Visit (INDEPENDENT_AMBULATORY_CARE_PROVIDER_SITE_OTHER): Payer: Medicare Other | Admitting: Medical

## 2016-12-27 VITALS — BP 120/80 | HR 85 | Resp 16 | Ht 66.0 in | Wt 200.0 lb

## 2016-12-27 DIAGNOSIS — F419 Anxiety disorder, unspecified: Secondary | ICD-10-CM | POA: Diagnosis not present

## 2016-12-27 DIAGNOSIS — Z794 Long term (current) use of insulin: Secondary | ICD-10-CM

## 2016-12-27 DIAGNOSIS — E119 Type 2 diabetes mellitus without complications: Secondary | ICD-10-CM

## 2016-12-27 DIAGNOSIS — F410 Panic disorder [episodic paroxysmal anxiety] without agoraphobia: Secondary | ICD-10-CM

## 2016-12-27 DIAGNOSIS — F319 Bipolar disorder, unspecified: Secondary | ICD-10-CM | POA: Diagnosis not present

## 2016-12-27 DIAGNOSIS — Z639 Problem related to primary support group, unspecified: Secondary | ICD-10-CM | POA: Diagnosis not present

## 2016-12-27 DIAGNOSIS — Z6372 Alcoholism and drug addiction in family: Secondary | ICD-10-CM | POA: Diagnosis not present

## 2016-12-27 DIAGNOSIS — G894 Chronic pain syndrome: Secondary | ICD-10-CM

## 2016-12-27 DIAGNOSIS — F4310 Post-traumatic stress disorder, unspecified: Secondary | ICD-10-CM | POA: Diagnosis not present

## 2016-12-27 MED ORDER — OLANZAPINE 20 MG PO TABS: 20.0000 mg | ORAL_TABLET | Freq: Every day | ORAL | 2 refills | Status: DC

## 2016-12-27 MED ORDER — PROPRANOLOL HCL 10 MG PO TABS: ORAL_TABLET | ORAL | 2 refills | Status: DC

## 2016-12-27 MED ORDER — BUPROPION HCL ER (SR) 100 MG PO TB12: 100.0000 mg | ORAL_TABLET | Freq: Two times a day (BID) | ORAL | 0 refills | Status: DC

## 2016-12-27 MED ORDER — CLONAZEPAM 0.5 MG PO TABS: ORAL_TABLET | ORAL | 2 refills | Status: DC

## 2016-12-27 NOTE — Progress Notes (Addendum)
BH MD/PA/NP OP Progress Note   Cheryl [name] 12/27/2016 10:47 AM MRN:  [id number] Subjective: "I'm frustrated" HPI: Pt returns for scheduled FU for her Bipolar I ;PTSD and GAD. She reports continued improvement with medications but continues to find daughter a source of  anxiety and stress in her life. Says her daughter told her the other day "You're the reason I use drugs" and blames her for everything that is wrong in her life.  Reports she got her  daughter into OP Suboxone program 2-3 weeks ago but is concerned she wont succeed as she continues to use other drugs.She has not heard of AlAnon or NarAnon.She continues in counseling every 2 weeks and says [name] "helps me a lot". Otherwise her moods are stable without elation/depression. Her boyfriend is back in her life again and this relationship has stabilized.  NO C/O POLYURIA;POLYPHAGIA;POLYDIPSIA.Diabetes continues to be controlled.   Chief Complaint    Follow-up; Stress; Trauma; Manic Behavior; Family Problem     Visit Diagnosis:           ICD-9-CM ICD-10-CM                   1.   Bipolar 1 disorder ([place]) 296.7 F31.9       2.   PTSD (post-traumatic stress disorder) 309.81 F43.10       3.   Chronic anxiety 300.00 F41.9       4.   Panic disorder 300.01 F41.0       5.   Chronic pain syndrome 338.4 G89.4       6.   Insulin dependent type 2 diabetes mellitus, controlled ([place]) 250.00... E11.9...       7.   Alcoholism and drug addiction in family V61.41 Z63.72       8.   Dysfunctional family processes V61.9 Z63.9                Past Medical History:  Past Medical History:  Diagnosis Date  . Anxiety   . Arthritis    back, hands  . Bipolar affective ([place])   . Blood transfusion   . Depression   . Diabetes mellitus   . Headache(784.0)   . High blood pressure   . High cholesterol   . History of DVT of lower extremity    left, after pregnancy  . Hypothyroidism   . Pneumonia    11/2011  . Recurrent upper respiratory infection  (URI)   . Schizophrenia ([place])   . Shortness of breath    with exertion  . Suicidal ideation   . Thyroid disease     Past Surgical History:  Procedure Laterality Date  . APPENDECTOMY    . ESOPHAGOGASTRODUODENOSCOPY  11/23/2011   Procedure: ESOPHAGOGASTRODUODENOSCOPY (EGD);  Surgeon: Landry Dyke, MD;  Location: [facility];  Service: Endoscopy;  Laterality: N/A;  . TUBAL LIGATION     Family History:  Family History  Problem Relation Age of Onset  . Diabetes Mother   . Lung cancer Father   . Heart failure Father    Social History:  Social History   Social History  . Marital status: Divorced    Spouse name: N/A  . Number of children: N/A  . Years of education: N/A   Social History Main Topics  . Smoking status: Never Smoker  . Smokeless tobacco: Never Used  . Alcohol use No  . Drug use: No  . Sexual activity: Yes   Other Topics Concern  . None  Social History Narrative  . None   Additional History: NCCSRS negative   Psychiatric Specialty Exam:   Review of Systems Gastrointestinal:  GERD hx  Musculoskeletal: Chronic back pain on pain meds.HX OFTension HAs cant take Tylenol due to Liver per her GI   Neurological: Positive for tremors no change. Negative for dizziness, tingling, sensory change, speech change, focal weakness, seizures, loss of consciousness and weakness.Negative for falls.  Endo/Heme/Allergies:No Polydipsia,polyphagia;polyuria: On Insulin and metformin for Diabetes.Continues to manage well. A1C at goal by hx.AM sugar 124 Psychiatric/Behavioral: Negative for depression, suicidal ideas, hallucinations, memory loss and substance abuse. The patient happy with curent meds  The patient does not have insomnia.  Anxiety related to daughter /daughter's addiction per HPI   Blood pressure 120/80, pulse 85, resp. rate 16, height 5\' 6"  (1.676 m), weight 200 lb (90.7 kg), SpO2 95 %.Body mass index is 32.28 kg/m.  General Appearance:  Neat;smiling;Attractive; Dressed up and made up-new boyfriend back inher life   Eye Contact:  Good  Speech:  Clear and Coherent  Volume:  Normal  Mood: Basically euthymic with c/o anxiety per HPI  Affect:  Appropriate  Thought Process:  Coherent and Descriptions of Associations: Intact  Orientation:  Full (Time, Place, and Person)  Thought Content:  WDL and Anxious obsessions  Suicidal Thoughts:  No  Homicidal Thoughts:  No  Memory:  Negative  Judgement:  Other:  Limited  Insight:  Lacking needs continued counseling-RECOMMENDED ALaNON/NARaNON  Psychomotor Activity:  WNL  Concentration:  Good  Recall:  Good  Fund of Knowledge: Fair  Language: Fair  Akathisia:  NA  Handed:  Right  AIMS (if indicated):  NA  Assets:  Desire for Improvement Financial Resources/Insurance Housing Social Support Transportation   ADL's:  Intact  Cognition: WNL  Sleep:  Normal  Musculoskeletal: Strength & Muscle Tone: within normal limits Gait & Station: normal Patient leans: N/A    Current Outpatient Prescriptions  Medication Sig Dispense Refill  . acyclovir (ZOVIRAX) 400 MG tablet     . albuterol (PROVENTIL HFA;VENTOLIN HFA) 108 (90 BASE) MCG/ACT inhaler Inhale 2 puffs into the lungs every 6 (six) hours as needed for wheezing.    Marland Kitchen amitriptyline (ELAVIL) 10 MG tablet     . atenolol (TENORMIN) 100 MG tablet     . atenolol (TENORMIN) 25 MG tablet     . buPROPion (WELLBUTRIN SR) 100 MG 12 hr tablet Take 1 tablet (100 mg total) by mouth 2 (two) times daily. 180 tablet 0  . clonazePAM (KLONOPIN) 0.5 MG tablet TID and HS 120 tablet 2  . dicyclomine (BENTYL) 20 MG tablet Take 20 mg by mouth every morning.    . docusate sodium (COLACE) 100 MG capsule Take 100 mg by mouth 2 (two) times daily.     . fluticasone (FLONASE) 50 MCG/ACT nasal spray Place 2 sprays into the nose daily as needed for allergies.    Marland Kitchen gabapentin (NEURONTIN) 600 MG tablet Take 600 mg by mouth 4 (four) times daily.     . insulin  NPH-insulin regular (NOVOLIN 70/30) (70-30) 100 UNIT/ML injection Inject 20-26 Units into the skin 2 (two) times daily. He takes 26 units at 7am and 20 units at 7pm.    . levothyroxine (SYNTHROID, LEVOTHROID) 150 MCG tablet Take 150 mcg by mouth daily before breakfast.     . lidocaine (LIDODERM) 5 % Place 1 patch onto the skin daily as needed (For pain.). Remove & Discard patch within 12 hours or as directed by MD    .  lisinopril (PRINIVIL,ZESTRIL) 10 MG tablet     . lovastatin (MEVACOR) 20 MG tablet Take 20 mg by mouth every morning.    . meloxicam (MOBIC) 7.5 MG tablet     . metFORMIN (GLUCOPHAGE) 1000 MG tablet     . Multiple Vitamin (MULITIVITAMIN WITH MINERALS) TABS Take 1 tablet by mouth daily with lunch.     . OLANZapine (ZYPREXA) 20 MG tablet Take 1 tablet (20 mg total) by mouth at bedtime. 30 tablet 2  . polyethylene glycol (MIRALAX / GLYCOLAX) packet Take 17-34 g by mouth every morning.     . propranolol (INDERAL) 10 MG tablet Take as needed for panic 2x/day 60 tablet 2  . HYDROcodone-acetaminophen (NORCO) 10-325 MG tablet     . ketorolac (ACULAR) 0.5 % ophthalmic solution      No current facility-administered medications for this visit.     Medical Decision Making:  Established Problem, Stable/Improving (1), Review of Last Therapy Session (1) and Review of Medication Regimen & Side Effects (2)  Treatment Plan Summary:  PTSD Continue Wellbutrin/Klonopin-continue counseling especially with anxieties around Doctor visits  Panic Propranolol 10 mg bid prn  Grief/Codependency/PTSD  Continue counseling  Chronic Pain Per Dr Sheryle Hail  Diabetes Continue FU with Diabetes MD/continue to follow in [place] family process due to daughter's chemical dependency Counseling Attend AlAnon/NarAnon Counseled regarding time factor in treating her daughter's addicted brain and the psychology addicts use on other to justify and continue to use inresponse to brain disorder  /associated cravings Suggested that she not allow her daughter to "live in her head rent free" and perhaps try technique of "Talk to the hand" when she starts blaming Mom  FU 3 months-sooner if need  This was a 15 minute face to face visit  [name] PA 12/30/2016, 2:14 PM

## 2017-02-14 ENCOUNTER — Other Ambulatory Visit: Payer: Self-pay | Admitting: Internal Medicine

## 2017-02-14 DIAGNOSIS — Z1231 Encounter for screening mammogram for malignant neoplasm of breast: Secondary | ICD-10-CM

## 2017-02-26 ENCOUNTER — Ambulatory Visit (INDEPENDENT_AMBULATORY_CARE_PROVIDER_SITE_OTHER): Payer: Medicare Other | Admitting: Licensed Clinical Social Worker

## 2017-02-26 ENCOUNTER — Ambulatory Visit: Payer: Self-pay

## 2017-02-26 DIAGNOSIS — F319 Bipolar disorder, unspecified: Secondary | ICD-10-CM | POA: Diagnosis not present

## 2017-02-26 DIAGNOSIS — F4310 Post-traumatic stress disorder, unspecified: Secondary | ICD-10-CM

## 2017-02-26 DIAGNOSIS — Z639 Problem related to primary support group, unspecified: Secondary | ICD-10-CM | POA: Diagnosis not present

## 2017-02-26 DIAGNOSIS — F419 Anxiety disorder, unspecified: Secondary | ICD-10-CM

## 2017-02-26 DIAGNOSIS — F410 Panic disorder [episodic paroxysmal anxiety] without agoraphobia: Secondary | ICD-10-CM | POA: Diagnosis not present
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
## 2017-03-14 ENCOUNTER — Ambulatory Visit (INDEPENDENT_AMBULATORY_CARE_PROVIDER_SITE_OTHER): Payer: Medicare Other | Admitting: Licensed Clinical Social Worker

## 2017-03-14 DIAGNOSIS — F410 Panic disorder [episodic paroxysmal anxiety] without agoraphobia: Secondary | ICD-10-CM

## 2017-03-14 DIAGNOSIS — F4310 Post-traumatic stress disorder, unspecified: Secondary | ICD-10-CM | POA: Diagnosis not present

## 2017-03-14 DIAGNOSIS — F419 Anxiety disorder, unspecified: Secondary | ICD-10-CM

## 2017-03-14 DIAGNOSIS — F319 Bipolar disorder, unspecified: Secondary | ICD-10-CM | POA: Diagnosis not present

## 2017-03-14 NOTE — Progress Notes (Signed)
   THERAPIST PROGRESS NOTE  Session Time: 10:01 AM to 10:53 AM  Participation Level: Active  Behavioral Response: CasualAlertEuthymic  Type of Therapy: Individual Therapy  Treatment Goals addressed:  learn stress management skills, decrease anxiety and panic  Interventions: Motivational Interviewing, Solution Focused, Strength-based, Supportive and Other: Trauma focused interventions, interpersonal relationships  Summary: Cheryl [name] is a 61 y.o. female who presents with back and leg hurting. Sending to a back doctor, MRI of back and leg. Relates problems with sleeping related to physical issues and also shared current worries. Take something at night to shut mind down. Discussed strategies to help with sleep and relates used to write worry list but difficult to do with vision problems. Discussed stressors related to worries about granddaughter. Described feeling irritable all month. Worries with daughter and her medical issues. Described frustrations in relationship related to his not setting boundaries with his kids and grand kids. She is setting boundaries in this relationship. Reviewed pros and cons of relationship and will not stay with him if he does not set better boundaries. Reviewed that anger is core emotion for her related to past trauma. It is a survival thing and sees that as something that protects her. If she backs down and lays down people will walk all over her. It may have impacted her life but that she needs it. Relates if she did not protect herself it would be difficult and in her life there are issues surrounded with people so many problems, so setting boundaries helps her manage not taking on their problems. Shared about 1 good relationship she's had in her life but ended this one as well and relates better communication with it would have helped. Relates that underlying anger comes from  2 breakdowns, has been in several mental hospitals. Sees that anger can interfere in  relationships but also all sees in as a strength she gained from her trauma. In coping with her emotions she describes distraction and TV as helpful.    Suicidal/Homicidal: No  Therapist Response: Reviewed symptoms and identified anger core emotion for her related to trauma. Related ways that has negatively impacted her and she identifies that interferes with relationship but also realizes that it is a necessary thing, and source of resilience and strength related to trauma. Therapist pointed out that recognizing strength and resilience from trauma identifies as her in healing process. Identified setting boundaries in her relationships is helpful functioning so she does not take on other people's problems and protects herself. Help patient to process feelings around stressors, to help in processing anger and worry, helped in stress management strategies and problem solving and validated patient on her emotions. Identified distraction is helpful in regulating emotions. Utilized motivational strategies and reviewing pros and cons of relationship and helping in decision making around relationship. Provided strength based and supportive emotions.   Plan: Return again in 2 weeks.2. Patient gain insight and implement effective coping strategies for her stressors.3. Therapist provided trauma focused intervention and helpful strategies for emotional regulation  Diagnosis: Axis I:  Bipolar I disorder, Panic disorder, chronic anxiety, PTSD    Axis II: No diagnosis    Cordella Register, LCSW 03/14/2017

## 2017-03-28 ENCOUNTER — Encounter (HOSPITAL_COMMUNITY): Payer: Self-pay | Admitting: Medical

## 2017-03-28 ENCOUNTER — Ambulatory Visit (INDEPENDENT_AMBULATORY_CARE_PROVIDER_SITE_OTHER): Payer: Medicare Other | Admitting: Medical

## 2017-03-28 VITALS — BP 122/74 | HR 74 | Resp 16 | Ht 66.0 in | Wt 203.0 lb

## 2017-03-28 DIAGNOSIS — E119 Type 2 diabetes mellitus without complications: Secondary | ICD-10-CM

## 2017-03-28 DIAGNOSIS — Z6372 Alcoholism and drug addiction in family: Secondary | ICD-10-CM

## 2017-03-28 DIAGNOSIS — Z794 Long term (current) use of insulin: Secondary | ICD-10-CM | POA: Diagnosis not present

## 2017-03-28 DIAGNOSIS — F319 Bipolar disorder, unspecified: Secondary | ICD-10-CM | POA: Diagnosis not present

## 2017-03-28 DIAGNOSIS — G894 Chronic pain syndrome: Secondary | ICD-10-CM

## 2017-03-28 DIAGNOSIS — F4310 Post-traumatic stress disorder, unspecified: Secondary | ICD-10-CM

## 2017-03-28 DIAGNOSIS — F419 Anxiety disorder, unspecified: Secondary | ICD-10-CM

## 2017-03-28 DIAGNOSIS — F410 Panic disorder [episodic paroxysmal anxiety] without agoraphobia: Secondary | ICD-10-CM | POA: Diagnosis not present

## 2017-03-28 DIAGNOSIS — Z639 Problem related to primary support group, unspecified: Secondary | ICD-10-CM | POA: Diagnosis not present

## 2017-03-28 MED ORDER — OLANZAPINE 20 MG PO TABS: 20.0000 mg | ORAL_TABLET | Freq: Every day | ORAL | 2 refills | Status: DC

## 2017-03-28 MED ORDER — CLONAZEPAM 0.5 MG PO TABS: ORAL_TABLET | ORAL | 2 refills | Status: DC

## 2017-03-28 MED ORDER — PROPRANOLOL HCL 10 MG PO TABS: ORAL_TABLET | ORAL | 2 refills | Status: DC

## 2017-03-28 MED ORDER — BUPROPION HCL ER (SR) 100 MG PO TB12: 100.0000 mg | ORAL_TABLET | Freq: Two times a day (BID) | ORAL | 0 refills | Status: DC

## 2017-03-28 MED ORDER — PREGABALIN 150 MG PO CAPS: 150.0000 mg | ORAL_CAPSULE | Freq: Three times a day (TID) | ORAL | 2 refills | Status: DC

## 2017-03-28 NOTE — Progress Notes (Signed)
BH MD/PA/NP OP Progress Note  03/28/2017 11:05 AM Cheryl [name]  MRN:  [id number]  Subjective: "I'm stressed Medication could be a little stronger (KLONOPIN)" HPI:  At Last Visit: Pt returned for FU for her Bipolar I ;PTSD and GAD. She reported continued improvement with medications Still finds daughter stressful and boyfriend is now an "ex" after sending her belongings to her by his Granddaughters after she chose to go home for a night. She feels picked and pulled on by a daughetr she is forced to live with due to economics and a boyfriend who asks why she hasnt called after he send her things back to her. She continues in counseling every 2 weeks and says [name] "helps me a lot" . NO C/O POLYURIA;POLYPHAGIA;POLYDIPSIA.  TODAY she continues c/o stressful daughter relationships.She continues with her counseling. She has developed moderaely severe LBP with LT LE radiculopathy that awakens her at 2AM and her PCP  Is arranging an MRI. She has some swelling of her Lt foot associated. She says he has gained 3 lbs from stress eating  Chief Complaint    Follow-up; Bipolar DO; Trauma; Stress; Anxiety; Diabetes Mellitus     Visit Diagnosis:    P   ICD-9-CM ICD-10-CM   PL                1.   Bipolar 1 disorder (HCC) 296.7 F31.9       2.   PTSD (post-traumatic stress disorder) 309.81 F43.10       3.   Chronic anxiety 300.00 F41.9       4.   Panic disorder 300.01 F41.0       5.   Insulin dependent type 2 diabetes mellitus, controlled ([place]) 250.00... E11.9.Marland KitchenMarland Kitchen             THERAPIST PROGRESS NOTE  Session Time: 10:01 AM to 10:53 AM  Participation Level: Active  Behavioral Response: CasualAlertEuthymic  Type of Therapy: Individual Therapy  Treatment Goals addressed:  learn stress management skills, decrease anxiety and panic  Interventions: Motivational Interviewing, Solution Focused, Strength-based, Supportive and Other: Trauma focused interventions, interpersonal  relationships  Summary: Cheryl [name] is a 61 y.o. female who presents with back and leg hurting. Sending to a back doctor, MRI of back and leg. Relates problems with sleeping related to physical issues and also shared current worries. Take something at night to shut mind down. Discussed strategies to help with sleep and relates used to write worry list but difficult to do with vision problems. Discussed stressors related to worries about granddaughter. Described feeling irritable all month. Worries with daughter and her medical issues. Described frustrations in relationship related to his not setting boundaries with his kids and grand kids. She is setting boundaries in this relationship. Reviewed pros and cons of relationship and will not stay with him if he does not set better boundaries. Reviewed that anger is core emotion for her related to past trauma. It is a survival thing and sees that as something that protects her. If she backs down and lays down people will walk all over her. It may have impacted her life but that she needs it. Relates if she did not protect herself it would be difficult and in her life there are issues surrounded with people so many problems, so setting boundaries helps her manage not taking on their problems. Shared about 1 good relationship she's had in her life but ended this one as well and relates better communication with it  would have helped. Relates that underlying anger comes from  2 breakdowns, has been in several mental hospitals. Sees that anger can interfere in relationships but also all sees in as a strength she gained from her trauma. In coping with her emotions she describes distraction and TV as helpful.    Suicidal/Homicidal: No  Therapist Response: Reviewed symptoms and identified anger core emotion for her related to trauma. Related ways that has negatively impacted her and she identifies that interferes with relationship but also realizes that it is a  necessary thing, and source of resilience and strength related to trauma. Therapist pointed out that recognizing strength and resilience from trauma identifies as her in healing process. Identified setting boundaries in her relationships is helpful functioning so she does not take on other people's problems and protects herself. Help patient to process feelings around stressors, to help in processing anger and worry, helped in stress management strategies and problem solving and validated patient on her emotions. Identified distraction is helpful in regulating emotions. Utilized motivational strategies and reviewing pros and cons of relationship and helping in decision making around relationship. Provided strength based and supportive emotions.   Plan: Return again in 2 weeks.2. Patient gain insight and implement effective coping strategies for her stressors.3. Therapist provided trauma focused intervention and helpful strategies for emotional regulation  Diagnosis:      Axis I:  Bipolar I disorder, Panic disorder, chronic anxiety, PTSD                          Axis II: No diagnosis Cordella Register, LCSW 03/14/2017       Past Medical History:  Past Medical History:  Diagnosis Date  . Anxiety   . Arthritis    back, hands  . Bipolar affective ([place])   . Blood transfusion   . Depression   . Diabetes mellitus   . Headache(784.0)   . High blood pressure   . High cholesterol   . History of DVT of lower extremity    left, after pregnancy  . Hypothyroidism   . Pneumonia    11/2011  . Recurrent upper respiratory infection (URI)   . Schizophrenia ([place])   . Shortness of breath    with exertion  . Suicidal ideation   . Thyroid disease     Past Surgical History:  Procedure Laterality Date  . APPENDECTOMY    . ESOPHAGOGASTRODUODENOSCOPY  11/23/2011   Procedure: ESOPHAGOGASTRODUODENOSCOPY (EGD);  Surgeon: Landry Dyke, MD;  Location: [facility];  Service: Endoscopy;  Laterality: N/A;  .  TUBAL LIGATION     Family History:  Family History  Problem Relation Age of Onset  . Diabetes Mother   . Lung cancer Father   . Heart failure Father    Social History:  Social History   Social History  . Marital status: Divorced    Spouse name: N/A  . Number of children: N/A  . Years of education: N/A   Social History Main Topics  . Smoking status: Never Smoker  . Smokeless tobacco: Never Used  . Alcohol use No  . Drug use: No  . Sexual activity: Yes   Other Topics Concern  . None   Social History Narrative  . None   Additional History: NCCSRS negative  THERAPIST PROGRESS NOTE  Session Time: 10:01 AM to 10:53 AM Participation Level: Active Behavioral Response: CasualAlertEuthymic Type of Therapy: Individual Therapy Treatment Goals addressed:  learn stress management skills, decrease anxiety  and panic  Interventions: Motivational Interviewing, Solution Focused, Strength-based, Supportive and Other: Trauma focused interventions, interpersonal relationships Summary: Cheryl [name] is a 61 y.o. female who presents with back and leg hurting. Sending to a back doctor, MRI of back and leg. Relates problems with sleeping related to physical issues and also shared current worries. Take something at night to shut mind down. Discussed strategies to help with sleep and relates used to write worry list but difficult to do with vision problems. Discussed stressors related to worries about granddaughter. Described feeling irritable all month. Worries with daughter and her medical issues. Described frustrations in relationship related to his not setting boundaries with his kids and grand kids. She is setting boundaries in this relationship. Reviewed pros and cons of relationship and will not stay with him if he does not set better boundaries. Reviewed that anger is core emotion for her related to past trauma. It is a survival thing and sees that as something that protects her. If she backs  down and lays down people will walk all over her. It may have impacted her life but that she needs it. Relates if she did not protect herself it would be difficult and in her life there are issues surrounded with people so many problems, so setting boundaries helps her manage not taking on their problems. Shared about 1 good relationship she's had in her life but ended this one as well and relates better communication with it would have helped. Relates that underlying anger comes from  2 breakdowns, has been in several mental hospitals. Sees that anger can interfere in relationships but also all sees in as a strength she gained from her trauma. In coping with her emotions she describes distraction and TV as helpful.   Suicidal/Homicidal: No Therapist Response: Reviewed symptoms and identified anger core emotion for her related to trauma. Related ways that has negatively impacted her and she identifies that interferes with relationship but also realizes that it is a necessary thing, and source of resilience and strength related to trauma. Therapist pointed out that recognizing strength and resilience from trauma identifies as her in healing process. Identified setting boundaries in her relationships is helpful functioning so she does not take on other people's problems and protects herself. Help patient to process feelings around stressors, to help in processing anger and worry, helped in stress management strategies and problem solving and validated patient on her emotions. Identified distraction is helpful in regulating emotions. Utilized motivational strategies and reviewing pros and cons of relationship and helping in decision making around relationship. Provided strength based and supportive emotions.  Plan: Return again in 2 weeks.2. Patient gain insight and implement effective coping strategies for her stressors.3. Therapist provided trauma focused intervention and helpful strategies for emotional  regulation Diagnosis:      Axis I:  Bipolar I disorder, Panic disorder, chronic anxiety, PTS                         Axis II: No diagnosis Cordella Register, LCSW 03/14/2017 Psychiatric Specialty Exam:   Review of Systems General:c/o chronic family stress (daughters)/dysfunction.Gained 3 lbs Gastrointestinal:  GERD hx  Musculoskeletal:  Recurrent  COMPLAINT of back pain TODAY.MRI being scheduled  Neurological:.HX OFTension HAs cant take Tylenol due to Liver per her GI no c/o today Negative for falls.   Positive for tremors no change. Negative for dizziness, tingling, sensory change, speech change, focal weakness, seizures, loss of consciousness and weakness.  Endo/Heme/Allergies:No Polydipsia,polyphagia;polyuria: On Insulin and metformin for Diabetes.Continues to manage well. A1C at goal by hx.AM sugar 124 Psychiatric/Behavioral: Negative for depression, suicidal ideas, hallucinations, memory loss and substance abuse. The patient happy with curent meds except wanting more Klonopin  The patient does not have insomnia.     Blood pressure 122/74, pulse 74, resp. rate 16, height 5\' 6"  (1.676 m), weight 203 lb (92.1 kg), SpO2 96 %.Body mass index is 32.77 kg/m.  General Appearance: Neat;smiling;Dressed up and made up-new boyfriend still in k her life   Eye Contact:  Good  Speech:  Clear and Coherent  Volume:  Normal  Mood:  Mixed-euthymic;dysphoric  Affect:  Congruent  Thought Process:  Coherent and Descriptions of Associations: Intact  Orientation:  Full (Time, Place, and Person)  Thought Content:  WDL and Anxious obsessions  Suicidal Thoughts:  No  Homicidal Thoughts:  No  Memory:  Negative  Judgement:  Fair  Insight:  Lacking needs counseling  Psychomotor Activity:  WNL  Concentration:  Good  Recall:  Good  Fund of Knowledge: Fair  Language: Fair  Akathisia:  NA  Handed:  Right  AIMS (if indicated):  NA  Assets:  Desire for Improvement Financial Resources/Insurance Housing Social  Support Transportation  ADL's:  Intact  Cognition: WNL  Sleep:  Normal  Musculoskeletal: Strength & Muscle Tone: within normal limits Gait & Station: normal Patient leans: N/A    Current Outpatient Prescriptions  Medication Sig Dispense Refill  . acyclovir (ZOVIRAX) 400 MG tablet     . albuterol (PROVENTIL HFA;VENTOLIN HFA) 108 (90 BASE) MCG/ACT inhaler Inhale 2 puffs into the lungs every 6 (six) hours as needed for wheezing.    Marland Kitchen amitriptyline (ELAVIL) 10 MG tablet     . atenolol (TENORMIN) 100 MG tablet     . atenolol (TENORMIN) 25 MG tablet     . buPROPion (WELLBUTRIN SR) 100 MG 12 hr tablet Take 1 tablet (100 mg total) by mouth 2 (two) times daily. 180 tablet 0  . clonazePAM (KLONOPIN) 0.5 MG tablet TID and HS 120 tablet 2  . dicyclomine (BENTYL) 20 MG tablet Take 20 mg by mouth every morning.    . docusate sodium (COLACE) 100 MG capsule Take 100 mg by mouth 2 (two) times daily.     . fluticasone (FLONASE) 50 MCG/ACT nasal spray Place 2 sprays into the nose daily as needed for allergies.    Marland Kitchen HYDROcodone-acetaminophen (NORCO) 10-325 MG tablet     . insulin NPH-insulin regular (NOVOLIN 70/30) (70-30) 100 UNIT/ML injection Inject 20-26 Units into the skin 2 (two) times daily. He takes 26 units at 7am and 20 units at 7pm.    . ketorolac (ACULAR) 0.5 % ophthalmic solution     . levothyroxine (SYNTHROID, LEVOTHROID) 150 MCG tablet Take 150 mcg by mouth daily before breakfast.     . lidocaine (LIDODERM) 5 % Place 1 patch onto the skin daily as needed (For pain.). Remove & Discard patch within 12 hours or as directed by MD    . lisinopril (PRINIVIL,ZESTRIL) 10 MG tablet     . lovastatin (MEVACOR) 20 MG tablet Take 20 mg by mouth every morning.    . meloxicam (MOBIC) 7.5 MG tablet     . metFORMIN (GLUCOPHAGE) 1000 MG tablet     . Multiple Vitamin (MULITIVITAMIN WITH MINERALS) TABS Take 1 tablet by mouth daily with lunch.     . OLANZapine (ZYPREXA) 20 MG tablet Take 1 tablet (20 mg  total) by mouth at  bedtime. 30 tablet 2  . polyethylene glycol (MIRALAX / GLYCOLAX) packet Take 17-34 g by mouth every morning.     . propranolol (INDERAL) 10 MG tablet Take as needed for panic 2x/day 60 tablet 2  . pregabalin (LYRICA) 150 MG capsule Take 1 capsule (150 mg total) by mouth 3 (three) times daily. 90 capsule 2   No current facility-administered medications for this visit.     Medical Decision Making:  Established Problem, Stable/Improving (1), Review of Last Therapy Session (1) and Review of Medication Regimen & Side Effects (2)  Treatment Plan Summary:  PTSD/Anxiety Continue Wellbutrin/Klonopin (NO INCREASE)/Lyrica (D/C Neurontin)-continue counseling especially with anxieties around Doctor visits  Panic Propranolol 10 mg bid prn  Grief/Codependency/PTSD  Continue counseling  Chronic Pain/New LBP Per Dr Sheryle Hail  Diabetes Continue FU with Diabetes MD/continue to follow in Care Everywhere  Relationship stress/living situation Contact [facility] Print out f given Counseling  FU 3 months  This was a 15 minute face to face visit  [name] PA 03/28/2017, 11:05 AM

## 2017-04-01 ENCOUNTER — Ambulatory Visit (INDEPENDENT_AMBULATORY_CARE_PROVIDER_SITE_OTHER): Payer: Medicare Other | Admitting: Licensed Clinical Social Worker

## 2017-04-01 DIAGNOSIS — F410 Panic disorder [episodic paroxysmal anxiety] without agoraphobia: Secondary | ICD-10-CM | POA: Diagnosis not present

## 2017-04-01 DIAGNOSIS — F419 Anxiety disorder, unspecified: Secondary | ICD-10-CM

## 2017-04-01 DIAGNOSIS — F4310 Post-traumatic stress disorder, unspecified: Secondary | ICD-10-CM | POA: Diagnosis not present

## 2017-04-01 DIAGNOSIS — F319 Bipolar disorder, unspecified: Secondary | ICD-10-CM

## 2017-04-01 NOTE — Progress Notes (Signed)
   THERAPIST PROGRESS NOTE  Session Time: 9 AM to 9:52 AM  Participation Level: Active  Behavioral Response: CasualAlertEuthymic  Type of Therapy: Individual Therapy  Treatment Goals addressed:  learn stress management skills, decrease anxiety and panic  Interventions: Solution Focused, Strength-based, Supportive, Family Systems and Other: Stress management  Summary: Cheryl [name] is a 61 y.o. female who presents with review of symptoms and relates that she needs to get anger out  about daughter's boyfriend. Discussed with therapist willingness to set boundaries for herself when somebody crosses the line and takes advantage to protect her own well being. Discussed other stressors in her life that become overwhelming including her daughter's addictive behavior, stressors and sees the connection to stress and physical symptoms so the need to work on stress management. Describes her stress as not knowing what's can happen day today. Shared giving advice to granddaughter of taking care of herself and therapist redirected to patient to take care of herself as priority. Relates getting angry and takes it out on boyfriend, that he is able to supportive and that she needs to let it out. Describe relationship with daughters is significant source of stress as well as her physical issues. Continue to work on Agricultural consultant of helping her daughter who is wrapped up in her addiction. A shunt describes being overwhelmed to the point of not being able to sleep for 2 nights. Therapist encouraged patient to look at positive developments of support from boyfriend and her taking a stand to mood negative people in her life.  Suicidal/Homicidal: No  Therapist Response: Reviewed progress and symptoms and continue to help patient work on stress management strategies including problem solving due to to such stressors as her daughter. Reinforced patient's recognition of positive coping for herself including the need to  vent and discussed emotional regulation strategies include identifying and releasing feelings to manage emotions. Continue to apply motivational interviewing strategies to review pros and cons of helping her daughter financially when she is still in addiction and how this can further her addiction. Provided positive feedback for patient in establishing boundaries to help her in taking care of self as well as being assertive as healthy in interpersonal relationships. Continue to explore with her where it is healthy for her to set her boundaries. Identified positive developments to help her in stress management including supportive boyfriend and setting boundaries with daughter's boyfriend. Explored cognitive strategies to help her manage anger effectively and utilized motivational strategies to utilize helpful coping as it is helpful for mood and physical symptoms. Discussed cognitive strategies to help her with emotions including recognizing only so much she control and in investing so much of her emotions is not worth it for her well-being. Provided strength based and supportive interventions  Plan: Return again in 2 weeks.2. Therapist work with patient on stress management and emotional regulation skills  Diagnosis: Axis I:  Bipolar I disorder, Panic disorder, chronic anxiety, PTSD    Axis II: No diagnosis    Cordella Register, LCSW 04/01/2017

## 2017-04-22 ENCOUNTER — Ambulatory Visit (INDEPENDENT_AMBULATORY_CARE_PROVIDER_SITE_OTHER): Payer: Medicare Other | Admitting: Licensed Clinical Social Worker

## 2017-04-22 DIAGNOSIS — F4310 Post-traumatic stress disorder, unspecified: Secondary | ICD-10-CM | POA: Diagnosis not present

## 2017-04-22 DIAGNOSIS — F419 Anxiety disorder, unspecified: Secondary | ICD-10-CM | POA: Diagnosis not present

## 2017-04-22 DIAGNOSIS — F319 Bipolar disorder, unspecified: Secondary | ICD-10-CM | POA: Diagnosis not present

## 2017-04-22 DIAGNOSIS — F410 Panic disorder [episodic paroxysmal anxiety] without agoraphobia: Secondary | ICD-10-CM
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
## 2017-05-08 ENCOUNTER — Ambulatory Visit (HOSPITAL_COMMUNITY): Payer: Self-pay | Admitting: Licensed Clinical Social Worker

## 2017-05-09 ENCOUNTER — Ambulatory Visit (HOSPITAL_COMMUNITY): Payer: Medicare Other | Admitting: Licensed Clinical Social Worker

## 2017-05-09 ENCOUNTER — Encounter (HOSPITAL_COMMUNITY): Payer: Self-pay

## 2017-05-23 ENCOUNTER — Ambulatory Visit (INDEPENDENT_AMBULATORY_CARE_PROVIDER_SITE_OTHER): Payer: Medicare Other | Admitting: Licensed Clinical Social Worker

## 2017-05-23 DIAGNOSIS — F4310 Post-traumatic stress disorder, unspecified: Secondary | ICD-10-CM

## 2017-05-23 DIAGNOSIS — F419 Anxiety disorder, unspecified: Secondary | ICD-10-CM | POA: Diagnosis not present

## 2017-05-23 DIAGNOSIS — F319 Bipolar disorder, unspecified: Secondary | ICD-10-CM | POA: Diagnosis not present

## 2017-05-23 DIAGNOSIS — F410 Panic disorder [episodic paroxysmal anxiety] without agoraphobia: Secondary | ICD-10-CM
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
## 2017-06-06 ENCOUNTER — Ambulatory Visit (INDEPENDENT_AMBULATORY_CARE_PROVIDER_SITE_OTHER): Payer: Medicare Other | Admitting: Licensed Clinical Social Worker

## 2017-06-06 DIAGNOSIS — F410 Panic disorder [episodic paroxysmal anxiety] without agoraphobia: Secondary | ICD-10-CM

## 2017-06-06 DIAGNOSIS — F319 Bipolar disorder, unspecified: Secondary | ICD-10-CM

## 2017-06-06 DIAGNOSIS — F419 Anxiety disorder, unspecified: Secondary | ICD-10-CM | POA: Diagnosis not present

## 2017-06-06 DIAGNOSIS — F4310 Post-traumatic stress disorder, unspecified: Secondary | ICD-10-CM

## 2017-06-06 NOTE — Progress Notes (Signed)
THERAPIST PROGRESS NOTE  Session Time: 1:01 PM to 1:53 PM  Participation Level: Active  Behavioral Response: CasualAlertEuthymic  Type of Therapy: Individual Therapy  Treatment Goals addressed:   learn stress management skills, decrease anxiety and panic  Interventions: Solution Focused, Strength-based, Supportive and Other: Drug and alcohol counseling, stress management, effective interpersonal skills  Summary: Cheryl [name] is a 61 y.o. female who presents with pretty good days lately and correlates this with not as much yelling and fighting. Shares that she was able to talk to [name], her daughter, about better ways to handle disagreements include walking away and also helped her daughter have better understanding of why she stays away related to hurtful comments and her unpredictable negative behaviors. Shared with her daughter need to be treated better. Relates her daughter is doing better so feels she is really able to listen. Daughter going to stay in [place] for month with a person who she was previously involved with who will be a good influence on her and patient relates she is looking forward for her daughter to get help and get better. Shares she understands what her daughter is going through and recognized that she has been stressed, depressed, lonely. With improvement in her daughter  took a lot off shoulders. Encouraging her daughter to go to church and describes spirituality is helpful and seeing people go through something and able to come on the other side of things and providing strength. Relates as a mother it hurts to see what her daughter is going through and knows what she is going through based on patient has been through. Describes when her daughter is not doing well that she is tense doesn't know if she is coming and going what is going to happen next. Encouraged her daughter to talk with her, that she will work with her and encouraged for her to go to church together and  hopes to keep this talking between them going. Patient relates based on knowing what she is going through she wants to be able to help her daughter the way people helped her. Described having a mental breakdown twice, that she help things and that psychiatry and therapy helped her a lot, describes that she has empathy, knows what it took for her to go through difficulties, she knows what her daughter has to do and wants to pass on her understanding to daughter. Patient described support and care she is getting in her relationship and that she is reciprocating that to him. Reviewed patient's risk of harm and patient reports low risk and that she is going to talk to daughter about removing gun in house    Suicidal/Homicidal: No  Therapist Response: Assessed patient's current functioning and noticed progress related to decrease in stressor related to her daughter that has helped patient with improvement in mood. Provided positive feedback for patient implementing healthy interpersonal skills such as effective communication and support of her daughter and helping to address stressor. Provided strength-based intervention and recognizing patient being able to have empathy and knowledge of coping based on difficult situation she has been through. Provided drug and alcohol education and patient being both supportive and also setting boundaries and also how she has taken a role in helping her daughter take positive steps in her life. Discussed spirituality as effective for coping. Processed patient's feelings around her relationship, provided positive feedback for positive impact it is having on her life and identified her relationship as a support and helpful for her coping including  dealing with her medical issues. Provided supportive interventions. Assess for safety and patient does not feel at risk for harm based on daughter's recent behaviors and being able to talk to daughter. Patient still plans to have talk with  daughter about removing the gun from the house.  Plan: Return again in 2 weeks.2. Patient continue to gain insight and implement strategies to manage stress that includes stress in relationship with daughter that will help improve anxiety and stress.3. Therapist work with patient to gain insight and implement effective emotional regulation skills  Diagnosis: Axis I:  Bipolar I disorder, Panic disorder, chronic anxiety, PTSD    Axis II: No diagnosis    Cordella Register, LCSW 06/06/2017

## 2017-06-10 ENCOUNTER — Other Ambulatory Visit (HOSPITAL_COMMUNITY): Payer: Self-pay | Admitting: *Deleted

## 2017-06-10 NOTE — Telephone Encounter (Signed)
Pt's care is being transferred from [name], PA-C to Dr. De Nurse on 07/04/17. Pt will need a rx for klonopin. Pt will be out of medicine on 06/21/17. Please advise.

## 2017-06-10 NOTE — Telephone Encounter (Signed)
Please call Rx in for 3 month supply -hopefully can see her then and Dr Terance Hart have to do this

## 2017-06-11 MED ORDER — CLONAZEPAM 0.5 MG PO TABS: ORAL_TABLET | ORAL | 2 refills | Status: DC

## 2017-06-11 NOTE — Telephone Encounter (Signed)
Medication refill- pt called office requesting a refill for Klonopin. Per [name], PA-C, refill is authorize for Klonopin 0.5mg , #120 w/ 2 refills. Verbal order was provided to [facility] (pharmacist). Pt has a follow up apt on 07/04/17. Called and informed pt of refill status. Pt verbalizes understanding.

## 2017-06-20 ENCOUNTER — Ambulatory Visit (HOSPITAL_COMMUNITY): Payer: Self-pay | Admitting: Medical

## 2017-06-25 ENCOUNTER — Ambulatory Visit (HOSPITAL_COMMUNITY): Payer: Self-pay | Admitting: Licensed Clinical Social Worker

## 2017-07-04 ENCOUNTER — Encounter (HOSPITAL_COMMUNITY): Payer: Self-pay | Admitting: Psychiatry

## 2017-07-04 ENCOUNTER — Ambulatory Visit (INDEPENDENT_AMBULATORY_CARE_PROVIDER_SITE_OTHER): Payer: Medicare Other | Admitting: Psychiatry

## 2017-07-04 VITALS — HR 74 | Ht 67.0 in | Wt 207.0 lb

## 2017-07-04 DIAGNOSIS — G894 Chronic pain syndrome: Secondary | ICD-10-CM

## 2017-07-04 DIAGNOSIS — Z818 Family history of other mental and behavioral disorders: Secondary | ICD-10-CM | POA: Diagnosis not present

## 2017-07-04 DIAGNOSIS — F419 Anxiety disorder, unspecified: Secondary | ICD-10-CM | POA: Diagnosis not present

## 2017-07-04 DIAGNOSIS — F410 Panic disorder [episodic paroxysmal anxiety] without agoraphobia: Secondary | ICD-10-CM | POA: Diagnosis not present

## 2017-07-04 DIAGNOSIS — Z634 Disappearance and death of family member: Secondary | ICD-10-CM | POA: Diagnosis not present

## 2017-07-04 DIAGNOSIS — F4310 Post-traumatic stress disorder, unspecified: Secondary | ICD-10-CM | POA: Diagnosis not present

## 2017-07-04 DIAGNOSIS — Z91411 Personal history of adult psychological abuse: Secondary | ICD-10-CM

## 2017-07-04 DIAGNOSIS — Z915 Personal history of self-harm: Secondary | ICD-10-CM | POA: Diagnosis not present

## 2017-07-04 DIAGNOSIS — Z91410 Personal history of adult physical and sexual abuse: Secondary | ICD-10-CM

## 2017-07-04 DIAGNOSIS — F319 Bipolar disorder, unspecified: Secondary | ICD-10-CM

## 2017-07-04 DIAGNOSIS — Z639 Problem related to primary support group, unspecified: Secondary | ICD-10-CM | POA: Diagnosis not present

## 2017-07-04 DIAGNOSIS — Z79899 Other long term (current) drug therapy: Secondary | ICD-10-CM

## 2017-07-04 DIAGNOSIS — Z6372 Alcoholism and drug addiction in family: Secondary | ICD-10-CM

## 2017-07-04 MED ORDER — PROPRANOLOL HCL 10 MG PO TABS: ORAL_TABLET | ORAL | 1 refills | Status: DC

## 2017-07-04 MED ORDER — CLONAZEPAM 0.5 MG PO TABS: ORAL_TABLET | ORAL | 1 refills | Status: DC

## 2017-07-04 MED ORDER — BUPROPION HCL ER (SR) 100 MG PO TB12: 100.0000 mg | ORAL_TABLET | Freq: Two times a day (BID) | ORAL | 0 refills | Status: DC

## 2017-07-04 MED ORDER — OLANZAPINE 20 MG PO TABS: 20.0000 mg | ORAL_TABLET | Freq: Every day | ORAL | 2 refills | Status: DC

## 2017-07-04 MED ORDER — PREGABALIN 150 MG PO CAPS: 150.0000 mg | ORAL_CAPSULE | Freq: Two times a day (BID) | ORAL | 1 refills | Status: DC
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
## 2017-07-10 ENCOUNTER — Ambulatory Visit (INDEPENDENT_AMBULATORY_CARE_PROVIDER_SITE_OTHER): Payer: Medicare Other | Admitting: Licensed Clinical Social Worker

## 2017-07-10 DIAGNOSIS — F419 Anxiety disorder, unspecified: Secondary | ICD-10-CM | POA: Diagnosis not present

## 2017-07-10 DIAGNOSIS — F319 Bipolar disorder, unspecified: Secondary | ICD-10-CM | POA: Diagnosis not present

## 2017-07-10 DIAGNOSIS — F410 Panic disorder [episodic paroxysmal anxiety] without agoraphobia: Secondary | ICD-10-CM | POA: Diagnosis not present

## 2017-07-10 DIAGNOSIS — F4310 Post-traumatic stress disorder, unspecified: Secondary | ICD-10-CM
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
## 2017-08-27 ENCOUNTER — Ambulatory Visit (INDEPENDENT_AMBULATORY_CARE_PROVIDER_SITE_OTHER): Payer: Medicare Other | Admitting: Psychiatry

## 2017-08-27 ENCOUNTER — Other Ambulatory Visit: Payer: Self-pay

## 2017-08-27 ENCOUNTER — Encounter (HOSPITAL_COMMUNITY): Payer: Self-pay | Admitting: Psychiatry

## 2017-08-27 VITALS — BP 100/62 | HR 98 | Ht 67.0 in | Wt 213.0 lb

## 2017-08-27 DIAGNOSIS — Z634 Disappearance and death of family member: Secondary | ICD-10-CM | POA: Diagnosis not present

## 2017-08-27 DIAGNOSIS — Z79899 Other long term (current) drug therapy: Secondary | ICD-10-CM | POA: Diagnosis not present

## 2017-08-27 DIAGNOSIS — F410 Panic disorder [episodic paroxysmal anxiety] without agoraphobia: Secondary | ICD-10-CM

## 2017-08-27 DIAGNOSIS — R52 Pain, unspecified: Secondary | ICD-10-CM | POA: Diagnosis not present

## 2017-08-27 DIAGNOSIS — F419 Anxiety disorder, unspecified: Secondary | ICD-10-CM | POA: Diagnosis not present

## 2017-08-27 DIAGNOSIS — F4310 Post-traumatic stress disorder, unspecified: Secondary | ICD-10-CM | POA: Diagnosis not present

## 2017-08-27 DIAGNOSIS — Z91410 Personal history of adult physical and sexual abuse: Secondary | ICD-10-CM | POA: Diagnosis not present

## 2017-08-27 DIAGNOSIS — F319 Bipolar disorder, unspecified: Secondary | ICD-10-CM | POA: Diagnosis not present

## 2017-08-27 MED ORDER — PREGABALIN 150 MG PO CAPS: 150.0000 mg | ORAL_CAPSULE | Freq: Two times a day (BID) | ORAL | 1 refills | Status: DC

## 2017-08-27 MED ORDER — CLONAZEPAM 0.5 MG PO TABS: ORAL_TABLET | ORAL | 1 refills | Status: DC

## 2017-08-27 MED ORDER — BUPROPION HCL ER (SR) 100 MG PO TB12: 100.0000 mg | ORAL_TABLET | Freq: Two times a day (BID) | ORAL | 0 refills | Status: DC

## 2017-08-27 MED ORDER — OLANZAPINE 20 MG PO TABS: 20.0000 mg | ORAL_TABLET | Freq: Every day | ORAL | 2 refills | Status: DC
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
## 2017-10-01 ENCOUNTER — Ambulatory Visit (INDEPENDENT_AMBULATORY_CARE_PROVIDER_SITE_OTHER): Payer: Medicare Other | Admitting: Licensed Clinical Social Worker

## 2017-10-01 DIAGNOSIS — F419 Anxiety disorder, unspecified: Secondary | ICD-10-CM | POA: Diagnosis not present

## 2017-10-01 DIAGNOSIS — F410 Panic disorder [episodic paroxysmal anxiety] without agoraphobia: Secondary | ICD-10-CM

## 2017-10-01 DIAGNOSIS — F319 Bipolar disorder, unspecified: Secondary | ICD-10-CM | POA: Diagnosis not present

## 2017-10-01 DIAGNOSIS — F4310 Post-traumatic stress disorder, unspecified: Secondary | ICD-10-CM | POA: Diagnosis not present

## 2017-10-01 DIAGNOSIS — Z639 Problem related to primary support group, unspecified: Secondary | ICD-10-CM

## 2017-10-01 NOTE — Progress Notes (Addendum)
   THERAPIST PROGRESS NOTE  Session Time: 11:05 AM to 11:57 AM  Participation Level: Active  Behavioral Response: CasualAlertEuthymic  Type of Therapy: Individual Therapy  Treatment Goals addressed:  reduce overall level, frequency and intensity of the anxiety so that daily functioning is not impaired, resulting core conflict that is the source of anxiety, learn and apply emotional regulation strategies  Interventions: Solution Focused, Strength-based, Supportive, Reframing and Other: Stress management, education on drug and alcohol  Summary: Cheryl [name] is a 62 y.o. female who presents with continuing main stressor of worries about her daughter, wakes up at night and during the day with worries about her being alive. Has set limites with her but afraid of not having a place for her to stay she will live on streets. Shares that dealing with her daughter that she can get hyper and mean and keep on until gets what she wants. Patient continues to talk to her when at home to help raise insight of negative impact drugs have on her life. Shares her own relationship is a good support and staying at his place she has a little release for her anger. Daughter can put her in a negative mood. Shares that she thinks that going to support group would make it worse for her, she would worry about others and would push her so that if worries she would be pushed over the edge. Shares that she has set better boundaries than the past, not giving her money not every time she wants it, not carrying money around. Therapist discussed ways for patient to emotionally protect herself. Patient shares she has separated a little with her own life. At her boyfriends it is relaxing, tv takes her mind off it, boyfriend is a positive influence. Relates that she will see eye doctor so she can start reading again. Relates her daughter shares that not happy, bored all the time, and patient recognizes she is in vicious cycle of addiction.  Patient questioned what it would take for [facility] to gain insight about what she is doing and therapist discussed patient planting seeds by talking to her. Discussed with patient how showing love is powerful and patient relates she has told her so she has to go on to give her daughter hope and strength. Completed treatment plan  Suicidal/Homicidal: No  Therapist Response: Assess patient current functioning per report. Continue to help patient with coping around significant stressor of her daughter's addiction. Provided feedback to help patient remain hopeful, that her supportive role, can play a role in  facilitating change. At the same time encouraged and provided positive feedback for patient setting boundaries and also insight of limits to her responsibility and recognizing limits to her ability to change her control other people. Treatment plan and noted progress in patient setting boundaries, developing a life separate from her daughter and positive mood created by trying to continue to support and be a positive influence for daughter. Reviewed other resources that can offer support for patient. Provided strength based and supportive intervention.  Plan: Return again in 2-3 weeks.2. Therapist continued to work with patient on stress management, strategies to reduce anxiety and effective emotional regulation skills  Diagnosis: Axis I: Bipolar I disorder, Panic disorder, chronic anxiety, PTSD    Axis II: No diagnosis    Cordella Register, LCSW 10/01/2017

## 2017-10-21 ENCOUNTER — Ambulatory Visit (HOSPITAL_COMMUNITY): Payer: Self-pay | Admitting: Licensed Clinical Social Worker

## 2017-10-24 ENCOUNTER — Other Ambulatory Visit (HOSPITAL_COMMUNITY): Payer: Self-pay | Admitting: Psychiatry

## 2017-10-28 ENCOUNTER — Telehealth (HOSPITAL_COMMUNITY): Payer: Self-pay | Admitting: Psychiatry

## 2017-10-28 NOTE — Telephone Encounter (Signed)
Pt needs refill on klonopin sent to [facility] pharmacy.  Pt aware De Nurse is out of the office today.

## 2017-10-29 MED ORDER — CLONAZEPAM 0.5 MG PO TABS: ORAL_TABLET | ORAL | 0 refills | Status: DC

## 2017-10-29 NOTE — Telephone Encounter (Signed)
Per Dr. De Nurse a one month supply of Clonazepam 05 mg was called in to pharmacy. Patient notified.

## 2017-10-29 NOTE — Telephone Encounter (Signed)
Can send 

## 2017-11-12 ENCOUNTER — Encounter (HOSPITAL_COMMUNITY): Payer: Self-pay | Admitting: Psychiatry

## 2017-11-12 ENCOUNTER — Ambulatory Visit (INDEPENDENT_AMBULATORY_CARE_PROVIDER_SITE_OTHER): Payer: Medicare Other | Admitting: Psychiatry

## 2017-11-12 ENCOUNTER — Other Ambulatory Visit: Payer: Self-pay

## 2017-11-12 VITALS — BP 132/68 | Ht 67.0 in | Wt 218.0 lb

## 2017-11-12 DIAGNOSIS — Z813 Family history of other psychoactive substance abuse and dependence: Secondary | ICD-10-CM | POA: Diagnosis not present

## 2017-11-12 DIAGNOSIS — F419 Anxiety disorder, unspecified: Secondary | ICD-10-CM

## 2017-11-12 DIAGNOSIS — Z639 Problem related to primary support group, unspecified: Secondary | ICD-10-CM

## 2017-11-12 DIAGNOSIS — R450 Nervousness: Secondary | ICD-10-CM | POA: Diagnosis not present

## 2017-11-12 DIAGNOSIS — Z6372 Alcoholism and drug addiction in family: Secondary | ICD-10-CM | POA: Diagnosis not present

## 2017-11-12 DIAGNOSIS — F4310 Post-traumatic stress disorder, unspecified: Secondary | ICD-10-CM | POA: Diagnosis not present

## 2017-11-12 DIAGNOSIS — Z811 Family history of alcohol abuse and dependence: Secondary | ICD-10-CM

## 2017-11-12 DIAGNOSIS — F410 Panic disorder [episodic paroxysmal anxiety] without agoraphobia: Secondary | ICD-10-CM

## 2017-11-12 DIAGNOSIS — F319 Bipolar disorder, unspecified: Secondary | ICD-10-CM | POA: Diagnosis not present

## 2017-11-12 DIAGNOSIS — Z634 Disappearance and death of family member: Secondary | ICD-10-CM | POA: Diagnosis not present

## 2017-11-12 DIAGNOSIS — Z818 Family history of other mental and behavioral disorders: Secondary | ICD-10-CM

## 2017-11-12 MED ORDER — BUPROPION HCL ER (SR) 100 MG PO TB12: 100.0000 mg | ORAL_TABLET | Freq: Two times a day (BID) | ORAL | 0 refills | Status: DC

## 2017-11-12 MED ORDER — OLANZAPINE 15 MG PO TABS: 15.0000 mg | ORAL_TABLET | Freq: Every day | ORAL | 2 refills | Status: DC

## 2017-11-12 MED ORDER — CLONAZEPAM 0.5 MG PO TABS: ORAL_TABLET | ORAL | 0 refills | Status: DC
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
## 2017-11-13 ENCOUNTER — Ambulatory Visit (INDEPENDENT_AMBULATORY_CARE_PROVIDER_SITE_OTHER): Payer: Medicare Other | Admitting: Licensed Clinical Social Worker

## 2017-11-13 DIAGNOSIS — F4310 Post-traumatic stress disorder, unspecified: Secondary | ICD-10-CM

## 2017-11-13 DIAGNOSIS — F410 Panic disorder [episodic paroxysmal anxiety] without agoraphobia: Secondary | ICD-10-CM

## 2017-11-13 DIAGNOSIS — F319 Bipolar disorder, unspecified: Secondary | ICD-10-CM

## 2017-11-13 DIAGNOSIS — F419 Anxiety disorder, unspecified: Secondary | ICD-10-CM | POA: Diagnosis not present
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
## 2017-12-05 ENCOUNTER — Ambulatory Visit (INDEPENDENT_AMBULATORY_CARE_PROVIDER_SITE_OTHER): Payer: Medicare Other | Admitting: Licensed Clinical Social Worker

## 2017-12-05 DIAGNOSIS — F419 Anxiety disorder, unspecified: Secondary | ICD-10-CM

## 2017-12-05 DIAGNOSIS — F410 Panic disorder [episodic paroxysmal anxiety] without agoraphobia: Secondary | ICD-10-CM | POA: Diagnosis not present

## 2017-12-05 DIAGNOSIS — F4310 Post-traumatic stress disorder, unspecified: Secondary | ICD-10-CM

## 2017-12-05 DIAGNOSIS — F319 Bipolar disorder, unspecified: Secondary | ICD-10-CM

## 2017-12-05 NOTE — Progress Notes (Signed)
   THERAPIST PROGRESS NOTE  Session Time: 9:05 AM to 9:57 AM  Participation Level: Active  Behavioral Response: CasualAlertEuthymic and Irritable at times  Type of Therapy: Individual Therapy  Treatment Goals addressed:  reduce overall level, frequency and intensity of the anxiety so that daily functioning is not impaired, resulting core conflict that is the source of anxiety, learn and apply emotional regulation strategies  Interventions: Solution Focused, Strength-based, Supportive, Reframing and Other: coping  Summary: Cheryl [name] is a 62 y.o. female who presents with Bipolar I disorder, Panic disorder, chronic anxiety, PTSD.   Suicidal/Homicidal: No  Therapist Response: Shares new glasses, makes a big difference, has been struggling with eyesight issues for past year and a half. Discussed feelings related to daughter, describes it as aggravating, patient relates that it feels like she owes her, same old stuff. After last session she had a talk with her and going to have another talk. Daughter has it in her head taht she needs her, patient recognizes this as manipulative and what happens to a person in their addiction. Shares that it can feel hopeless. Staying away from her is a good thing. Considers her boyfriend's house as her home. Patient discussed moving in with boyfriend and her daughter finding low income housing and giving her money to help her when on her own. Discussed that it would help in getting more of boundary that would be good for her. Relates that setting boundaries has helped a little. Shares insight that "things happen slowly". Describes the stress can shut if off but that is always there of worrying about her. Having chester is the one positive thing for her and a big help can get away from it. Therapist discussed that it is healthy for daughter to be on her own and take steps to be independent, to help view encouraging daughter to get her own place as a good thing.   Assessment patient current functioning per report.Continued to help process through patient's feelings related to stressors of daughter's drug addiction continue to problem solve around this issue. Therapist emphasized patient's own plan of facilitating her daughter becoming independent as helpful for her daughter and her own growth as well as patient. Discussed supports is important for patient to get away from stress as well as major source to help her with coping. Continue to emphasize boundaries is helpful for patient. Identified patient getting glasses and being able to see a significant positive change for pack patient. Encouraged helpful coping such as patient's and insight things happen not want we always wanting to happen to help the patience. Provided supportive and strength based intervention.  Plan: Return again in 2 weeks.2.therapist continued to work with patient on healthy coping strategies for current stressors and effective coping for mood regulation  Diagnosis: Axis I:  Bipolar I disorder, Panic disorder, chronic anxiety, PTSD    Axis II: No diagnosis    Cordella Register, LCSW 12/05/2017

## 2017-12-23 ENCOUNTER — Telehealth (HOSPITAL_COMMUNITY): Payer: Self-pay | Admitting: Psychiatry

## 2017-12-23 MED ORDER — CLONAZEPAM 0.5 MG PO TABS: ORAL_TABLET | ORAL | 0 refills | Status: DC

## 2017-12-23 NOTE — Telephone Encounter (Signed)
Pt needs refill on Klonopin sent to [facility]

## 2017-12-23 NOTE — Telephone Encounter (Signed)
Called in a one month supply of Klonopin 0.5mg  to the pharmacy per Dr. De Nurse. Informed patient. Nothing further is needed at this time.

## 2018-01-06 ENCOUNTER — Ambulatory Visit (HOSPITAL_COMMUNITY): Payer: Self-pay | Admitting: Licensed Clinical Social Worker

## 2018-01-06 ENCOUNTER — Other Ambulatory Visit (HOSPITAL_COMMUNITY): Payer: Self-pay | Admitting: Psychiatry

## 2018-01-17 ENCOUNTER — Encounter (HOSPITAL_COMMUNITY): Payer: Self-pay | Admitting: Psychiatry

## 2018-01-17 ENCOUNTER — Ambulatory Visit (INDEPENDENT_AMBULATORY_CARE_PROVIDER_SITE_OTHER): Payer: Medicare Other | Admitting: Psychiatry

## 2018-01-17 DIAGNOSIS — F319 Bipolar disorder, unspecified: Secondary | ICD-10-CM | POA: Diagnosis not present

## 2018-01-17 DIAGNOSIS — F419 Anxiety disorder, unspecified: Secondary | ICD-10-CM

## 2018-01-17 DIAGNOSIS — Z813 Family history of other psychoactive substance abuse and dependence: Secondary | ICD-10-CM

## 2018-01-17 DIAGNOSIS — F4310 Post-traumatic stress disorder, unspecified: Secondary | ICD-10-CM | POA: Diagnosis not present

## 2018-01-17 DIAGNOSIS — R450 Nervousness: Secondary | ICD-10-CM | POA: Diagnosis not present

## 2018-01-17 DIAGNOSIS — F410 Panic disorder [episodic paroxysmal anxiety] without agoraphobia: Secondary | ICD-10-CM

## 2018-01-17 DIAGNOSIS — G894 Chronic pain syndrome: Secondary | ICD-10-CM | POA: Diagnosis not present

## 2018-01-17 DIAGNOSIS — Z811 Family history of alcohol abuse and dependence: Secondary | ICD-10-CM

## 2018-01-17 DIAGNOSIS — Z818 Family history of other mental and behavioral disorders: Secondary | ICD-10-CM

## 2018-01-17 MED ORDER — PREGABALIN 75 MG PO CAPS: 75.0000 mg | ORAL_CAPSULE | Freq: Every day | ORAL | 1 refills | Status: DC

## 2018-01-17 MED ORDER — OLANZAPINE 10 MG PO TABS: 10.0000 mg | ORAL_TABLET | Freq: Every day | ORAL | 2 refills | Status: DC

## 2018-01-17 MED ORDER — CLONAZEPAM 0.5 MG PO TABS: ORAL_TABLET | ORAL | 0 refills | Status: DC
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
## 2018-01-23 ENCOUNTER — Ambulatory Visit (HOSPITAL_COMMUNITY): Payer: Self-pay | Admitting: Licensed Clinical Social Worker

## 2018-02-11 ENCOUNTER — Ambulatory Visit (HOSPITAL_COMMUNITY): Payer: Self-pay | Admitting: Psychiatry

## 2018-02-13 ENCOUNTER — Ambulatory Visit (INDEPENDENT_AMBULATORY_CARE_PROVIDER_SITE_OTHER): Payer: Medicare Other | Admitting: Licensed Clinical Social Worker

## 2018-02-13 DIAGNOSIS — F4310 Post-traumatic stress disorder, unspecified: Secondary | ICD-10-CM

## 2018-02-13 DIAGNOSIS — F410 Panic disorder [episodic paroxysmal anxiety] without agoraphobia: Secondary | ICD-10-CM

## 2018-02-13 DIAGNOSIS — F319 Bipolar disorder, unspecified: Secondary | ICD-10-CM

## 2018-02-13 DIAGNOSIS — F419 Anxiety disorder, unspecified: Secondary | ICD-10-CM
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
## 2018-02-20 ENCOUNTER — Other Ambulatory Visit (HOSPITAL_COMMUNITY): Payer: Self-pay | Admitting: Psychiatry

## 2018-02-20 MED ORDER — CLONAZEPAM 0.5 MG PO TABS: ORAL_TABLET | ORAL | 0 refills | Status: DC

## 2018-02-20 NOTE — Telephone Encounter (Signed)
Pt needs refill on klonopin sent to [facility]

## 2018-02-20 NOTE — Telephone Encounter (Signed)
Per Dr. De Nurse, I called in a one month supply of Clonazepam 0.5mg  to the pharmacy. Informed patient of refill.

## 2018-03-03 ENCOUNTER — Ambulatory Visit (INDEPENDENT_AMBULATORY_CARE_PROVIDER_SITE_OTHER): Payer: Medicare Other | Admitting: Licensed Clinical Social Worker

## 2018-03-03 DIAGNOSIS — F410 Panic disorder [episodic paroxysmal anxiety] without agoraphobia: Secondary | ICD-10-CM

## 2018-03-03 DIAGNOSIS — F4310 Post-traumatic stress disorder, unspecified: Secondary | ICD-10-CM | POA: Diagnosis not present

## 2018-03-03 DIAGNOSIS — F319 Bipolar disorder, unspecified: Secondary | ICD-10-CM

## 2018-03-03 DIAGNOSIS — F419 Anxiety disorder, unspecified: Secondary | ICD-10-CM | POA: Diagnosis not present

## 2018-03-03 NOTE — Progress Notes (Signed)
THERAPIST PROGRESS NOTE  Session Time: 3:00 PM-3:41 PM  Participation Level: Active  Behavioral Response: CasualAlertDysphoric  Type of Therapy: Individual Therapy  Treatment Goals addressed: reduce overall level, frequency and intensity of the anxiety so that daily functioning is not impaired, resulting core conflict that is the source of anxiety, learn and apply emotional regulation strategies  Interventions: CBT, DBT, Strength-based, Supportive, Reframing and Other: provided education about drug and alcohol to help patient in managing stressors  Summary: Cheryl [name] is a 62 y.o. female who presents with Bipolar I disorder, Panic disorder, chronic anxiety, PTSD.  Marland Kitchen   Suicidal/Homicidal: No  Therapist Response: Patient describes severe depression for past three weeks. Describes that her head hurts, falling asleep during the day, can't sleep at night, falls asleep early and wakes up early. Back back hurts and has to walk at night. Recognizes that symptoms are related to worry about her daughter, even though she knows it is not true logically, triggered by feelings she abandoned her. Relates symptoms that she didn't want to do anything, sleep all the time, when up head hurts. Admits that she is  scared what is going to happen next with her daughter. Relates that will be in dreams, reaching out to grab something like something going on. Therapist that the intensity of dreams can relate to stress and anxiety one is experiencing during the day. Another thing going on is that nephew had a terrible accident. Patient relates that she feels sorry for herself and lonely and needs to stop doing that. Therapist related that it is understandable to have these feelings, but explored effective ways to cope that won't feed into worsening of symptoms. Patient relates that it helps to talk to [place], boyfriend, he knows what she is going through, they plan to get away for a couple of days. Therapist pointed  out patient's pattern of being self-critical, and recognizing this pattern help her to get out of it. Patient said did better yesterday, didn't sleep all day, and identified that daughter came to visit and enjoyed herself. Therapist pointed out that she is dealing with things she can't control and the reality is that there are things in life can't control but doesn't mean it doesn't hurt. Patient shared that daughter in addiction feels like "a piece of you is dying" and not being able to do anything about it. Patient plans to "stick to her guns", daughter hurts her but not going to let her know to  let her manipulate her. Patient relates she is going to try to implement strategies to help with depression, recognize that she doesn't want to miss out on her life, and there are aspects of her life to enjoy.  Therapist assess patient current functioning per report. Discuss stressor of worries about daughter who is in her addiction, reinforced patient's insight of recognizing the daughter's life is in her hands, that she can't fix it that enabling makes addiction worse, that setting boundaries could be helpful strategies for her daughter. Encouraged patient and things she could do which is to continue to utilize motivational strategies to help develop stronger motivation of her daughter to get well. Encouraged patient to focus on herself, taking care of herself and valuing her life, discussed distraction as helpful coping for emotions, discussed doing the opposite of one's emotion is helpful in changing once emotion. Identified patient's self-criticism is feeding into negative emotions encouraged patient with more supportive and compassionaterelationship with self, not be like her worst enemy but should  be her best support. Challenged patient and negative self talk as not accurate and in fact she has a big heart to care about her daughter. Discussed emotional regulation that emotions helping taking care of ourselves and  difficult emotions are not going to go away unless we allow ourselves to experience a move through them Emotions service to take some sort of action, work on nurturing ourselves through the emotional experience, part of regulating her emotions and to become familiar with what you truly feeling safe can accurately express that experience Provided strength based and supportive intervention  Plan: Return again in 2 weeks.2.continue to work with patient on coping strategies for stressors, mood regulation strategies  Diagnosis: Axis I: Bipolar I disorder, Panic disorder, chronic anxiety, PTSD    Axis II: No diagnosis    Cordella Register, LCSW 03/03/2018

## 2018-03-10 ENCOUNTER — Ambulatory Visit (INDEPENDENT_AMBULATORY_CARE_PROVIDER_SITE_OTHER): Payer: Medicare Other | Admitting: Psychiatry

## 2018-03-10 ENCOUNTER — Encounter (HOSPITAL_COMMUNITY): Payer: Self-pay | Admitting: Psychiatry

## 2018-03-10 ENCOUNTER — Other Ambulatory Visit: Payer: Self-pay

## 2018-03-10 VITALS — BP 118/78 | HR 81 | Ht 67.0 in | Wt 224.0 lb

## 2018-03-10 DIAGNOSIS — Z639 Problem related to primary support group, unspecified: Secondary | ICD-10-CM

## 2018-03-10 DIAGNOSIS — F410 Panic disorder [episodic paroxysmal anxiety] without agoraphobia: Secondary | ICD-10-CM | POA: Diagnosis not present

## 2018-03-10 DIAGNOSIS — F319 Bipolar disorder, unspecified: Secondary | ICD-10-CM

## 2018-03-10 DIAGNOSIS — F4310 Post-traumatic stress disorder, unspecified: Secondary | ICD-10-CM

## 2018-03-10 DIAGNOSIS — F419 Anxiety disorder, unspecified: Secondary | ICD-10-CM | POA: Diagnosis not present

## 2018-03-10 MED ORDER — BUPROPION HCL ER (SR) 100 MG PO TB12: 100.0000 mg | ORAL_TABLET | Freq: Two times a day (BID) | ORAL | 0 refills | Status: DC
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
## 2018-03-25 ENCOUNTER — Other Ambulatory Visit (HOSPITAL_COMMUNITY): Payer: Self-pay | Admitting: Psychiatry

## 2018-03-25 ENCOUNTER — Ambulatory Visit (INDEPENDENT_AMBULATORY_CARE_PROVIDER_SITE_OTHER): Payer: Medicare Other | Admitting: Licensed Clinical Social Worker

## 2018-03-25 DIAGNOSIS — F410 Panic disorder [episodic paroxysmal anxiety] without agoraphobia: Secondary | ICD-10-CM | POA: Diagnosis not present

## 2018-03-25 DIAGNOSIS — F319 Bipolar disorder, unspecified: Secondary | ICD-10-CM | POA: Diagnosis not present

## 2018-03-25 DIAGNOSIS — F4310 Post-traumatic stress disorder, unspecified: Secondary | ICD-10-CM

## 2018-03-25 DIAGNOSIS — F419 Anxiety disorder, unspecified: Secondary | ICD-10-CM

## 2018-03-25 MED ORDER — CLONAZEPAM 0.5 MG PO TABS: ORAL_TABLET | ORAL | 0 refills | Status: DC
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
## 2018-03-25 NOTE — Telephone Encounter (Signed)
Per Dr. De Nurse, I called in a one month supply of Clonazepam 0.5mg  to the pharmacy. Informed patient.

## 2018-03-25 NOTE — Telephone Encounter (Signed)
Pt needs refill on klonopin sent to [facility] pharmacy

## 2018-04-16 ENCOUNTER — Other Ambulatory Visit (HOSPITAL_COMMUNITY): Payer: Self-pay | Admitting: Psychiatry

## 2018-04-16 ENCOUNTER — Ambulatory Visit (INDEPENDENT_AMBULATORY_CARE_PROVIDER_SITE_OTHER): Payer: Medicare Other | Admitting: Licensed Clinical Social Worker

## 2018-04-16 DIAGNOSIS — F419 Anxiety disorder, unspecified: Secondary | ICD-10-CM

## 2018-04-16 DIAGNOSIS — F319 Bipolar disorder, unspecified: Secondary | ICD-10-CM | POA: Diagnosis not present

## 2018-04-16 DIAGNOSIS — F410 Panic disorder [episodic paroxysmal anxiety] without agoraphobia: Secondary | ICD-10-CM

## 2018-04-16 DIAGNOSIS — F4310 Post-traumatic stress disorder, unspecified: Secondary | ICD-10-CM

## 2018-04-16 NOTE — Telephone Encounter (Signed)
Pt needs refill on bupropion sent to [facility] pharmacy

## 2018-04-16 NOTE — Telephone Encounter (Signed)
Can send 

## 2018-04-16 NOTE — Progress Notes (Signed)
THERAPIST PROGRESS NOTE  Session Time: 1:03 PM to 1:55 PM  Participation Level: Active  Behavioral Response: CasualAlertAnxious and Euthymic  Type of Therapy: Individual Therapy  Treatment Goals addressed:  patient learn strategies to reduce anxiety, effectively manage stressors, coping learn mood regulation strategies  Interventions: Solution Focused, Strength-based, Supportive and Other: coping  Summary: Cheryl [name] is a 62 y.o. female who presents with Bipolar I disorder, Panic disorder, chronic anxiety, PTSD.     Suicidal/Homicidal: No  Therapist Response: Patient shares that she worries about daughter who has substance abuse, shares that she is at age where old enough to get her own place and get her life straight. Therapist explored with patient what is behind behaviors and addiction as gaining insight helpful for coping. Patient shares main source of stress is daughter, she worries all the time if she is okay, knows she can't fix it, her daughter has to be the one to change. Shares stressors related to daughter who views this negatively, feels she is on her own and doesn't accept how other people see it. Patient's youngest child is supportive. Discussed if patient can put this aside as younger daughter does and that it would be helpful for her mental health. Patient shares being a mom is feeling like you need to protect your children. Patient has learned she needs to be sterner, that helping her is hurting more than helping. Helps her to be busy, to do things with Cheryl [name], helps to get things off her mind, not hold it in, feels better about things. Cheryl [name] is very helpful, "is inspirational", can to get her mind off it when with Cheryl [name], he went through it with daughter, she talks to him about it helps. Patient knows that if she lets it build up insider he she will go crazy, had two breakdowns, let it build up, went off. Therapist pointed out the significant steps patient has taken in  dealing with daughter, she moved out and not living with her and not helping her with everything. Patient relates significance of her relationship with Cheryl [name], both have a few years left and "we have to enjoy it", from her own life experiences she understands that you have to have a life for yourself can't live life for others,if you don't take time to enjoy life won't have a life. Both she and Cheryl [name] feel they have been given a second chance, patient relates they have had a hard life, and now looking forward for some time to enjoy life. ,Assessed patient current functioning per report and continue to process patient's feelings around stressors of daughter who is her main trigger.reinforced positive steps patient is taking that are consistent with recommendations for families who have substance abusers including setting boundaries, being sterner, separating oneself from the abuser, to not be so severely impacted by their dysfunctional and be able to create a life for self. identified ways making life for herself, recognizing needs to enjoy life, identifying her relationship, playing a big part of that, also provide support and helping her cope with stressor.reinforced insights to help her in coping with daughter including recognizing that she can't fix daughter and change to only happen if her daughter wants it to, also willing to reach out to help her if she is willing to take steps to help herself, wanting to willing to be there to help guide her in positive direction that will help her feel good about herself, that will help that could have a motivational influence on decreasing  substance abuse. Provided supportive and strength-based interventions  Plan: Return again in 2 weeks.2.Therapist work with patient on coping strategies for stressors, coping strategies for regulation of mood  Diagnosis: Axis I: Bipolar I disorder, Panic disorder, chronic anxiety, PTSD    Axis II: No diagnosis    Cordella Register,  LCSW 04/16/2018

## 2018-04-16 NOTE — Telephone Encounter (Signed)
Informed patient that we sent over a 90 day supply on 03/10/18. She will call pharmacy to see if they have it and call me back and let me know if they do not.

## 2018-04-24 ENCOUNTER — Telehealth (HOSPITAL_COMMUNITY): Payer: Self-pay | Admitting: Psychiatry

## 2018-04-24 NOTE — Telephone Encounter (Signed)
Pt needs refill on klonopin sent to [facility] in [place]

## 2018-04-25 ENCOUNTER — Other Ambulatory Visit (HOSPITAL_COMMUNITY): Payer: Self-pay | Admitting: Psychiatry

## 2018-04-25 MED ORDER — CLONAZEPAM 0.5 MG PO TABS: ORAL_TABLET | ORAL | 0 refills | Status: DC

## 2018-04-25 NOTE — Telephone Encounter (Signed)
Called in a one month supply to [facility] after speaking and confirming pharmacy with patient.

## 2018-05-01 ENCOUNTER — Ambulatory Visit (HOSPITAL_COMMUNITY): Payer: Medicare Other | Admitting: Licensed Clinical Social Worker

## 2018-05-22 ENCOUNTER — Ambulatory Visit (INDEPENDENT_AMBULATORY_CARE_PROVIDER_SITE_OTHER): Payer: Medicare Other | Admitting: Licensed Clinical Social Worker

## 2018-05-22 DIAGNOSIS — F4310 Post-traumatic stress disorder, unspecified: Secondary | ICD-10-CM | POA: Diagnosis not present

## 2018-05-22 DIAGNOSIS — F410 Panic disorder [episodic paroxysmal anxiety] without agoraphobia: Secondary | ICD-10-CM

## 2018-05-22 DIAGNOSIS — F319 Bipolar disorder, unspecified: Secondary | ICD-10-CM | POA: Diagnosis not present

## 2018-05-22 DIAGNOSIS — F419 Anxiety disorder, unspecified: Secondary | ICD-10-CM | POA: Diagnosis not present

## 2018-05-22 NOTE — Progress Notes (Signed)
THERAPIST PROGRESS NOTE  Session Time: 10:02 AM to 10:04 AM  Participation Level: Active  Behavioral Response: CasualAlertAnxious  Type of Therapy: Individual Therapy  Treatment Goals addressed:  patient learn strategies to reduce anxiety, effectively manage stressors, coping learn mood regulation strategies  Interventions: CBT, Solution Focused, Strength-based, Supportive, Reframing and Other: substance abuse counseling  Summary: Cheryl [name] is a 62 y.o. female who presents with Bipolar I disorder, Panic disorder, chronic anxiety, PTSD.     Suicidal/Homicidal: No  Therapist Response: Patient checked in and shared stress with her daughter, keeping her stressed, calls her  saying she wants to die, "nerves tied up, doesn't know if I am coming and going, can't sleep. Shares she feels that she is just waiting to kills herself. Shares last week and this weekend severely stressed. Discussed with patient that addicts can emotionally manipulate, recognize this so not to get caught up in it, recognize that she thinks she is helping but only making it worse. Encourage firm boundaries, patient picks up the phone three times a day and discussed patient not picking up the phone, patient needs to take care of herself, with medical issues anxiety has negative impact on her health, detach so not caught up in dynamic of manipulation, recognize daughter is the only one who can change situation, she is the one who has to take reponsiblity of her life. Discussed firm boundaries to the point of condition of not talking with daughter until she gets help. Discussed frustration of mental health system where daughter said she didn't want to live anymore and only kept her for a night. Encouraged patient identifying this she wants to do to enjoy herself and related that she has plans to go to [place] for thee days without phone. Shares that stress with daughter impacts her relationship with boyfriend. Also identifies  ongoing depressive symptoms related to situation with daughter, doesn't want to go anywhere, irritable, doesn't sleep, tired. Patient shares that she has to tell her daughter "I have a life and you do too", going to try to focus on her own life. Discussed news that boyfriend diagnosed with cancer last week that has caused things to be overwhelming. Discussed coping strategies of praying, compartmentalizing, not talking about it with people so she can be strong for him, significance for him that she is his support and this is significant for her too, that she will be there for him. Discussed not jumping to conclusions but waiting to hear back from doctors.  Therapist assess patient current functioning per report and help patient to process through feelings related to stressors with her daughter who continues in her addiction and boyfriend who has been diagnosed with cancer. Work with patient on mindset that would be helpful for coping including detaching from daughter so as not to enable, understanding she is an adult and needs to take care of herself, nothing that patient can do and also priority for patient is to take care of herself. Discussed being an enabler when you think you're helping but only making it worse. Discussed copingfirst stressor with boyfriend, patient uses spirituality, encouraged patient to recognize from diagnosis life is short and to do things she enjoys and also not to follow doctor's direction, not to believe thoughts in her head without backup from facts. Provided supportive and strength-based intervention.  Plan: Return again in 2-3 weeks.2.therapist work with patient on stress management strategies emotional regulation skills  Diagnosis: Axis I:  Bipolar I disorder, Panic disorder, chronic anxiety, PTSD  Axis II: No diagnosis    Cordella Register, LCSW 05/22/2018

## 2018-05-26 ENCOUNTER — Other Ambulatory Visit (HOSPITAL_COMMUNITY): Payer: Self-pay

## 2018-05-26 ENCOUNTER — Telehealth (HOSPITAL_COMMUNITY): Payer: Self-pay

## 2018-05-26 MED ORDER — CLONAZEPAM 0.5 MG PO TABS: ORAL_TABLET | ORAL | 0 refills | Status: DC

## 2018-05-26 NOTE — Telephone Encounter (Signed)
Called in prescription for Clonazepam 0.5mg  to [facility]. Left info on voicemail.

## 2018-05-26 NOTE — Telephone Encounter (Addendum)
Patient is requesting a refill on Klonopin. Last refill was on 04/25/18. She has changed pharmacy to [facility] in [place].

## 2018-05-29 ENCOUNTER — Telehealth (HOSPITAL_COMMUNITY): Payer: Self-pay

## 2018-05-29 ENCOUNTER — Other Ambulatory Visit (HOSPITAL_COMMUNITY): Payer: Self-pay

## 2018-05-29 MED ORDER — CLONAZEPAM 0.5 MG PO TABS: ORAL_TABLET | ORAL | 0 refills | Status: DC

## 2018-05-29 NOTE — Telephone Encounter (Signed)
Patient called Clonazepam 0.5mg  tabs was submitted to wrong pharmacy. Called [place] [facility] left message to [facility] and called in new script to [facility]

## 2018-06-05 ENCOUNTER — Ambulatory Visit (INDEPENDENT_AMBULATORY_CARE_PROVIDER_SITE_OTHER): Payer: Medicare Other | Admitting: Licensed Clinical Social Worker

## 2018-06-05 DIAGNOSIS — F4310 Post-traumatic stress disorder, unspecified: Secondary | ICD-10-CM

## 2018-06-05 DIAGNOSIS — F319 Bipolar disorder, unspecified: Secondary | ICD-10-CM

## 2018-06-05 DIAGNOSIS — F410 Panic disorder [episodic paroxysmal anxiety] without agoraphobia: Secondary | ICD-10-CM

## 2018-06-05 DIAGNOSIS — F419 Anxiety disorder, unspecified: Secondary | ICD-10-CM

## 2018-06-05 NOTE — Progress Notes (Signed)
   THERAPIST PROGRESS NOTE  Session Time: 2:06 PM to 2:53 PM  Participation Level: Active  Behavioral Response: CasualAlertstress, subdued   Type of Therapy: Individual Therapy  Treatment Goals addressed:  patient learn strategies to reduce anxiety, effectively manage stressors, coping learn mood regulation strategies  Interventions: CBT, Solution Focused, Strength-based, Supportive and Other: coping    Summary: Cheryl [name] is a 62 y.o. female who presents with Bipolar I disorder, Panic disorder, chronic anxiety, PTSD  Suicidal/Homicidal: No  Therapist Response: Patient shared that they came from the doctor and discussed boyfriend's treatment that includes 7 weeks of chemotherapy and 6 weeks of radiation. Describes being scared, only thing is to focus on doing what he needs to do for treatment, not project into future but focus on what they have to do now. Discussed only option is pushing through this and doing what they need to do but also they talked and do what they can do, to make the most of the time together through cancer treatment. Has outsold daughters that it is their time, not to interfere in their life especially now and what they are going through, shares she is going to be firm about this, they are too old, both have medical issues, it is their time and need time to focus on their lives as realize they are getting older and don't know how long they have to live, not going to wait around. Shares stress and pressure from Chester's medical issues and also pressure from outside, explains especially that it makes her angry that his family comes to him only for money. Has tried to talk to him but he gets upset, tries to not think about it. Shares symptoms of stress, but also tired, tried of dealing with all of this, has to push through it, she recognizes that she will be okay because she is a survivor, she has been through so much and this is one more test.  Therapist assessed patient  current functioning per report, process patient's feelings related to boyfriend's diagnosis of cancer and upcoming treatments. Discussed coping strategies including not projecting into the future, focus on present, following treatment protocol recommended by doctor. Provided positive feedback for patient's perspective of doing that they can together, to appreciate and make the most of their time as future uncertain for everyone, value the time they do have together. Provided positive feedback for patient setting firm boundaries with kids along with insight that they need to focus on their own lives as they are getting older and have medical issues. Reinforced patient's insight as to her own resiliency. Helped patient to process feelings related to boyfriend's family who just take money from him, discussed being a good support for boyfriend is speaking out about supporting him right now, also talked about recognizing she did what she could be speaking out, but also recognizing what is and is not in her control. Provided supportive and strength based intervention.    Plan: Return again in 2. Weeks.2.Continue to help patient in processing feelings related to stressors and help in coping with stressors.  Diagnosis: Axis I:  Bipolar I disorder, Panic disorder, chronic anxiety, PTSD    Axis II: No diagnosis    Cordella Register, LCSW 06/05/2018

## 2018-06-30 ENCOUNTER — Telehealth (HOSPITAL_COMMUNITY): Payer: Self-pay

## 2018-06-30 NOTE — Telephone Encounter (Signed)
Patient boyfriend called requesting a refill on Klonopin 0.5mg  . Please send refill to [facility] in [place]

## 2018-07-01 ENCOUNTER — Other Ambulatory Visit (HOSPITAL_COMMUNITY): Payer: Self-pay

## 2018-07-01 MED ORDER — CLONAZEPAM 0.5 MG PO TABS: ORAL_TABLET | ORAL | 0 refills | Status: DC

## 2018-07-01 NOTE — Telephone Encounter (Signed)
Called medication in to the pharmacy and informed patient. I also informed patient that she needs to be the only one calling in her medication refills unless there is a release on file. Nothing further is needed at this time.

## 2018-07-01 NOTE — Telephone Encounter (Signed)
Will send but call patient if she is in need. BF should not be calling unless has power of attorney/permission

## 2018-07-01 NOTE — Telephone Encounter (Signed)
Called patient left voicemail message for patient to return phone call to discuss message from doctor

## 2018-07-03 ENCOUNTER — Ambulatory Visit (INDEPENDENT_AMBULATORY_CARE_PROVIDER_SITE_OTHER): Payer: Medicare Other | Admitting: Licensed Clinical Social Worker

## 2018-07-03 DIAGNOSIS — F410 Panic disorder [episodic paroxysmal anxiety] without agoraphobia: Secondary | ICD-10-CM

## 2018-07-03 DIAGNOSIS — F319 Bipolar disorder, unspecified: Secondary | ICD-10-CM | POA: Diagnosis not present

## 2018-07-03 DIAGNOSIS — F4310 Post-traumatic stress disorder, unspecified: Secondary | ICD-10-CM

## 2018-07-03 DIAGNOSIS — F419 Anxiety disorder, unspecified: Secondary | ICD-10-CM

## 2018-07-03 NOTE — Progress Notes (Addendum)
THERAPIST PROGRESS NOTE  Session Time: 2:05 PM to 2:57 PM  Participation Level: Active  Behavioral Response: CasualAlertEuthymic, at times anxiety related to stressor  Type of Therapy: Individual Therapy  Treatment Goals addressed:  learn strategies to reduce anxiety, continue to progress in applying effective coping strategies and strategies to maintain stability wit mood.   Interventions: Solution Focused, Strength-based, Supportive and Other: coping  Summary: Cheryl [name] is a 62 y.o. female who presents with Bipolar I disorder, Panic disorder, chronic anxiety, PTSD   Suicidal/Homicidal: No  Therapist Response: Patient shares that she is tired has not slept well with worries of boyfriend, [name]. He is getting treatment with chemotherapy and radiation and it is taking a toll on him that takes a toll on her.  Describes thoughts such as whether he will have to go through this again, whether he is going to go through the treatment again, whether he is going to get sick. she tries to help and he gets offended, she explains she just wants him to rest, believes that he takes to mean that he cannot do what he used to do, thinks he feels "less than" related to how this treatment has impacted his functioning, shares he was in the [employer] and his steps approaches nothing will bring him down.  Patient is aware and tries to remind him that they are older and cannot do what they used to do, that anyone going through what he is going through would feel the way he does and that he has to accept that.  Set boundaries with daughter, daughter doing better, has been firm with her, not going to help her anymore and she needs to stand on her two feet.  Patient relates that "they do not need any aggravation and not going to happen"..  They do have time to enjoy together, do not know what is going to happen, having fun, as they need to live to the fullest as both of them have health issues.  Discussed patient  starting therapy wanting a relationship and she relates that "it worked out".  She never had the life that she has with him.  Shares anger toward grandchildren who are not treating him right, not right with what he is going through, he is there to protect her and she will stand up if he is being mistreated.  Shares she is trying to stay strong, she can be a good support for him tries to make him laugh laugh and get his mind off of stuff, they are good for each other.  Patient describes becoming stronger since she started therapy Therapist to assess patient current functioning per report.  Process patient's feelings related to current stressors and discussed coping with stressors.  Reinforced patient's attitude and coping of recognizing needing to not take life for granted and make most of her time.  Reviewed treatment plan and patient reports doing better, describes herself as stronger, able to set better boundaries in her relationships.  Discussed that her partner is a good support and helps her with coping.  Will decrease therapy sessions as patient has made progress and will now have once monthly.  Provided supportive and strength-based intervention Plan: 1.to decrease sessions to monthly due to progress.2.  Patient to continue to make progress in maintaining stability with mood and coping with stressors  Diagnosis: Axis I:  Bipolar I disorder, Panic disorder, chronic anxiety, PTSD    Axis II: No diagnosis    Cordella Register, LCSW 07/03/2018

## 2018-07-08 ENCOUNTER — Ambulatory Visit (HOSPITAL_COMMUNITY): Payer: Self-pay | Admitting: Psychiatry

## 2018-07-09 ENCOUNTER — Ambulatory Visit (INDEPENDENT_AMBULATORY_CARE_PROVIDER_SITE_OTHER): Payer: Medicare Other | Admitting: Psychiatry

## 2018-07-09 ENCOUNTER — Encounter (HOSPITAL_COMMUNITY): Payer: Self-pay | Admitting: Psychiatry

## 2018-07-09 VITALS — BP 124/68 | HR 81 | Ht 67.0 in | Wt 224.0 lb

## 2018-07-09 DIAGNOSIS — F410 Panic disorder [episodic paroxysmal anxiety] without agoraphobia: Secondary | ICD-10-CM

## 2018-07-09 DIAGNOSIS — F4310 Post-traumatic stress disorder, unspecified: Secondary | ICD-10-CM

## 2018-07-09 DIAGNOSIS — F419 Anxiety disorder, unspecified: Secondary | ICD-10-CM | POA: Diagnosis not present

## 2018-07-09 DIAGNOSIS — G894 Chronic pain syndrome: Secondary | ICD-10-CM

## 2018-07-09 DIAGNOSIS — F319 Bipolar disorder, unspecified: Secondary | ICD-10-CM

## 2018-07-09 MED ORDER — BUPROPION HCL ER (SR) 100 MG PO TB12: 100.0000 mg | ORAL_TABLET | Freq: Two times a day (BID) | ORAL | 0 refills | Status: DC

## 2018-07-09 MED ORDER — OLANZAPINE 2.5 MG PO TABS
2.5000 mg | ORAL_TABLET | Freq: Every day | ORAL | 1 refills | Status: DC
Start: 2018-07-09 — End: 2018-08-26

## 2018-07-09 NOTE — Progress Notes (Signed)
[facility] Outpatient Follow visit   Patient Identification: Cheryl [name] MRN:  [id number] Date of Evaluation:  07/09/2018 Referral Source:  Transfer from [name] Chief Complaint:   Chief Complaint    Follow-up; Other     Visit Diagnosis:    ICD-10-CM   1. Bipolar 1 disorder (HCC) F31.9   2. Panic disorder F41.0   3. Chronic anxiety F41.9   4. PTSD (post-traumatic stress disorder) F43.10   5. Chronic pain syndrome G89.4     History of Present Illness:  62  years old living with daughter. Diagnosed with bipolar, PTSD and anxiety  Patient has been transferred from [name]. Has multiple medical and deaths in family Stopped taking olanzapine says was feeling foggy during the day was on 10mg .  BF diagnosed with lung cancer. Takes care of him Mood is balanced.  Less flashbacks from the past Takes klonopine prn for anxiety  Aggravating factor: deaths in family . BF has cancer  Multiple pain conditions Modifhying factor: boyfriend Duration more then  10 years plus     Past Psychiatric History: mood symptoms. Bipolar, ptsd Multiple hospitalizations in past. Last one year ago. History of suicide attempts and hopelessness   Previous Psychotropic Medications: Yes   Substance Abuse History in the last 12 months:  No.  Consequences of Substance Abuse: NA  Past Medical History:  Past Medical History:  Diagnosis Date  . Anxiety   . Arthritis    back, hands  . Bipolar affective ([place])   . Blood transfusion   . Depression   . Diabetes mellitus   . Headache(784.0)   . High blood pressure   . High cholesterol   . History of DVT of lower extremity    left, after pregnancy  . Hypothyroidism   . Pneumonia    11/2011  . Recurrent upper respiratory infection (URI)   . Schizophrenia ([place])   . Shortness of breath    with exertion  . Suicidal ideation   . Thyroid disease     Past Surgical History:  Procedure Laterality Date  . APPENDECTOMY    .  ESOPHAGOGASTRODUODENOSCOPY  11/23/2011   Procedure: ESOPHAGOGASTRODUODENOSCOPY (EGD);  Surgeon: Landry Dyke, MD;  Location: [facility];  Service: Endoscopy;  Laterality: N/A;  . TUBAL LIGATION      Family Psychiatric History: Brother : depression. He shot himself Daughter : bipolar Drug and alcohol use in family  Family History:  Family History  Problem Relation Age of Onset  . Diabetes Mother   . Lung cancer Father   . Heart failure Father     Social History:   Social History   Socioeconomic History  . Marital status: Divorced    Spouse name: Not on file  . Number of children: Not on file  . Years of education: Not on file  . Highest education level: Not on file  Occupational History  . Not on file  Social Needs  . Financial resource strain: Not on file  . Food insecurity:    Worry: Not on file    Inability: Not on file  . Transportation needs:    Medical: Not on file    Non-medical: Not on file  Tobacco Use  . Smoking status: Never Smoker  . Smokeless tobacco: Never Used  Substance and Sexual Activity  . Alcohol use: No  . Drug use: No  . Sexual activity: Yes  Lifestyle  . Physical activity:    Days per week: Not on file  Minutes per session: Not on file  . Stress: Not on file  Relationships  . Social connections:    Talks on phone: Not on file    Gets together: Not on file    Attends religious service: Not on file    Active member of club or organization: Not on file    Attends meetings of clubs or organizations: Not on file    Relationship status: Not on file  Other Topics Concern  . Not on file  Social History Narrative  . Not on file     Allergies:   Allergies  Allergen Reactions  . Sulfa Antibiotics Rash    Metabolic Disorder Labs: Lab Results  Component Value Date   HGBA1C 7.2 (H) 09/20/2012   MPG 160 (H) 09/20/2012   MPG 151 (H) 11/17/2011   No results found for: PROLACTIN Lab Results  Component Value Date   CHOL 150  11/18/2011   TRIG 169 (H) 11/18/2011   HDL 30 (L) 11/18/2011   CHOLHDL 5.0 11/18/2011   VLDL 34 11/18/2011   LDLCALC 86 11/18/2011   LDLCALC 96 06/13/2011     Current Medications: Current Outpatient Medications  Medication Sig Dispense Refill  . acyclovir (ZOVIRAX) 400 MG tablet     . albuterol (PROVENTIL HFA;VENTOLIN HFA) 108 (90 BASE) MCG/ACT inhaler Inhale 2 puffs into the lungs every 6 (six) hours as needed for wheezing.    Marland Kitchen amitriptyline (ELAVIL) 10 MG tablet     . atenolol (TENORMIN) 100 MG tablet     . atenolol (TENORMIN) 25 MG tablet     . buPROPion (WELLBUTRIN SR) 100 MG 12 hr tablet Take 1 tablet (100 mg total) by mouth 2 (two) times daily. 180 tablet 0  . clonazePAM (KLONOPIN) 0.5 MG tablet bid 60 tablet 0  . dicyclomine (BENTYL) 20 MG tablet Take 20 mg by mouth every morning.    . docusate sodium (COLACE) 100 MG capsule Take 100 mg by mouth 2 (two) times daily.     . fluticasone (FLONASE) 50 MCG/ACT nasal spray Place 2 sprays into the nose daily as needed for allergies.    Marland Kitchen HYDROcodone-acetaminophen (NORCO) 10-325 MG tablet     . insulin NPH-insulin regular (NOVOLIN 70/30) (70-30) 100 UNIT/ML injection Inject 20-26 Units into the skin 2 (two) times daily. He takes 26 units at 7am and 20 units at 7pm.    . ketorolac (ACULAR) 0.5 % ophthalmic solution     . levothyroxine (SYNTHROID, LEVOTHROID) 150 MCG tablet Take 150 mcg by mouth daily before breakfast.     . lidocaine (LIDODERM) 5 % Place 1 patch onto the skin daily as needed (For pain.). Remove & Discard patch within 12 hours or as directed by MD    . lisinopril (PRINIVIL,ZESTRIL) 10 MG tablet     . lovastatin (MEVACOR) 20 MG tablet Take 20 mg by mouth every morning.    . meloxicam (MOBIC) 7.5 MG tablet     . metFORMIN (GLUCOPHAGE) 1000 MG tablet     . Multiple Vitamin (MULITIVITAMIN WITH MINERALS) TABS Take 1 tablet by mouth daily with lunch.     . OLANZapine (ZYPREXA) 2.5 MG tablet Take 1 tablet (2.5 mg total) by  mouth at bedtime. 30 tablet 1  . polyethylene glycol (MIRALAX / GLYCOLAX) packet Take 17-34 g by mouth every morning.      No current facility-administered medications for this visit.       Psychiatric Specialty Exam: Review of Systems  Cardiovascular: Negative for chest  pain.  Skin: Negative for rash.  Psychiatric/Behavioral: Negative for depression, substance abuse and suicidal ideas.    Blood pressure 124/68, pulse 81, height 5\' 7"  (1.702 m), weight 224 lb (101.6 kg).Body mass index is 35.08 kg/m.  General Appearance: Casual  Eye Contact:  Fair  Speech:  Normal Rate  Volume:  Decreased  Mood: fair  Affect:  constricted  Thought Process:  Goal Directed and Descriptions of Associations: Intact  Orientation:  Full (Time, Place, and Person)  Thought Content:  Rumination  Suicidal Thoughts:  No  Homicidal Thoughts:  No  Memory:  Immediate;   Fair Recent;   Fair  Judgement:  Fair  Insight:  Shallow  Psychomotor Activity:  Normal  Concentration:  Concentration: Fair and Attention Span: Fair  Recall:  [employer] of Knowledge:Fair  Language: Good  Akathisia:  No  Handed:  Right  AIMS (if indicated):    Assets:  Desire for Improvement  ADL's:  Intact  Cognition: WNL  Sleep:  Fair on meds    Treatment Plan Summary: Medication management and Plan as follows   1. Bipolar disorder, mixed or depressed: doing fair. Has stopped olanzapine was feeling groggy. Will re instate low dose of 2.5mg  qhs   2. Panic disorder; infrequent. Continue klonopine. Sometime skip doses   3. PTSD: triggers remind of past. Continue therapy and klonopin  Fu 3 months.   Merian Capron, MD 10/30/20191:48 PM

## 2018-07-28 ENCOUNTER — Other Ambulatory Visit (HOSPITAL_COMMUNITY): Payer: Self-pay | Admitting: Psychiatry

## 2018-07-28 MED ORDER — CLONAZEPAM 0.5 MG PO TABS: ORAL_TABLET | ORAL | 0 refills | Status: DC

## 2018-07-28 NOTE — Telephone Encounter (Signed)
Last refill on 10/22

## 2018-07-28 NOTE — Telephone Encounter (Signed)
Pt needs refill on klonopin sent to [facility] in [place]

## 2018-07-28 NOTE — Telephone Encounter (Signed)
klonopine sent but should be due by 11/22. Pharmacy may not give early refill

## 2018-08-04 ENCOUNTER — Ambulatory Visit (INDEPENDENT_AMBULATORY_CARE_PROVIDER_SITE_OTHER): Payer: Medicare Other | Admitting: Licensed Clinical Social Worker

## 2018-08-04 DIAGNOSIS — F419 Anxiety disorder, unspecified: Secondary | ICD-10-CM | POA: Diagnosis not present

## 2018-08-04 DIAGNOSIS — F4310 Post-traumatic stress disorder, unspecified: Secondary | ICD-10-CM | POA: Diagnosis not present

## 2018-08-04 DIAGNOSIS — F319 Bipolar disorder, unspecified: Secondary | ICD-10-CM | POA: Diagnosis not present

## 2018-08-04 DIAGNOSIS — F410 Panic disorder [episodic paroxysmal anxiety] without agoraphobia: Secondary | ICD-10-CM

## 2018-08-04 NOTE — Progress Notes (Signed)
   THERAPIST PROGRESS NOTE  Session Time: 3:00 PM to 3:40 PM  Participation Level: Active  Behavioral Response: CasualAlertIrritable and frustrated  Type of Therapy: Individual Therapy  Treatment Goals addressed: learn strategies to reduce anxiety, continue to progress in applying effective coping strategies and strategies to maintain stability wit mood.   Interventions: Solution Focused, Strength-based, Supportive and Other: stress management  Summary: Cheryl [name] is a 62 y.o. female who presents with sharing at home her boyfriend, always  grouchy, complaining, talk to her, is miserable.  Patient has tried to talk to him but he does not see that he is treating her this way.  Describes no intimacy. patient describes very aggravated as she does everything for him, does not show appreciation but is always looking out for family members who do nothing to help out.  Patient is not sleeping, with all this on her mind.  Discussed that stress is not good for health issues, discuss needing a strategy to help decrease her stress, explored different options including emotional strategies, cognitive strategies.  Patient shares best thing for her to cope is getting away from him, uses television as a distraction, but the same time staying close enough if he needs anything.  There is worries that he said he would take care of her and now feels he is backing down on promises and does not know what happened to her if something happens to him.  Thus there is only somewhat she can take.  Discussed that boyfriend is going through a lot including looking at his mortality, utilize strategy of acceptance, and see if things improve once off of cancer treatment.  Discussed as well talking to Dr. that meds for his mental health are not working.  Wonders at what point will she consider this enough, not continue to be mistreated.  Dates will give this some time understanding attitude and behaviors impacted by cancer treatment.   Therapist continue to encourage patient to share with boyfriend how she feels make it clear that he is negative toward patient.   Therapist to assess patient current functioning per report and help patient to process feelings related to significant stressors to help with coping.  Encourage communicating how she feels with boyfriend, increasing his self-awareness, also recognizing that he cares about her to help with changing negative behaviors.  His reframing and discussing how behaviors and attitude may be related to current treatment of chemotherapy and radiation and to wait to see if behaviors change after treatment is discontinued.  Also reinforced patient own insight to make sure to take care of herself and if this continues not to stay in relationship where she is mistreated.  Problem solved around stress management, identified distraction for patient as helpful.  Provided supportive and strength-based intervention  Suicidal/Homicidal: No  Plan: Return again in 4 weeks.  2.  Therapist continue to work with patient on stress management, mood regulation strategies  Diagnosis: Axis I: Bipolar I disorder, Panic disorder, chronic anxiety, PTSD    Axis II: No diagnosis    Cordella Register, LCSW 08/04/2018

## 2018-08-26 ENCOUNTER — Encounter (HOSPITAL_COMMUNITY): Payer: Self-pay | Admitting: Psychiatry

## 2018-08-26 ENCOUNTER — Ambulatory Visit (INDEPENDENT_AMBULATORY_CARE_PROVIDER_SITE_OTHER): Payer: Medicare Other | Admitting: Psychiatry

## 2018-08-26 VITALS — BP 146/92 | HR 105 | Ht 67.0 in | Wt 226.0 lb

## 2018-08-26 DIAGNOSIS — F319 Bipolar disorder, unspecified: Secondary | ICD-10-CM

## 2018-08-26 DIAGNOSIS — F419 Anxiety disorder, unspecified: Secondary | ICD-10-CM

## 2018-08-26 DIAGNOSIS — F4310 Post-traumatic stress disorder, unspecified: Secondary | ICD-10-CM

## 2018-08-26 DIAGNOSIS — G894 Chronic pain syndrome: Secondary | ICD-10-CM | POA: Diagnosis not present

## 2018-08-26 MED ORDER — CLONAZEPAM 0.5 MG PO TABS: ORAL_TABLET | ORAL | 1 refills | Status: DC

## 2018-08-26 MED ORDER — OLANZAPINE 7.5 MG PO TABS: 7.5000 mg | ORAL_TABLET | Freq: Every day | ORAL | 1 refills | Status: DC

## 2018-08-26 NOTE — Progress Notes (Signed)
[facility] Outpatient Follow visit   Patient Identification: Cheryl [name] MRN:  [id number] Date of Evaluation:  08/26/2018 Referral Source:  Transfer from [name] Chief Complaint:   Chief Complaint    Follow-up     Visit Diagnosis:    ICD-10-CM   1. Bipolar 1 disorder (HCC) F31.9   2. Chronic anxiety F41.9   3. PTSD (post-traumatic stress disorder) F43.10   4. Chronic pain syndrome G89.4     History of Present Illness:  62  years old living with daughter. Diagnosed with bipolar, PTSD and anxiety  Patient has been transferred from [name]. Has multiple medical and deaths in family  She has stopped taking olanzapine prior to last visit which we did not recommended so we started back on a small dose of 2.5 mg apparently she is feeling irritable agitated and stressed out because of her husband diagnosed and getting treatment for lung cancer and he is demanding  She continues take Klonopin for stress and anxiety Some flashbacks from the past  Aggravating factor: deaths in family . BF has cancer  Multiple pain conditions Modifhying factor: boyfriend but he is sick now Duration more then  10 years plus     Past Psychiatric History: mood symptoms. Bipolar, ptsd Multiple hospitalizations in past. Last one year ago. History of suicide attempts and hopelessness   Previous Psychotropic Medications: Yes   Substance Abuse History in the last 12 months:  No.  Consequences of Substance Abuse: NA  Past Medical History:  Past Medical History:  Diagnosis Date  . Anxiety   . Arthritis    back, hands  . Bipolar affective ([place])   . Blood transfusion   . Depression   . Diabetes mellitus   . Headache(784.0)   . High blood pressure   . High cholesterol   . History of DVT of lower extremity    left, after pregnancy  . Hypothyroidism   . Pneumonia    11/2011  . Recurrent upper respiratory infection (URI)   . Schizophrenia ([place])   . Shortness of breath    with exertion  .  Suicidal ideation   . Thyroid disease     Past Surgical History:  Procedure Laterality Date  . APPENDECTOMY    . ESOPHAGOGASTRODUODENOSCOPY  11/23/2011   Procedure: ESOPHAGOGASTRODUODENOSCOPY (EGD);  Surgeon: Landry Dyke, MD;  Location: [facility];  Service: Endoscopy;  Laterality: N/A;  . TUBAL LIGATION      Family Psychiatric History: Brother : depression. He shot himself Daughter : bipolar Drug and alcohol use in family  Family History:  Family History  Problem Relation Age of Onset  . Diabetes Mother   . Lung cancer Father   . Heart failure Father     Social History:   Social History   Socioeconomic History  . Marital status: Divorced    Spouse name: Not on file  . Number of children: Not on file  . Years of education: Not on file  . Highest education level: Not on file  Occupational History  . Not on file  Social Needs  . Financial resource strain: Not on file  . Food insecurity:    Worry: Not on file    Inability: Not on file  . Transportation needs:    Medical: Not on file    Non-medical: Not on file  Tobacco Use  . Smoking status: Never Smoker  . Smokeless tobacco: Never Used  Substance and Sexual Activity  . Alcohol use: No  .  Drug use: No  . Sexual activity: Yes  Lifestyle  . Physical activity:    Days per week: Not on file    Minutes per session: Not on file  . Stress: Not on file  Relationships  . Social connections:    Talks on phone: Not on file    Gets together: Not on file    Attends religious service: Not on file    Active member of club or organization: Not on file    Attends meetings of clubs or organizations: Not on file    Relationship status: Not on file  Other Topics Concern  . Not on file  Social History Narrative  . Not on file     Allergies:   Allergies  Allergen Reactions  . Sulfa Antibiotics Rash    Metabolic Disorder Labs: Lab Results  Component Value Date   HGBA1C 7.2 (H) 09/20/2012   MPG 160 (H)  09/20/2012   MPG 151 (H) 11/17/2011   No results found for: PROLACTIN Lab Results  Component Value Date   CHOL 150 11/18/2011   TRIG 169 (H) 11/18/2011   HDL 30 (L) 11/18/2011   CHOLHDL 5.0 11/18/2011   VLDL 34 11/18/2011   LDLCALC 86 11/18/2011   LDLCALC 96 06/13/2011     Current Medications: Current Outpatient Medications  Medication Sig Dispense Refill  . acyclovir (ZOVIRAX) 400 MG tablet     . albuterol (PROVENTIL HFA;VENTOLIN HFA) 108 (90 BASE) MCG/ACT inhaler Inhale 2 puffs into the lungs every 6 (six) hours as needed for wheezing.    Marland Kitchen amitriptyline (ELAVIL) 10 MG tablet     . atenolol (TENORMIN) 100 MG tablet     . atenolol (TENORMIN) 25 MG tablet     . buPROPion (WELLBUTRIN SR) 100 MG 12 hr tablet Take 1 tablet (100 mg total) by mouth 2 (two) times daily. 180 tablet 0  . clonazePAM (KLONOPIN) 0.5 MG tablet bid 60 tablet 1  . dicyclomine (BENTYL) 20 MG tablet Take 20 mg by mouth every morning.    . docusate sodium (COLACE) 100 MG capsule Take 100 mg by mouth 2 (two) times daily.     . fluticasone (FLONASE) 50 MCG/ACT nasal spray Place 2 sprays into the nose daily as needed for allergies.    Marland Kitchen HYDROcodone-acetaminophen (NORCO) 10-325 MG tablet     . insulin NPH-insulin regular (NOVOLIN 70/30) (70-30) 100 UNIT/ML injection Inject 20-26 Units into the skin 2 (two) times daily. He takes 26 units at 7am and 20 units at 7pm.    . ketorolac (ACULAR) 0.5 % ophthalmic solution     . levothyroxine (SYNTHROID, LEVOTHROID) 150 MCG tablet Take 150 mcg by mouth daily before breakfast.     . lidocaine (LIDODERM) 5 % Place 1 patch onto the skin daily as needed (For pain.). Remove & Discard patch within 12 hours or as directed by MD    . lisinopril (PRINIVIL,ZESTRIL) 10 MG tablet     . lovastatin (MEVACOR) 20 MG tablet Take 20 mg by mouth every morning.    . meloxicam (MOBIC) 7.5 MG tablet     . metFORMIN (GLUCOPHAGE) 1000 MG tablet     . Multiple Vitamin (MULITIVITAMIN WITH MINERALS)  TABS Take 1 tablet by mouth daily with lunch.     . OLANZapine (ZYPREXA) 7.5 MG tablet Take 1 tablet (7.5 mg total) by mouth at bedtime. 30 tablet 1  . polyethylene glycol (MIRALAX / GLYCOLAX) packet Take 17-34 g by mouth every morning.  No current facility-administered medications for this visit.       Psychiatric Specialty Exam: Review of Systems  Cardiovascular: Negative for chest pain.  Skin: Negative for rash.  Psychiatric/Behavioral: Positive for depression. Negative for substance abuse and suicidal ideas.    Blood pressure (!) 146/92, pulse (!) 105, height 5\' 7"  (1.702 m), weight 226 lb (102.5 kg).Body mass index is 35.4 kg/m.  General Appearance: Casual  Eye Contact:  Fair  Speech:  Normal Rate  Volume:  Decreased  Mood: stressed  Affect:  constricted  Thought Process:  Goal Directed and Descriptions of Associations: Intact  Orientation:  Full (Time, Place, and Person)  Thought Content:  Rumination  Suicidal Thoughts:  No  Homicidal Thoughts:  No  Memory:  Immediate;   Fair Recent;   Fair  Judgement:  Fair  Insight:  Shallow  Psychomotor Activity:  Normal  Concentration:  Concentration: Fair and Attention Span: Fair  Recall:  [employer] of Knowledge:Fair  Language: Good  Akathisia:  No  Handed:  Right  AIMS (if indicated):    Assets:  Desire for Improvement  ADL's:  Intact  Cognition: WNL  Sleep:  Fair on meds    Treatment Plan Summary: Medication management and Plan as follows   1. Bipolar disorder, mixed or depressed subdued getting irritable at times. Increase zyprexa to 7.5mg  qhs for mood symptoms wellbutrin has helped depression will continue current dose  Continue therapy and get home health care for husband as she is feeling stressed as a care giver.  2. Panic disorder; at times. Continue klonopine bid or prn  3. PTSD: triggers remind of past. Continue therapy and klonopin  Fu 4 w but she wants to come in 56m  Merian Capron,  MD 12/17/201911:42 AM

## 2018-09-01 ENCOUNTER — Ambulatory Visit (INDEPENDENT_AMBULATORY_CARE_PROVIDER_SITE_OTHER): Payer: Medicare Other | Admitting: Licensed Clinical Social Worker

## 2018-09-01 DIAGNOSIS — F319 Bipolar disorder, unspecified: Secondary | ICD-10-CM | POA: Diagnosis not present

## 2018-09-01 DIAGNOSIS — F4310 Post-traumatic stress disorder, unspecified: Secondary | ICD-10-CM | POA: Diagnosis not present

## 2018-09-01 DIAGNOSIS — F419 Anxiety disorder, unspecified: Secondary | ICD-10-CM | POA: Diagnosis not present

## 2018-09-01 DIAGNOSIS — F410 Panic disorder [episodic paroxysmal anxiety] without agoraphobia: Secondary | ICD-10-CM | POA: Diagnosis not present

## 2018-09-01 NOTE — Progress Notes (Signed)
THERAPIST PROGRESS NOTE  Session Time: 2:00 PM to 2:50 PM  Participation Level: Active  Behavioral Response: CasualAlertDysphoric and Irritable  Type of Therapy: Individual Therapy  Treatment Goals addressed:  learn strategies to reduce anxiety, continue to progress in applying effective coping strategies and strategies to maintain stability wit mood.   Interventions: Solution Focused, Strength-based, Supportive, Reframing and Other: stress management, coping  Summary: Cheryl [name] is a 62 y.o. female who presents with really depressed, anxous and not sleeping. Describes that she can't stand anybody, don't want to be around anybody, talk to anybody, very irritable.  Therapist explored how much her partner, [name], has to do with symptoms and she admits plays a big part.  He acts like things are okay, forgets things, weak, has outbursts, doesn't talk to her anymore sits at the kitchen table hours.  If she says things about his kids, to stop allowing them to just take from him, letting them stand on her 2 feet that he does not want to talk about it and gets angry. He doesn't realize how hard it is on her. Her own kids get her depressed. Continue to ask her for things, not give her space, and she plans to reinforce boundaries. Partner is going to see psychiatrist.  Lucia Gaskins he does not want to go anywhere without her, patient reluctant to go anywhere because she worries about him a lot.  Discussed importance of getting away time to rejuvenate. Understands that if she feels better about herself that she will feel better.  Discussed recognition of having severe illness as extremely stressful time and it is expected to be difficult, but will change as his condition changes. Frustrated with doctor because there has not been significant changes in her meds even though she has expressed severity of symptoms. Patient shares that " something has got to change", "depressed and don't know what to do". Wants to  stay in bed on Christmas.  Therapist challenged her on this plan is leading to more depression patient says has been disappointed with partners family dinner for past 2 years so it would be worse, therapist respected patient's autonomy and knowing what is best for her considering her options. Patient shares very aggravated and easily irritated.  Discussed partner making decisions not understanding what is best for him in relationships including being too generous with grandchild, not wanting home health care.  Patient relates she can let this continue to go on, will see what next test results bring. If he is not going to change she can't be around him, it will break both their hearts, but can't have another year of putting up with it. Therapist assessed patient current functioning per report and processed her feelings related to severe stressor of her partner having cancer in the negative impact it is had on her in the relationship.  Identify 1 of the sources of anger is that her partner does things for other people in his family and does not set limits with them, but patient is the one taking care of him and does not show the same generosity and appreciation.  Validated patient on how she was feeling, also worked on Occupational psychologist when someone has cancer to expect its going to be a very difficult time but also will not always be like this as his symptoms will change.  Provided positive feedback for patient's motivation to start to make plans to change her situation if things do not get better, not allowing herself to stay  depressed, anxious.  Engaged in problem-solving to address issues such as getting home health care, encourage patient to spend time on her own as necessary to rejuvenate in order to engage in care for her partner.  Continue to encourage patient to set limits with daughters.  Provided strength based on supportive intervention  Suicidal/Homicidal: No  Plan: Return again in 3-4  weeks.2.  Therapist continue to work with patient on stress management, emotional regulation skills  Diagnosis: Axis I:   Bipolar I disorder, Panic disorder, chronic anxiety, PTSD    Axis II: No diagnosis    Cordella Register, LCSW 09/01/2018

## 2018-09-15 ENCOUNTER — Other Ambulatory Visit (HOSPITAL_COMMUNITY): Payer: Self-pay | Admitting: Psychiatry

## 2018-09-25 ENCOUNTER — Telehealth (HOSPITAL_COMMUNITY): Payer: Self-pay | Admitting: Psychiatry

## 2018-09-25 MED ORDER — CLONAZEPAM 0.5 MG PO TABS: ORAL_TABLET | ORAL | 0 refills | Status: DC

## 2018-09-25 NOTE — Telephone Encounter (Signed)
Pt needs refill of klonopin sent to [facility] [place]

## 2018-09-25 NOTE — Telephone Encounter (Signed)
sent 

## 2018-09-29 ENCOUNTER — Ambulatory Visit (INDEPENDENT_AMBULATORY_CARE_PROVIDER_SITE_OTHER): Payer: Medicare Other | Admitting: Licensed Clinical Social Worker

## 2018-09-29 DIAGNOSIS — F4310 Post-traumatic stress disorder, unspecified: Secondary | ICD-10-CM | POA: Diagnosis not present

## 2018-09-29 DIAGNOSIS — F410 Panic disorder [episodic paroxysmal anxiety] without agoraphobia: Secondary | ICD-10-CM | POA: Diagnosis not present

## 2018-09-29 DIAGNOSIS — F419 Anxiety disorder, unspecified: Secondary | ICD-10-CM

## 2018-09-29 DIAGNOSIS — F319 Bipolar disorder, unspecified: Secondary | ICD-10-CM

## 2018-09-29 NOTE — Progress Notes (Signed)
THERAPIST PROGRESS NOTE  Session Time: 1:00 Pm to 1:52 PM  Participation Level: Active  Behavioral Response: CasualAlertAnxious and Irritable  Type of Therapy: Individual Therapy  Treatment Goals addressed:   learn strategies to reduce anxiety, continue to progress in applying effective coping strategies and strategies to maintain stability wit mood.   Interventions: Solution Focused, Strength-based, Supportive and Other: effective interpersonal strategies, strategies to manage anxiety, coping with stress  Summary: Cheryl [name] is a 63 y.o. female who presents with parnter is irritable and hard to put up with. He is irriable with her because of not being in a good mode.  Difficult for her because he does not want her to go anywhere without her and how would be helpful for her to get some time alone.  Difficult because when she is kind to him he gets mad and irritable.  She is able to go to her room and shut the door when she does get upset that helps her calm down.  Discussed underlying reason that boyfriend is like this and patient recognizes the fear he must have with cancer in the unknown, he is getting a double dose of chemo and that changes ones personality and he has always been autonomous, in charge of everything and difficult for this to be taken away from him.  Discussed patient strength of being a good support for him at a time where he really needs it.  She does speak up for herself and therapist noted that since working with patient patient has been more assertive, able to set boundaries and patient agrees. Discussed that standing up for herself helps to take care of herself in the situation.  Shared difficulties with daughters that is hard when they are always after her for something. Setting better boundaries that have been more effective. Has used tough love with daughter who has addiction issues, and discussed that this approach has helped, her daughter has calmed down more.   Discussed medication and feels needs a higher dose of Klonopin, at night has racing thoughts and will get to sleep for couple hours, relates thoughts about the past, worries about her boyfriend. Will ask herself "what is next". Shares that she only takes Klonopin when she needs it has gone a week and only taken 1 pill. stopped medication that was given her for sleep, shares that it is not working and also sluggish the next day. Plans to go to PCP to find an alternative medication. Therapist suggested relaxation therapies at night, patient says it helps her relax when she sees a show she enjoys. Hard to calm down though due to nerves, also with back problems.  Patient concerned about weight and discussed exercise but difficult with her neuropathy and physical issues but she is being in a lot of effort to watch diet. Therapist assessed patient current functioning per report and identified continuing anxiety as main issue but also depression.  Help patient to process feelings to help in coping with stressors.  Identified and provided positive feedback for patient setting firm boundaries in her relationships, speaking up for herself has helped in not putting up with stress from daughters, helping her and not being taken advantage of in her relationship.  Reviewed perspective of her boyfriend going through treatment for cancer is a very difficult time, help patient and looking at things from his perspective to help with coping such as being fearful what could happen with cancer, chemo changing his personality, he being always independent and now feeling  that is being taken away from him.  Explored relaxation strategies that can help her with anxiety, particularly at night.  Identified even if patient cannot go out without boyfriend that there is some help in getting away to her own room when she gets upset as helpful.  Identified boyfriend having some understanding of how difficult can be can have some benefit in at  least he having awareness of being a stressor.  Identified tough love as what has worked with daughter with addiction and patient has seen positive benefits from this approach.  Provided strength based on supportive intervention.  Suicidal/Homicidal: No  Plan: Return again in 3-4 weeks.2.  Therapist continue to work with patient on coping strategies for anxiety, stress  Diagnosis: Axis I:   Bipolar I disorder, Panic disorder, chronic anxiety, PTSD    Axis II: No diagnosis    Cordella Register, LCSW 09/29/2018

## 2018-10-07 ENCOUNTER — Ambulatory Visit (HOSPITAL_COMMUNITY): Payer: Medicare Other | Admitting: Psychiatry

## 2018-10-12 ENCOUNTER — Other Ambulatory Visit (HOSPITAL_COMMUNITY): Payer: Self-pay | Admitting: Psychiatry

## 2018-10-20 ENCOUNTER — Ambulatory Visit (HOSPITAL_COMMUNITY): Payer: Medicare Other | Admitting: Licensed Clinical Social Worker

## 2018-10-23 ENCOUNTER — Ambulatory Visit (INDEPENDENT_AMBULATORY_CARE_PROVIDER_SITE_OTHER): Payer: Medicare Other | Admitting: Psychiatry

## 2018-10-23 ENCOUNTER — Other Ambulatory Visit: Payer: Self-pay

## 2018-10-23 ENCOUNTER — Encounter (HOSPITAL_COMMUNITY): Payer: Self-pay | Admitting: Psychiatry

## 2018-10-23 VITALS — BP 132/78 | HR 65 | Ht 67.0 in | Wt 226.0 lb

## 2018-10-23 DIAGNOSIS — F319 Bipolar disorder, unspecified: Secondary | ICD-10-CM | POA: Diagnosis not present

## 2018-10-23 DIAGNOSIS — F4310 Post-traumatic stress disorder, unspecified: Secondary | ICD-10-CM | POA: Diagnosis not present

## 2018-10-23 DIAGNOSIS — G894 Chronic pain syndrome: Secondary | ICD-10-CM | POA: Diagnosis not present

## 2018-10-23 DIAGNOSIS — F419 Anxiety disorder, unspecified: Secondary | ICD-10-CM

## 2018-10-23 MED ORDER — CLONAZEPAM 0.5 MG PO TABS: ORAL_TABLET | ORAL | 0 refills | Status: DC

## 2018-10-23 NOTE — Progress Notes (Signed)
[facility] Outpatient Follow visit   Patient Identification: Cheryl [name] MRN:  [id number] Date of Evaluation:  10/23/2018 Referral Source:  Transfer from [name] Chief Complaint:   Chief Complaint    Follow-up; Other     Visit Diagnosis:    ICD-10-CM   1. Bipolar 1 disorder (HCC) F31.9   2. Chronic anxiety F41.9   3. PTSD (post-traumatic stress disorder) F43.10   4. Chronic pain syndrome G89.4     History of Present Illness:  63  years old living with daughter. Diagnosed with bipolar, PTSD and anxiety  Patient has been transferred from [name]. Has multiple medical and deaths in family  Mood wise she is doing better since back on 7.5 mg olanzapine.  She is having migraines so she is referred to a migraine doctor.  [name] her husband is not doing well he is on chemo. She feels she cannot lower the Klonopin says she has to take it twice a day otherwise she feels stressed out she is also worried about her 2 daughters  Some flashbacks from the past  Aggravating factor: deaths in family . BF has cancer  Multiple pain conditions Modifhying factor: BF but he is sick too Duration more then  10 years plus    Past Psychiatric History: mood symptoms. Bipolar, ptsd Multiple hospitalizations in past. Last one year ago. History of suicide attempts and hopelessness   Previous Psychotropic Medications: Yes   Substance Abuse History in the last 12 months:  No.  Consequences of Substance Abuse: NA  Past Medical History:  Past Medical History:  Diagnosis Date  . Anxiety   . Arthritis    back, hands  . Bipolar affective ([place])   . Blood transfusion   . Depression   . Diabetes mellitus   . Headache(784.0)   . High blood pressure   . High cholesterol   . History of DVT of lower extremity    left, after pregnancy  . Hypothyroidism   . Pneumonia    11/2011  . Recurrent upper respiratory infection (URI)   . Schizophrenia ([place])   . Shortness of breath    with exertion  .  Suicidal ideation   . Thyroid disease     Past Surgical History:  Procedure Laterality Date  . APPENDECTOMY    . ESOPHAGOGASTRODUODENOSCOPY  11/23/2011   Procedure: ESOPHAGOGASTRODUODENOSCOPY (EGD);  Surgeon: Landry Dyke, MD;  Location: [facility];  Service: Endoscopy;  Laterality: N/A;  . TUBAL LIGATION      Family Psychiatric History: Brother : depression. He shot himself Daughter : bipolar Drug and alcohol use in family  Family History:  Family History  Problem Relation Age of Onset  . Diabetes Mother   . Lung cancer Father   . Heart failure Father     Social History:   Social History   Socioeconomic History  . Marital status: Divorced    Spouse name: Not on file  . Number of children: Not on file  . Years of education: Not on file  . Highest education level: Not on file  Occupational History  . Not on file  Social Needs  . Financial resource strain: Not on file  . Food insecurity:    Worry: Not on file    Inability: Not on file  . Transportation needs:    Medical: Not on file    Non-medical: Not on file  Tobacco Use  . Smoking status: Never Smoker  . Smokeless tobacco: Never Used  Substance and  Sexual Activity  . Alcohol use: No  . Drug use: No  . Sexual activity: Yes  Lifestyle  . Physical activity:    Days per week: Not on file    Minutes per session: Not on file  . Stress: Not on file  Relationships  . Social connections:    Talks on phone: Not on file    Gets together: Not on file    Attends religious service: Not on file    Active member of club or organization: Not on file    Attends meetings of clubs or organizations: Not on file    Relationship status: Not on file  Other Topics Concern  . Not on file  Social History Narrative  . Not on file     Allergies:   Allergies  Allergen Reactions  . Sulfa Antibiotics Rash    Metabolic Disorder Labs: Lab Results  Component Value Date   HGBA1C 7.2 (H) 09/20/2012   MPG 160 (H)  09/20/2012   MPG 151 (H) 11/17/2011   No results found for: PROLACTIN Lab Results  Component Value Date   CHOL 150 11/18/2011   TRIG 169 (H) 11/18/2011   HDL 30 (L) 11/18/2011   CHOLHDL 5.0 11/18/2011   VLDL 34 11/18/2011   LDLCALC 86 11/18/2011   LDLCALC 96 06/13/2011     Current Medications: Current Outpatient Medications  Medication Sig Dispense Refill  . acyclovir (ZOVIRAX) 400 MG tablet     . albuterol (PROVENTIL HFA;VENTOLIN HFA) 108 (90 BASE) MCG/ACT inhaler Inhale 2 puffs into the lungs every 6 (six) hours as needed for wheezing.    Marland Kitchen amitriptyline (ELAVIL) 10 MG tablet     . atenolol (TENORMIN) 100 MG tablet     . atenolol (TENORMIN) 25 MG tablet     . buPROPion (WELLBUTRIN SR) 100 MG 12 hr tablet TAKE 1 TABLET(100 MG) BY MOUTH TWICE DAILY 180 tablet 0  . clonazePAM (KLONOPIN) 0.5 MG tablet bid 60 tablet 0  . dicyclomine (BENTYL) 20 MG tablet Take 20 mg by mouth every morning.    . docusate sodium (COLACE) 100 MG capsule Take 100 mg by mouth 2 (two) times daily.     . fluticasone (FLONASE) 50 MCG/ACT nasal spray Place 2 sprays into the nose daily as needed for allergies.    Marland Kitchen HYDROcodone-acetaminophen (NORCO) 10-325 MG tablet     . insulin NPH-insulin regular (NOVOLIN 70/30) (70-30) 100 UNIT/ML injection Inject 20-26 Units into the skin 2 (two) times daily. He takes 26 units at 7am and 20 units at 7pm.    . ketorolac (ACULAR) 0.5 % ophthalmic solution     . levothyroxine (SYNTHROID, LEVOTHROID) 150 MCG tablet Take 150 mcg by mouth daily before breakfast.     . lidocaine (LIDODERM) 5 % Place 1 patch onto the skin daily as needed (For pain.). Remove & Discard patch within 12 hours or as directed by MD    . lisinopril (PRINIVIL,ZESTRIL) 10 MG tablet     . lovastatin (MEVACOR) 20 MG tablet Take 20 mg by mouth every morning.    . meloxicam (MOBIC) 7.5 MG tablet     . metFORMIN (GLUCOPHAGE) 1000 MG tablet     . Multiple Vitamin (MULITIVITAMIN WITH MINERALS) TABS Take 1 tablet  by mouth daily with lunch.     . OLANZapine (ZYPREXA) 7.5 MG tablet Take 1 tablet (7.5 mg total) by mouth at bedtime. 30 tablet 1  . polyethylene glycol (MIRALAX / GLYCOLAX) packet Take 17-34 g by mouth  every morning.      No current facility-administered medications for this visit.       Psychiatric Specialty Exam: Review of Systems  Cardiovascular: Negative for palpitations.  Skin: Negative for rash.  Neurological: Positive for headaches.  Psychiatric/Behavioral: Negative for substance abuse and suicidal ideas.    Blood pressure 132/78, pulse 65, height 5\' 7"  (1.702 m), weight 226 lb (102.5 kg).Body mass index is 35.4 kg/m.  General Appearance: Casual  Eye Contact:  Fair  Speech:  Normal Rate  Volume:  Decreased  Mood: somewhat subdued  Affect:  constricted  Thought Process:  Goal Directed and Descriptions of Associations: Intact  Orientation:  Full (Time, Place, and Person)  Thought Content:  Rumination  Suicidal Thoughts:  No  Homicidal Thoughts:  No  Memory:  Immediate;   Fair Recent;   Fair  Judgement:  Fair  Insight:  Shallow  Psychomotor Activity:  Normal  Concentration:  Concentration: Fair and Attention Span: Fair  Recall:  [employer] of Knowledge:Fair  Language: Good  Akathisia:  No  Handed:  Right  AIMS (if indicated):    Assets:  Desire for Improvement  ADL's:  Intact  Cognition: WNL  Sleep:  Fair on meds    Treatment Plan Summary: Medication management and Plan as follows   1. Bipolar disorder, mixed or depressed  Somewhat subdued, continue olanzapine and Wellbutrin.    Continue therapy and get home health care for husband as she is feeling stressed as a care giver.  2. Panic disorder; sporadic, stressed, says cannot lower klonopine  3. PTSD: triggers make her upset, continue current meds and therapy Fu 4 w but she wants to come in 50m  Merian Capron, MD 2/13/202010:36 AM

## 2018-11-20 ENCOUNTER — Telehealth (HOSPITAL_COMMUNITY): Payer: Self-pay

## 2018-11-20 MED ORDER — CLONAZEPAM 0.5 MG PO TABS: ORAL_TABLET | ORAL | 0 refills | Status: DC

## 2018-11-20 NOTE — Telephone Encounter (Signed)
Patient called requesting a refill on Clonazepam. Last refill was on 10/23/18. [facility] in [place]

## 2018-11-20 NOTE — Telephone Encounter (Signed)
klonopine sent

## 2018-12-18 ENCOUNTER — Ambulatory Visit (INDEPENDENT_AMBULATORY_CARE_PROVIDER_SITE_OTHER): Payer: Medicare Other | Admitting: Psychiatry

## 2018-12-18 ENCOUNTER — Encounter (HOSPITAL_COMMUNITY): Payer: Self-pay | Admitting: Psychiatry

## 2018-12-18 DIAGNOSIS — F410 Panic disorder [episodic paroxysmal anxiety] without agoraphobia: Secondary | ICD-10-CM | POA: Diagnosis not present

## 2018-12-18 DIAGNOSIS — F4310 Post-traumatic stress disorder, unspecified: Secondary | ICD-10-CM

## 2018-12-18 DIAGNOSIS — F319 Bipolar disorder, unspecified: Secondary | ICD-10-CM | POA: Diagnosis not present

## 2018-12-18 DIAGNOSIS — Z6372 Alcoholism and drug addiction in family: Secondary | ICD-10-CM

## 2018-12-18 DIAGNOSIS — F419 Anxiety disorder, unspecified: Secondary | ICD-10-CM

## 2018-12-18 MED ORDER — OLANZAPINE 7.5 MG PO TABS: 7.5000 mg | ORAL_TABLET | Freq: Every day | ORAL | 0 refills | Status: DC

## 2018-12-18 MED ORDER — CLONAZEPAM 0.5 MG PO TABS: ORAL_TABLET | ORAL | 0 refills | Status: DC

## 2018-12-18 NOTE — Progress Notes (Signed)
[facility] Outpatient Follow visit  Tele psych visit Patient Identification: Cheryl [name] MRN:  [id number] Date of Evaluation:  12/18/2018 Referral Source:  Transfer from [name] Chief Complaint:    Visit Diagnosis:    ICD-10-CM   1. Bipolar 1 disorder (HCC) F31.9   2. Chronic anxiety F41.9   3. PTSD (post-traumatic stress disorder) F43.10   4. Alcoholism and drug addiction in family Z54.72     History of Present Illness:  63  years old living with daughter. Diagnosed with bipolar, PTSD and anxiety  I connected with Gaetano Net Urias on 12/18/18 at 10:30 AM EDT by telephone and verified that I am speaking with the correct person using two identifiers.   I discussed the limitations, risks, security and privacy concerns of performing an evaluation and management service by telephone and the availability of in person appointments. I also discussed with the patient that there may be a patient responsible charge related to this service. The patient expressed understanding and agreed to proceed.  Patient has been transferred from [name]. Has multiple medical and deaths in family  Mood wise she is doing better on medications tolerating it with no tremors  she feels she cannot lower the Klonopin says she has to take it twice a day   Staying at home due to the covid virus Some flashbacks from the past  Aggravating factor: deaths in family .BF has cancer Modifhying factor: BF but he is sick too Duration more then  10 years plus    Past Psychiatric History: mood symptoms. Bipolar, ptsd Multiple hospitalizations in past. Last one year ago. History of suicide attempts and hopelessness   Previous Psychotropic Medications: Yes   Substance Abuse History in the last 12 months:  No.  Consequences of Substance Abuse: NA  Past Medical History:  Past Medical History:  Diagnosis Date  . Anxiety   . Arthritis    back, hands  . Bipolar affective ([place])   . Blood transfusion   . Depression    . Diabetes mellitus   . Headache(784.0)   . High blood pressure   . High cholesterol   . History of DVT of lower extremity    left, after pregnancy  . Hypothyroidism   . Pneumonia    11/2011  . Recurrent upper respiratory infection (URI)   . Schizophrenia ([place])   . Shortness of breath    with exertion  . Suicidal ideation   . Thyroid disease     Past Surgical History:  Procedure Laterality Date  . APPENDECTOMY    . ESOPHAGOGASTRODUODENOSCOPY  11/23/2011   Procedure: ESOPHAGOGASTRODUODENOSCOPY (EGD);  Surgeon: Landry Dyke, MD;  Location: [facility];  Service: Endoscopy;  Laterality: N/A;  . TUBAL LIGATION      Family Psychiatric History: Brother : depression. He shot himself Daughter : bipolar Drug and alcohol use in family  Family History:  Family History  Problem Relation Age of Onset  . Diabetes Mother   . Lung cancer Father   . Heart failure Father     Social History:   Social History   Socioeconomic History  . Marital status: Divorced    Spouse name: Not on file  . Number of children: Not on file  . Years of education: Not on file  . Highest education level: Not on file  Occupational History  . Not on file  Social Needs  . Financial resource strain: Not on file  . Food insecurity:    Worry: Not on  file    Inability: Not on file  . Transportation needs:    Medical: Not on file    Non-medical: Not on file  Tobacco Use  . Smoking status: Never Smoker  . Smokeless tobacco: Never Used  Substance and Sexual Activity  . Alcohol use: No  . Drug use: No  . Sexual activity: Yes  Lifestyle  . Physical activity:    Days per week: Not on file    Minutes per session: Not on file  . Stress: Not on file  Relationships  . Social connections:    Talks on phone: Not on file    Gets together: Not on file    Attends religious service: Not on file    Active member of club or organization: Not on file    Attends meetings of clubs or organizations: Not on  file    Relationship status: Not on file  Other Topics Concern  . Not on file  Social History Narrative  . Not on file     Allergies:   Allergies  Allergen Reactions  . Sulfa Antibiotics Rash    Metabolic Disorder Labs: Lab Results  Component Value Date   HGBA1C 7.2 (H) 09/20/2012   MPG 160 (H) 09/20/2012   MPG 151 (H) 11/17/2011   No results found for: PROLACTIN Lab Results  Component Value Date   CHOL 150 11/18/2011   TRIG 169 (H) 11/18/2011   HDL 30 (L) 11/18/2011   CHOLHDL 5.0 11/18/2011   VLDL 34 11/18/2011   LDLCALC 86 11/18/2011   LDLCALC 96 06/13/2011     Current Medications: Current Outpatient Medications  Medication Sig Dispense Refill  . acyclovir (ZOVIRAX) 400 MG tablet     . albuterol (PROVENTIL HFA;VENTOLIN HFA) 108 (90 BASE) MCG/ACT inhaler Inhale 2 puffs into the lungs every 6 (six) hours as needed for wheezing.    Marland Kitchen amitriptyline (ELAVIL) 10 MG tablet     . atenolol (TENORMIN) 100 MG tablet     . atenolol (TENORMIN) 25 MG tablet     . buPROPion (WELLBUTRIN SR) 100 MG 12 hr tablet TAKE 1 TABLET(100 MG) BY MOUTH TWICE DAILY 180 tablet 0  . clonazePAM (KLONOPIN) 0.5 MG tablet bid 60 tablet 0  . dicyclomine (BENTYL) 20 MG tablet Take 20 mg by mouth every morning.    . docusate sodium (COLACE) 100 MG capsule Take 100 mg by mouth 2 (two) times daily.     . fluticasone (FLONASE) 50 MCG/ACT nasal spray Place 2 sprays into the nose daily as needed for allergies.    Marland Kitchen HYDROcodone-acetaminophen (NORCO) 10-325 MG tablet     . insulin NPH-insulin regular (NOVOLIN 70/30) (70-30) 100 UNIT/ML injection Inject 20-26 Units into the skin 2 (two) times daily. He takes 26 units at 7am and 20 units at 7pm.    . ketorolac (ACULAR) 0.5 % ophthalmic solution     . levothyroxine (SYNTHROID, LEVOTHROID) 150 MCG tablet Take 150 mcg by mouth daily before breakfast.     . lidocaine (LIDODERM) 5 % Place 1 patch onto the skin daily as needed (For pain.). Remove & Discard patch  within 12 hours or as directed by MD    . lisinopril (PRINIVIL,ZESTRIL) 10 MG tablet     . lovastatin (MEVACOR) 20 MG tablet Take 20 mg by mouth every morning.    . meloxicam (MOBIC) 7.5 MG tablet     . metFORMIN (GLUCOPHAGE) 1000 MG tablet     . Multiple Vitamin (MULITIVITAMIN WITH MINERALS) TABS  Take 1 tablet by mouth daily with lunch.     . OLANZapine (ZYPREXA) 7.5 MG tablet Take 1 tablet (7.5 mg total) by mouth at bedtime. 30 tablet 0  . polyethylene glycol (MIRALAX / GLYCOLAX) packet Take 17-34 g by mouth every morning.      No current facility-administered medications for this visit.    This is a test treat the   Psychiatric Specialty Exam: Review of Systems  Cardiovascular: Negative for palpitations.  Skin: Negative for rash.  Neurological: Positive for headaches.  Psychiatric/Behavioral: Negative for substance abuse and suicidal ideas.    There were no vitals taken for this visit.There is no height or weight on file to calculate BMI.  General Appearance:  Eye Contact:    Speech:  Normal Rate  Volume:  Decreased  Mood: fair  Affect:  constricted  Thought Process:  Goal Directed and Descriptions of Associations: Intact  Orientation:  Full (Time, Place, and Person)  Thought Content:  Rumination  Suicidal Thoughts:  No  Homicidal Thoughts:  No  Memory:  Immediate;   Fair Recent;   Fair  Judgement:  Fair  Insight:  Shallow  Psychomotor Activity:  Normal  Concentration:  Concentration: Fair and Attention Span: Fair  Recall:  [employer] of Knowledge:Fair  Language: Good  Akathisia:  No  Handed:  Right  AIMS (if indicated):    Assets:  Desire for Improvement  ADL's:  Intact  Cognition: WNL  Sleep:  Fair on meds    Treatment Plan Summary: Medication management and Plan as follows   1. Bipolar disorder, mixed or depressed  Doing fair. Continue olanzapine No tremors 2. Panic disorder; sporadic, stressed, says cannot lower klonopine Will continue and cautioned  about concerns 3. PTSD: triggers make her upset, continue current meds and therapy   I discussed the assessment and treatment plan with the patient. The patient was provided an opportunity to ask questions and all were answered. The patient agreed with the plan and demonstrated an understanding of the instructions.   The patient was advised to call back or seek an in-person evaluation if the symptoms worsen or if the condition fails to improve as anticipated. Fu 62m.  I provided 15 minutes of non-face-to-face time during this encounter. Merian Capron, MD 4/9/20209:59 AM

## 2019-01-16 ENCOUNTER — Telehealth (HOSPITAL_COMMUNITY): Payer: Self-pay | Admitting: Psychiatry

## 2019-01-16 MED ORDER — CLONAZEPAM 0.5 MG PO TABS: ORAL_TABLET | ORAL | 0 refills | Status: DC

## 2019-01-16 NOTE — Telephone Encounter (Signed)
sent 

## 2019-01-16 NOTE — Telephone Encounter (Signed)
Pt needs refill on klonopin sent to [facility] in [place]

## 2019-02-16 ENCOUNTER — Telehealth (HOSPITAL_COMMUNITY): Payer: Self-pay

## 2019-02-16 MED ORDER — CLONAZEPAM 0.5 MG PO TABS: ORAL_TABLET | ORAL | 0 refills | Status: DC

## 2019-02-16 NOTE — Telephone Encounter (Signed)
sent 

## 2019-02-16 NOTE — Telephone Encounter (Signed)
Patient called requesting a refill on Clonazepam. [facility] in [place]

## 2019-02-23 ENCOUNTER — Encounter (HOSPITAL_COMMUNITY): Payer: Self-pay | Admitting: Psychiatry

## 2019-02-23 ENCOUNTER — Other Ambulatory Visit: Payer: Self-pay

## 2019-02-23 ENCOUNTER — Ambulatory Visit (INDEPENDENT_AMBULATORY_CARE_PROVIDER_SITE_OTHER): Payer: Medicare Other | Admitting: Psychiatry

## 2019-02-23 DIAGNOSIS — Z639 Problem related to primary support group, unspecified: Secondary | ICD-10-CM | POA: Diagnosis not present

## 2019-02-23 DIAGNOSIS — F4310 Post-traumatic stress disorder, unspecified: Secondary | ICD-10-CM

## 2019-02-23 DIAGNOSIS — F319 Bipolar disorder, unspecified: Secondary | ICD-10-CM | POA: Diagnosis not present

## 2019-02-23 DIAGNOSIS — F419 Anxiety disorder, unspecified: Secondary | ICD-10-CM | POA: Diagnosis not present

## 2019-02-23 NOTE — Progress Notes (Signed)
[facility] Outpatient Follow visit  Tele psych visit Patient Identification: Cheryl [name] MRN:  [id number] Date of Evaluation:  02/23/2019 Referral Source:  Transfer from [name] Chief Complaint:   Bipolar follow up  Visit Diagnosis:    ICD-10-CM   1. Bipolar 1 disorder (HCC)  F31.9   2. Chronic anxiety  F41.9   3. PTSD (post-traumatic stress disorder)  F43.10   4. Dysfunctional family processes  Z63.9     History of Present Illness:  63   years old living with daughter. Diagnosed with bipolar, PTSD and anxiety I connected with Cheryl [name] on 02/23/19 at  2:00 PM EDT by telephone and verified that I am speaking with the correct person using two identifiers.   I discussed the limitations, risks, security and privacy concerns of performing an evaluation and management service by telephone and the availability of in person appointments. I also discussed with the patient that there may be a patient responsible charge related to this service. The patient expressed understanding and agreed to proceed.   Patient has been transferred from [name]. History of bipolar with multiple medical problems and deaths in family  She is doing fair regarding mood and meds.  klonopine is bid and cannot lower dose. Says gets anxious  Staying at home due to the covid virus Some flashbacks from the past  Aggravating factor: deaths in family .BF has cancer Modifhying factor: BF but he is sick too Duration more then  10 years plus    Past Psychiatric History: mood symptoms. Bipolar, ptsd Multiple hospitalizations in past. Last one year ago. History of suicide attempts and hopelessness   Previous Psychotropic Medications: Yes   Substance Abuse History in the last 12 months:  No.  Consequences of Substance Abuse: NA  Past Medical History:  Past Medical History:  Diagnosis Date  . Anxiety   . Arthritis    back, hands  . Bipolar affective ([place])   . Blood transfusion   . Depression   .  Diabetes mellitus   . Headache(784.0)   . High blood pressure   . High cholesterol   . History of DVT of lower extremity    left, after pregnancy  . Hypothyroidism   . Pneumonia    11/2011  . Recurrent upper respiratory infection (URI)   . Schizophrenia ([place])   . Shortness of breath    with exertion  . Suicidal ideation   . Thyroid disease     Past Surgical History:  Procedure Laterality Date  . APPENDECTOMY    . ESOPHAGOGASTRODUODENOSCOPY  11/23/2011   Procedure: ESOPHAGOGASTRODUODENOSCOPY (EGD);  Surgeon: Landry Dyke, MD;  Location: [facility];  Service: Endoscopy;  Laterality: N/A;  . TUBAL LIGATION      Family Psychiatric History: Brother : depression. He shot himself Daughter : bipolar Drug and alcohol use in family  Family History:  Family History  Problem Relation Age of Onset  . Diabetes Mother   . Lung cancer Father   . Heart failure Father     Social History:   Social History   Socioeconomic History  . Marital status: Divorced    Spouse name: Not on file  . Number of children: Not on file  . Years of education: Not on file  . Highest education level: Not on file  Occupational History  . Not on file  Social Needs  . Financial resource strain: Not on file  . Food insecurity    Worry: Not on file  Inability: Not on file  . Transportation needs    Medical: Not on file    Non-medical: Not on file  Tobacco Use  . Smoking status: Never Smoker  . Smokeless tobacco: Never Used  Substance and Sexual Activity  . Alcohol use: No  . Drug use: No  . Sexual activity: Yes  Lifestyle  . Physical activity    Days per week: Not on file    Minutes per session: Not on file  . Stress: Not on file  Relationships  . Social Herbalist on phone: Not on file    Gets together: Not on file    Attends religious service: Not on file    Active member of club or organization: Not on file    Attends meetings of clubs or organizations: Not on file     Relationship status: Not on file  Other Topics Concern  . Not on file  Social History Narrative  . Not on file     Allergies:   Allergies  Allergen Reactions  . Sulfa Antibiotics Rash    Metabolic Disorder Labs: Lab Results  Component Value Date   HGBA1C 7.2 (H) 09/20/2012   MPG 160 (H) 09/20/2012   MPG 151 (H) 11/17/2011   No results found for: PROLACTIN Lab Results  Component Value Date   CHOL 150 11/18/2011   TRIG 169 (H) 11/18/2011   HDL 30 (L) 11/18/2011   CHOLHDL 5.0 11/18/2011   VLDL 34 11/18/2011   LDLCALC 86 11/18/2011   LDLCALC 96 06/13/2011     Current Medications: Current Outpatient Medications  Medication Sig Dispense Refill  . acyclovir (ZOVIRAX) 400 MG tablet     . albuterol (PROVENTIL HFA;VENTOLIN HFA) 108 (90 BASE) MCG/ACT inhaler Inhale 2 puffs into the lungs every 6 (six) hours as needed for wheezing.    Marland Kitchen atenolol (TENORMIN) 100 MG tablet     . atenolol (TENORMIN) 25 MG tablet     . buPROPion (WELLBUTRIN SR) 100 MG 12 hr tablet TAKE 1 TABLET(100 MG) BY MOUTH TWICE DAILY 180 tablet 0  . clonazePAM (KLONOPIN) 0.5 MG tablet bid 60 tablet 0  . dicyclomine (BENTYL) 20 MG tablet Take 20 mg by mouth every morning.    . docusate sodium (COLACE) 100 MG capsule Take 100 mg by mouth 2 (two) times daily.     . fluticasone (FLONASE) 50 MCG/ACT nasal spray Place 2 sprays into the nose daily as needed for allergies.    Marland Kitchen HYDROcodone-acetaminophen (NORCO) 10-325 MG tablet     . insulin NPH-insulin regular (NOVOLIN 70/30) (70-30) 100 UNIT/ML injection Inject 20-26 Units into the skin 2 (two) times daily. He takes 26 units at 7am and 20 units at 7pm.    . ketorolac (ACULAR) 0.5 % ophthalmic solution     . levothyroxine (SYNTHROID, LEVOTHROID) 150 MCG tablet Take 150 mcg by mouth daily before breakfast.     . lidocaine (LIDODERM) 5 % Place 1 patch onto the skin daily as needed (For pain.). Remove & Discard patch within 12 hours or as directed by MD    . lisinopril  (PRINIVIL,ZESTRIL) 10 MG tablet     . lovastatin (MEVACOR) 20 MG tablet Take 20 mg by mouth every morning.    . meloxicam (MOBIC) 7.5 MG tablet     . metFORMIN (GLUCOPHAGE) 1000 MG tablet     . Multiple Vitamin (MULITIVITAMIN WITH MINERALS) TABS Take 1 tablet by mouth daily with lunch.     . OLANZapine (  ZYPREXA) 7.5 MG tablet Take 1 tablet (7.5 mg total) by mouth at bedtime. 30 tablet 0  . polyethylene glycol (MIRALAX / GLYCOLAX) packet Take 17-34 g by mouth every morning.      No current facility-administered medications for this visit.    This is a test treat the   Psychiatric Specialty Exam: Review of Systems  Cardiovascular: Negative for chest pain.  Skin: Negative for rash.  Psychiatric/Behavioral: Negative for substance abuse and suicidal ideas.    There were no vitals taken for this visit.There is no height or weight on file to calculate BMI.  General Appearance:  Eye Contact:    Speech:  Normal Rate  Volume:  Decreased  Mood: fair  Affect:   Thought Process:  Goal Directed and Descriptions of Associations: Intact  Orientation:  Full (Time, Place, and Person)  Thought Content:  Rumination  Suicidal Thoughts:  No  Homicidal Thoughts:  No  Memory:  Immediate;   Fair Recent;   Fair  Judgement:  Fair  Insight:  Shallow  Psychomotor Activity:  Normal  Concentration:  Concentration: Fair and Attention Span: Fair  Recall:  [employer] of Knowledge:Fair  Language: Good  Akathisia:  No  Handed:  Right  AIMS (if indicated):    Assets:  Desire for Improvement  ADL's:  Intact  Cognition: WNL  Sleep:  Fair on meds    Treatment Plan Summary: Medication management and Plan as follows   1. Bipolar disorder, mixed or depressed doing fair. Continue olanzapine. She is not taking every night, discussed compliance Also on wellbutrin for depression, again not taking regularly, discussed compliance to prevent any worsening of symptoms 2. Panic disorder; sporadic, stressed, says  cannot lower klonopine Will continue and cautioned about concerns 3. PTSD: triggers make her upset, continue current meds and therapy   I discussed the assessment and treatment plan with the patient. The patient was provided an opportunity to ask questions and all were answered. The patient agreed with the plan and demonstrated an understanding of the instructions.   The patient was advised to call back or seek an in-person evaluation if the symptoms worsen or if the condition fails to improve as anticipated. Fu 14m. .  I provided 15 minutes of non-face-to-face time during this encounter. Merian Capron, MD 6/15/20202:08 PM

## 2019-03-17 ENCOUNTER — Telehealth (HOSPITAL_COMMUNITY): Payer: Self-pay

## 2019-03-17 MED ORDER — CLONAZEPAM 0.5 MG PO TABS: ORAL_TABLET | ORAL | 0 refills | Status: DC

## 2019-03-17 NOTE — Telephone Encounter (Signed)
sent 

## 2019-03-17 NOTE — Telephone Encounter (Signed)
Patient called to request a refill on clonazepam. She is completely out and feeling shaky. [facility] in [place].

## 2019-04-15 ENCOUNTER — Telehealth (HOSPITAL_COMMUNITY): Payer: Self-pay | Admitting: Psychiatry

## 2019-04-15 MED ORDER — CLONAZEPAM 0.5 MG PO TABS: ORAL_TABLET | ORAL | 0 refills | Status: DC

## 2019-04-15 NOTE — Telephone Encounter (Signed)
Per Dr. De Nurse, I called in a one month supply.

## 2019-04-15 NOTE — Telephone Encounter (Signed)
Pt needs refill on klonopin sent to [facility] in [place]

## 2019-05-14 ENCOUNTER — Telehealth (HOSPITAL_COMMUNITY): Payer: Self-pay

## 2019-05-14 MED ORDER — CLONAZEPAM 0.5 MG PO TABS: ORAL_TABLET | ORAL | 0 refills | Status: DC

## 2019-05-14 NOTE — Telephone Encounter (Signed)
sent 

## 2019-05-14 NOTE — Telephone Encounter (Signed)
Patient needs refill on Clonazepam sent to [facility] in [place]

## 2019-06-15 ENCOUNTER — Other Ambulatory Visit (HOSPITAL_COMMUNITY): Payer: Self-pay

## 2019-06-15 MED ORDER — CLONAZEPAM 0.5 MG PO TABS: ORAL_TABLET | ORAL | 0 refills | Status: DC

## 2019-06-23 ENCOUNTER — Ambulatory Visit (HOSPITAL_COMMUNITY): Payer: Medicare Other | Admitting: Psychiatry

## 2019-06-25 ENCOUNTER — Ambulatory Visit (INDEPENDENT_AMBULATORY_CARE_PROVIDER_SITE_OTHER): Payer: Medicare Other | Admitting: Psychiatry

## 2019-06-25 ENCOUNTER — Encounter (HOSPITAL_COMMUNITY): Payer: Self-pay | Admitting: Psychiatry

## 2019-06-25 DIAGNOSIS — F4310 Post-traumatic stress disorder, unspecified: Secondary | ICD-10-CM

## 2019-06-25 DIAGNOSIS — Z6372 Alcoholism and drug addiction in family: Secondary | ICD-10-CM

## 2019-06-25 DIAGNOSIS — F419 Anxiety disorder, unspecified: Secondary | ICD-10-CM | POA: Diagnosis not present

## 2019-06-25 DIAGNOSIS — F319 Bipolar disorder, unspecified: Secondary | ICD-10-CM | POA: Diagnosis not present

## 2019-06-25 MED ORDER — OLANZAPINE 7.5 MG PO TABS: 7.5000 mg | ORAL_TABLET | Freq: Every day | ORAL | 1 refills | Status: AC

## 2019-06-25 MED ORDER — BUPROPION HCL ER (SR) 100 MG PO TB12: ORAL_TABLET | ORAL | 0 refills | Status: AC

## 2019-06-25 NOTE — Progress Notes (Signed)
[facility] Outpatient Follow visit  Tele psych visit Patient Identification: Cheryl [name] MRN:  [id number] Date of Evaluation:  06/25/2019 Referral Source:  Transfer from [name] Chief Complaint:   Bipolar follow up  Visit Diagnosis:    ICD-10-CM   1. Bipolar 1 disorder (HCC)  F31.9   2. Chronic anxiety  F41.9   3. PTSD (post-traumatic stress disorder)  F43.10   4. Alcoholism and drug addiction in family  Z83.72     History of Present Illness:  63   years old living with daughter. Diagnosed with bipolar, PTSD and anxiety  I connected with Gaetano Net Rueb on 06/25/19 at 11:00 AM EDT by telephone and verified that I am speaking with the correct person using two identifiers.    I discussed the limitations, risks, security and privacy concerns of performing an evaluation and management service by telephone and the availability of in person appointments. I also discussed with the patient that there may be a patient responsible charge related to this service. The patient expressed understanding and agreed to proceed.   Patient has been transferred from [name]. History of bipolar with multiple medical problems and deaths in family  Doing fair on meds, tolerating, anxiety not worse. Remains calm  Aggravating factor: deaths in family .BF has cancer Modifhying factor: BF but he is sick too Duration more then  10 years plus    Past Psychiatric History: mood symptoms. Bipolar, ptsd Multiple hospitalizations in past. Last one year ago. History of suicide attempts and hopelessness   Previous Psychotropic Medications: Yes   Substance Abuse History in the last 12 months:  No.  Consequences of Substance Abuse: NA  Past Medical History:  Past Medical History:  Diagnosis Date  . Anxiety   . Arthritis    back, hands  . Bipolar affective ([place])   . Blood transfusion   . Depression   . Diabetes mellitus   . Headache(784.0)   . High blood pressure   . High cholesterol   . History  of DVT of lower extremity    left, after pregnancy  . Hypothyroidism   . Pneumonia    11/2011  . Recurrent upper respiratory infection (URI)   . Schizophrenia ([place])   . Shortness of breath    with exertion  . Suicidal ideation   . Thyroid disease     Past Surgical History:  Procedure Laterality Date  . APPENDECTOMY    . ESOPHAGOGASTRODUODENOSCOPY  11/23/2011   Procedure: ESOPHAGOGASTRODUODENOSCOPY (EGD);  Surgeon: Landry Dyke, MD;  Location: [facility];  Service: Endoscopy;  Laterality: N/A;  . TUBAL LIGATION      Family Psychiatric History: Brother : depression. He shot himself Daughter : bipolar Drug and alcohol use in family  Family History:  Family History  Problem Relation Age of Onset  . Diabetes Mother   . Lung cancer Father   . Heart failure Father     Social History:   Social History   Socioeconomic History  . Marital status: Divorced    Spouse name: Not on file  . Number of children: Not on file  . Years of education: Not on file  . Highest education level: Not on file  Occupational History  . Not on file  Social Needs  . Financial resource strain: Not on file  . Food insecurity    Worry: Not on file    Inability: Not on file  . Transportation needs    Medical: Not on file  Non-medical: Not on file  Tobacco Use  . Smoking status: Never Smoker  . Smokeless tobacco: Never Used  Substance and Sexual Activity  . Alcohol use: No  . Drug use: No  . Sexual activity: Yes  Lifestyle  . Physical activity    Days per week: Not on file    Minutes per session: Not on file  . Stress: Not on file  Relationships  . Social Herbalist on phone: Not on file    Gets together: Not on file    Attends religious service: Not on file    Active member of club or organization: Not on file    Attends meetings of clubs or organizations: Not on file    Relationship status: Not on file  Other Topics Concern  . Not on file  Social History Narrative   . Not on file     Allergies:   Allergies  Allergen Reactions  . Sulfa Antibiotics Rash    Metabolic Disorder Labs: Lab Results  Component Value Date   HGBA1C 7.2 (H) 09/20/2012   MPG 160 (H) 09/20/2012   MPG 151 (H) 11/17/2011   No results found for: PROLACTIN Lab Results  Component Value Date   CHOL 150 11/18/2011   TRIG 169 (H) 11/18/2011   HDL 30 (L) 11/18/2011   CHOLHDL 5.0 11/18/2011   VLDL 34 11/18/2011   LDLCALC 86 11/18/2011   LDLCALC 96 06/13/2011     Current Medications: Current Outpatient Medications  Medication Sig Dispense Refill  . acyclovir (ZOVIRAX) 400 MG tablet     . albuterol (PROVENTIL HFA;VENTOLIN HFA) 108 (90 BASE) MCG/ACT inhaler Inhale 2 puffs into the lungs every 6 (six) hours as needed for wheezing.    Marland Kitchen atenolol (TENORMIN) 100 MG tablet     . atenolol (TENORMIN) 25 MG tablet     . buPROPion (WELLBUTRIN SR) 100 MG 12 hr tablet TAKE 1 TABLET(100 MG) BY MOUTH TWICE DAILY 180 tablet 0  . clonazePAM (KLONOPIN) 0.5 MG tablet bid 60 tablet 0  . dicyclomine (BENTYL) 20 MG tablet Take 20 mg by mouth every morning.    . docusate sodium (COLACE) 100 MG capsule Take 100 mg by mouth 2 (two) times daily.     . fluticasone (FLONASE) 50 MCG/ACT nasal spray Place 2 sprays into the nose daily as needed for allergies.    Marland Kitchen HYDROcodone-acetaminophen (NORCO) 10-325 MG tablet     . insulin NPH-insulin regular (NOVOLIN 70/30) (70-30) 100 UNIT/ML injection Inject 20-26 Units into the skin 2 (two) times daily. He takes 26 units at 7am and 20 units at 7pm.    . ketorolac (ACULAR) 0.5 % ophthalmic solution     . levothyroxine (SYNTHROID, LEVOTHROID) 150 MCG tablet Take 150 mcg by mouth daily before breakfast.     . lidocaine (LIDODERM) 5 % Place 1 patch onto the skin daily as needed (For pain.). Remove & Discard patch within 12 hours or as directed by MD    . lisinopril (PRINIVIL,ZESTRIL) 10 MG tablet     . lovastatin (MEVACOR) 20 MG tablet Take 20 mg by mouth every  morning.    . meloxicam (MOBIC) 7.5 MG tablet     . metFORMIN (GLUCOPHAGE) 1000 MG tablet     . Multiple Vitamin (MULITIVITAMIN WITH MINERALS) TABS Take 1 tablet by mouth daily with lunch.     . OLANZapine (ZYPREXA) 7.5 MG tablet Take 1 tablet (7.5 mg total) by mouth at bedtime. 30 tablet 1  .  polyethylene glycol (MIRALAX / GLYCOLAX) packet Take 17-34 g by mouth every morning.      No current facility-administered medications for this visit.    This is a test treat the   Psychiatric Specialty Exam: Review of Systems  Cardiovascular: Negative for chest pain.  Skin: Negative for rash.  Psychiatric/Behavioral: Negative for substance abuse and suicidal ideas.    There were no vitals taken for this visit.There is no height or weight on file to calculate BMI.  General Appearance:  Eye Contact:    Speech:  Normal Rate  Volume:  Decreased  Mood: fair  Affect:   Thought Process:  Goal Directed and Descriptions of Associations: Intact  Orientation:  Full (Time, Place, and Person)  Thought Content:  Rumination  Suicidal Thoughts:  No  Homicidal Thoughts:  No  Memory:  Immediate;   Fair Recent;   Fair  Judgement:  Fair  Insight:  Shallow  Psychomotor Activity:  Normal  Concentration:  Concentration: Fair and Attention Span: Fair  Recall:  [employer] of Knowledge:Fair  Language: Good  Akathisia:  No  Handed:  Right  AIMS (if indicated):    Assets:  Desire for Improvement  ADL's:  Intact  Cognition: WNL  Sleep:  Fair on meds    Treatment Plan Summary: Medication management and Plan as follows   1. Bipolar disorder, mixed or depressed : doing fair, continue olanzapine, discussed compliance with mes Also on wellbutrin for depression, again not taking regularly, discussed compliance to prevent any worsening of symptoms. She was not sure if she needs refill so not sure if she is taking regularly Denies tremors 2. Panic disorder; sporadic, stressed, says cannot lower  klonopine Will continue and cautioned about concerns 3. PTSD: triggers make her upset, continue current meds and therapy   I discussed the assessment and treatment plan with the patient. The patient was provided an opportunity to ask questions and all were answered. The patient agreed with the plan and demonstrated an understanding of the instructions.   The patient was advised to call back or seek an in-person evaluation if the symptoms worsen or if the condition fails to improve as anticipated. Fu 3-4 m. .  I provided 15 minutes of non-face-to-face time during this encounter. Merian Capron, MD 10/15/202011:53 AM

## 2019-07-15 ENCOUNTER — Other Ambulatory Visit (HOSPITAL_COMMUNITY): Payer: Self-pay | Admitting: Psychiatry

## 2019-07-15 NOTE — Telephone Encounter (Signed)
Needs refill on klonopin [facility] on [address]

## 2019-07-16 MED ORDER — CLONAZEPAM 0.5 MG PO TABS: ORAL_TABLET | ORAL | 0 refills | Status: DC

## 2019-07-16 NOTE — Telephone Encounter (Signed)
sent 

## 2019-08-13 ENCOUNTER — Telehealth (HOSPITAL_COMMUNITY): Payer: Self-pay | Admitting: Psychiatry

## 2019-08-13 MED ORDER — CLONAZEPAM 0.5 MG PO TABS: ORAL_TABLET | ORAL | 0 refills | Status: DC

## 2019-08-13 NOTE — Telephone Encounter (Signed)
sent 

## 2019-08-13 NOTE — Telephone Encounter (Signed)
Pt needs refill on klonopin sent to [facility] [address]

## 2019-09-15 ENCOUNTER — Telehealth (HOSPITAL_COMMUNITY): Payer: Self-pay | Admitting: Psychiatry

## 2019-09-15 MED ORDER — CLONAZEPAM 0.5 MG PO TABS: ORAL_TABLET | ORAL | 0 refills | Status: AC

## 2019-09-15 NOTE — Telephone Encounter (Signed)
Pt needs refill on klonopin  [facility]

## 2019-10-27 ENCOUNTER — Ambulatory Visit (HOSPITAL_COMMUNITY): Payer: Medicare Other | Admitting: Psychiatry

## 2022-09-10 DEATH — deceased
